# Patient Record
Sex: Female | Born: 1950 | Race: White | Hispanic: No | Smoking: Never smoker
Health system: Southern US, Community
[De-identification: ages and names within clinical notes are randomized; demographics above are authoritative.]

## PROBLEM LIST (undated history)

## (undated) DIAGNOSIS — F32A Depression, unspecified: Secondary | ICD-10-CM

## (undated) DIAGNOSIS — E785 Hyperlipidemia, unspecified: Secondary | ICD-10-CM

## (undated) DIAGNOSIS — G709 Myoneural disorder, unspecified: Secondary | ICD-10-CM

## (undated) DIAGNOSIS — D3A00 Benign carcinoid tumor of unspecified site: Secondary | ICD-10-CM

## (undated) DIAGNOSIS — E669 Obesity, unspecified: Secondary | ICD-10-CM

## (undated) DIAGNOSIS — F329 Major depressive disorder, single episode, unspecified: Secondary | ICD-10-CM

## (undated) DIAGNOSIS — Z85820 Personal history of malignant melanoma of skin: Secondary | ICD-10-CM

## (undated) DIAGNOSIS — D499 Neoplasm of unspecified behavior of unspecified site: Secondary | ICD-10-CM

## (undated) DIAGNOSIS — I10 Essential (primary) hypertension: Secondary | ICD-10-CM

## (undated) DIAGNOSIS — L409 Psoriasis, unspecified: Secondary | ICD-10-CM

## (undated) DIAGNOSIS — M1990 Unspecified osteoarthritis, unspecified site: Secondary | ICD-10-CM

## (undated) DIAGNOSIS — N200 Calculus of kidney: Secondary | ICD-10-CM

## (undated) DIAGNOSIS — H269 Unspecified cataract: Secondary | ICD-10-CM

## (undated) DIAGNOSIS — C801 Malignant (primary) neoplasm, unspecified: Secondary | ICD-10-CM

## (undated) HISTORY — DX: Unspecified osteoarthritis, unspecified site: M19.90

## (undated) HISTORY — DX: Personal history of malignant melanoma of skin: Z85.820

## (undated) HISTORY — DX: Calculus of kidney: N20.0

## (undated) HISTORY — PX: OOPHORECTOMY: SHX86

## (undated) HISTORY — DX: Unspecified cataract: H26.9

## (undated) HISTORY — DX: Neoplasm of unspecified behavior of unspecified site: D49.9

## (undated) HISTORY — DX: Myoneural disorder, unspecified: G70.9

## (undated) HISTORY — DX: Benign carcinoid tumor of unspecified site: D3A.00

## (undated) HISTORY — PX: APPENDECTOMY: SHX54

## (undated) HISTORY — PX: LUNG REMOVAL, PARTIAL: SHX233

## (undated) HISTORY — DX: Essential (primary) hypertension: I10

## (undated) HISTORY — DX: Depression, unspecified: F32.A

## (undated) HISTORY — DX: Psoriasis, unspecified: L40.9

## (undated) HISTORY — DX: Obesity, unspecified: E66.9

## (undated) HISTORY — PX: ABDOMINAL HYSTERECTOMY: SHX81

## (undated) HISTORY — DX: Hyperlipidemia, unspecified: E78.5

## (undated) HISTORY — DX: Major depressive disorder, single episode, unspecified: F32.9

---

## 1983-04-29 DIAGNOSIS — Z85820 Personal history of malignant melanoma of skin: Secondary | ICD-10-CM

## 1983-04-29 HISTORY — PX: MELANOMA EXCISION: SHX5266

## 1983-04-29 HISTORY — DX: Personal history of malignant melanoma of skin: Z85.820

## 1985-04-28 HISTORY — PX: LAPAROSCOPIC ASSISTED VAGINAL HYSTERECTOMY: SHX5398

## 1999-05-07 ENCOUNTER — Encounter: Admission: RE | Admit: 1999-05-07 | Discharge: 1999-05-07 | Payer: Self-pay | Admitting: Obstetrics and Gynecology

## 1999-05-07 ENCOUNTER — Encounter: Payer: Self-pay | Admitting: Obstetrics and Gynecology

## 2000-05-18 ENCOUNTER — Encounter: Admission: RE | Admit: 2000-05-18 | Discharge: 2000-05-18 | Payer: Self-pay | Admitting: Obstetrics and Gynecology

## 2000-05-18 ENCOUNTER — Encounter: Payer: Self-pay | Admitting: Obstetrics and Gynecology

## 2001-06-02 ENCOUNTER — Encounter: Payer: Self-pay | Admitting: Obstetrics and Gynecology

## 2001-06-02 ENCOUNTER — Encounter: Admission: RE | Admit: 2001-06-02 | Discharge: 2001-06-02 | Payer: Self-pay | Admitting: Obstetrics and Gynecology

## 2002-08-03 ENCOUNTER — Encounter: Admission: RE | Admit: 2002-08-03 | Discharge: 2002-08-03 | Payer: Self-pay | Admitting: Obstetrics and Gynecology

## 2002-08-03 ENCOUNTER — Encounter: Payer: Self-pay | Admitting: Obstetrics and Gynecology

## 2003-04-29 DIAGNOSIS — D3A00 Benign carcinoid tumor of unspecified site: Secondary | ICD-10-CM

## 2003-04-29 HISTORY — DX: Benign carcinoid tumor of unspecified site: D3A.00

## 2003-08-24 ENCOUNTER — Other Ambulatory Visit: Payer: Self-pay

## 2004-11-07 ENCOUNTER — Ambulatory Visit: Payer: Self-pay | Admitting: Obstetrics and Gynecology

## 2004-11-08 ENCOUNTER — Ambulatory Visit: Payer: Self-pay | Admitting: Internal Medicine

## 2004-12-10 ENCOUNTER — Ambulatory Visit: Payer: Self-pay | Admitting: Obstetrics and Gynecology

## 2005-04-28 HISTORY — PX: COLOSTOMY: SHX63

## 2005-09-05 ENCOUNTER — Ambulatory Visit: Payer: Self-pay | Admitting: Internal Medicine

## 2005-10-23 ENCOUNTER — Ambulatory Visit: Payer: Self-pay | Admitting: Gastroenterology

## 2005-11-12 ENCOUNTER — Ambulatory Visit: Payer: Self-pay | Admitting: Gastroenterology

## 2005-12-26 ENCOUNTER — Ambulatory Visit: Payer: Self-pay | Admitting: Family Medicine

## 2006-03-05 ENCOUNTER — Ambulatory Visit: Payer: Self-pay | Admitting: Obstetrics and Gynecology

## 2006-03-16 ENCOUNTER — Ambulatory Visit: Payer: Self-pay | Admitting: Internal Medicine

## 2006-06-08 ENCOUNTER — Ambulatory Visit: Payer: Self-pay | Admitting: Internal Medicine

## 2006-09-24 ENCOUNTER — Telehealth (INDEPENDENT_AMBULATORY_CARE_PROVIDER_SITE_OTHER): Payer: Self-pay | Admitting: *Deleted

## 2006-09-24 DIAGNOSIS — J984 Other disorders of lung: Secondary | ICD-10-CM

## 2006-09-25 ENCOUNTER — Ambulatory Visit: Payer: Self-pay | Admitting: Internal Medicine

## 2006-09-29 ENCOUNTER — Encounter (INDEPENDENT_AMBULATORY_CARE_PROVIDER_SITE_OTHER): Payer: Self-pay | Admitting: *Deleted

## 2006-12-09 ENCOUNTER — Ambulatory Visit: Payer: Self-pay | Admitting: Family Medicine

## 2006-12-09 DIAGNOSIS — G20 Parkinson's disease: Secondary | ICD-10-CM

## 2006-12-09 DIAGNOSIS — M545 Low back pain, unspecified: Secondary | ICD-10-CM | POA: Insufficient documentation

## 2006-12-10 ENCOUNTER — Telehealth (INDEPENDENT_AMBULATORY_CARE_PROVIDER_SITE_OTHER): Payer: Self-pay | Admitting: *Deleted

## 2007-03-12 ENCOUNTER — Ambulatory Visit: Payer: Self-pay

## 2007-03-14 ENCOUNTER — Emergency Department: Payer: Self-pay | Admitting: Emergency Medicine

## 2007-03-23 ENCOUNTER — Encounter: Payer: Self-pay | Admitting: Internal Medicine

## 2007-03-23 ENCOUNTER — Ambulatory Visit: Payer: Self-pay

## 2007-04-01 ENCOUNTER — Ambulatory Visit: Payer: Self-pay | Admitting: Obstetrics and Gynecology

## 2007-04-07 ENCOUNTER — Ambulatory Visit: Payer: Self-pay | Admitting: Pain Medicine

## 2007-04-15 ENCOUNTER — Ambulatory Visit: Payer: Self-pay | Admitting: Pain Medicine

## 2007-05-06 ENCOUNTER — Ambulatory Visit: Payer: Self-pay | Admitting: Physician Assistant

## 2007-05-25 ENCOUNTER — Ambulatory Visit: Payer: Self-pay | Admitting: Pain Medicine

## 2007-06-16 ENCOUNTER — Ambulatory Visit: Payer: Self-pay | Admitting: Physician Assistant

## 2008-01-17 ENCOUNTER — Ambulatory Visit: Payer: Self-pay | Admitting: Obstetrics & Gynecology

## 2008-09-16 ENCOUNTER — Ambulatory Visit: Payer: Self-pay | Admitting: Family Medicine

## 2008-09-16 DIAGNOSIS — F3340 Major depressive disorder, recurrent, in remission, unspecified: Secondary | ICD-10-CM

## 2008-09-16 DIAGNOSIS — R51 Headache: Secondary | ICD-10-CM

## 2008-09-16 DIAGNOSIS — F3341 Major depressive disorder, recurrent, in partial remission: Secondary | ICD-10-CM | POA: Insufficient documentation

## 2008-09-16 DIAGNOSIS — R300 Dysuria: Secondary | ICD-10-CM | POA: Insufficient documentation

## 2008-09-16 DIAGNOSIS — M542 Cervicalgia: Secondary | ICD-10-CM

## 2008-09-16 DIAGNOSIS — R519 Headache, unspecified: Secondary | ICD-10-CM | POA: Insufficient documentation

## 2008-09-16 LAB — CONVERTED CEMR LAB
Glucose, Urine, Semiquant: NEGATIVE
Ketones, urine, test strip: NEGATIVE
Nitrite: NEGATIVE
Protein, U semiquant: NEGATIVE
Specific Gravity, Urine: 1.015
Urobilinogen, UA: 0.2
pH: 6

## 2009-03-13 ENCOUNTER — Ambulatory Visit: Payer: Self-pay | Admitting: Family Medicine

## 2009-03-13 LAB — CONVERTED CEMR LAB
ALT: 28 units/L (ref 0–35)
AST: 24 units/L (ref 0–37)
Albumin: 4.3 g/dL (ref 3.5–5.2)
Alkaline Phosphatase: 69 units/L (ref 39–117)
BUN: 19 mg/dL (ref 6–23)
CO2: 26 meq/L (ref 19–32)
Calcium: 9.5 mg/dL (ref 8.4–10.5)
Chloride: 103 meq/L (ref 96–112)
Cholesterol: 226 mg/dL — ABNORMAL HIGH (ref 0–200)
Creatinine, Ser: 0.73 mg/dL (ref 0.40–1.20)
Glucose, Bld: 80 mg/dL (ref 70–99)
HCT: 46.8 % — ABNORMAL HIGH (ref 36.0–46.0)
HDL: 46 mg/dL (ref >39)
Hemoglobin: 15.4 g/dL — ABNORMAL HIGH (ref 12.0–15.0)
LDL Cholesterol: 156 mg/dL — ABNORMAL HIGH (ref 0–99)
MCHC: 32.9 g/dL (ref 30.0–36.0)
MCV: 91.8 fL (ref 78.0–100.0)
Platelets: 263 10*3/uL (ref 150–400)
Potassium: 3.9 meq/L (ref 3.5–5.3)
RBC: 5.1 M/uL (ref 3.87–5.11)
RDW: 13.6 % (ref 11.5–15.5)
Sodium: 142 meq/L (ref 135–145)
TSH: 1.678 microintl units/mL (ref 0.350–4.500)
Total Bilirubin: 0.4 mg/dL (ref 0.3–1.2)
Total CHOL/HDL Ratio: 4.9
Total Protein: 7.3 g/dL (ref 6.0–8.3)
Triglycerides: 121 mg/dL (ref <150)
VLDL: 24 mg/dL (ref 0–40)
WBC: 8.8 10*3/uL (ref 4.0–10.5)

## 2009-03-28 ENCOUNTER — Ambulatory Visit: Payer: Self-pay

## 2009-12-27 ENCOUNTER — Ambulatory Visit: Payer: Self-pay | Admitting: Family Medicine

## 2009-12-27 DIAGNOSIS — J069 Acute upper respiratory infection, unspecified: Secondary | ICD-10-CM | POA: Insufficient documentation

## 2010-04-18 ENCOUNTER — Ambulatory Visit: Payer: Self-pay

## 2010-04-23 ENCOUNTER — Ambulatory Visit
Admission: RE | Admit: 2010-04-23 | Discharge: 2010-04-23 | Payer: Self-pay | Source: Home / Self Care | Attending: Obstetrics & Gynecology | Admitting: Obstetrics & Gynecology

## 2010-04-23 ENCOUNTER — Ambulatory Visit: Payer: Self-pay | Admitting: Obstetrics & Gynecology

## 2010-05-30 NOTE — Assessment & Plan Note (Signed)
Summary: sinus infection/alc   Vital Signs:  Patient profile:   60 year old female Weight:      204 pounds Temp:     98.7 degrees F oral Pulse rate:   84 / minute Pulse rhythm:   regular BP sitting:   142 / 90  (left arm) Cuff size:   large  Vitals Entered By: Delilah Shan CMA Duncan Dull) (December 27, 2009 2:33 PM) CC: ? sinus infection   History of Present Illness: Facial pain, nose is irritated.  Ears feel plugged.  Going on a few days, less than a week.  Aching more than normal.  No fevers.  Nighttime cough.  No vomiting.  No rash.  Took NSAIDS at baseline for h/o frequent aches.   Current Medications (verified): 1)  Hydrochlorothiazide 25 Mg  Tabs (Hydrochlorothiazide) .... Take 1 Tablet By Mouth Once A Day 2)  Wellbutrin Xl 150 Mg  Tb24 (Bupropion Hcl) .... Take 1 Tablet By Mouth Once A Day 3)  Estrace 0.1 Mg/gm  Crea (Estradiol) .... Take 1 Tablet By Mouth Once A Day 4)  Adult Aspirin Low Strength 81 Mg  Tbdp (Aspirin) .... Take 1 Tablet By Mouth Once A Day  Allergies: 1)  Augmentin  Social History: Reviewed history and no changes required. 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   Review of Systems       See HPI.  Otherwise negative.    Physical Exam  General:  GEN: nad, alert and oriented HEENT: mucous membranes moist, TM w/o erythema, nasal epithelium injected, OP with cobblestoning NECK: supple w/o LA CV: rrr. PULM: ctab, no inc wob ABD: soft, +bs EXT: no edema  SKIN: psoriasis plaques noted, at baseline.    Impression & Recommendations:  Problem # 1:  URI (ICD-465.9) Likely viral.  Short duration w/o fever, nontoxic and prevalent in the community recently.  1 sample of veramyst given to patient.  1-2 sprays per nostril per day.  nasal saline and rest in meantime.  OTC NSAIDS in meantime.  Call back as needed.  I would expect this to gradually resolve.  No indication for antibiotics currently.  Her updated medication list for this problem includes:  Adult Aspirin Low Strength 81 Mg Tbdp (Aspirin) .Marland Kitchen... Take 1 tablet by mouth once a day  Complete Medication List: 1)  Hydrochlorothiazide 25 Mg Tabs (Hydrochlorothiazide) .... Take 1 tablet by mouth once a day 2)  Wellbutrin Xl 150 Mg Tb24 (Bupropion hcl) .... Take 1 tablet by mouth once a day 3)  Estrace 0.1 Mg/gm Crea (Estradiol) .... Take 1 tablet by mouth once a day 4)  Adult Aspirin Low Strength 81 Mg Tbdp (Aspirin) .... Take 1 tablet by mouth once a day  Patient Instructions: 1)  Use nasal saline as needed and try the veramyst 1-2 sprays per nostril per day.  Call back as needed. Try to get some rest. 2)  Stay out of work until the aches and congestion resolve.  Potentially contagious.   Current Allergies (reviewed today): AUGMENTIN

## 2010-09-10 NOTE — Assessment & Plan Note (Signed)
Regina Chapman, [place]               ACCOUNT NO.:  [id number]   MEDICAL RECORD NO.:  [id number]          PATIENT TYPE:  POB   LOCATION:  [facility] at [facility]         FACILITY:  [facility]   PHYSICIAN:  Allie Bossier, MD        DATE OF BIRTH:  [birth year]   DATE OF SERVICE:                                  CLINIC NOTE   Regina Chapman is a 60 year old married white gravida 1, para 1 with a 81-  year-old son.  She comes in here for her annual exam.  She currently  denies GYN complaints.   MEDICATIONS:  Estrace one a day, Wellbutrin one a day,  hydrochlorothiazide 25 mg daily and clorazepate one as necessary.  She  also takes fish oil daily.   PAST MEDICAL HISTORY:  History of melanoma and excision in 1985,  obesity, depression, history of kidney stones, psoriasis and she tells  me that her lipids generally run high, although she has never been  treated.   REVIEW OF SYSTEMS:  She is married for 34 years.  She has not been  sexually active for years due to a disability on her husband's part  after a stroke approximately 10 years ago.  She works at [employer].  Her Pap smears always have been normal, mammograms most recently have  been normal and she had a colonoscopy negative done in 2007.   PAST SURGICAL HISTORY:  Melanoma excision, LAVH/BSO in 1987, right  partial lobectomy of her right lung for benign tumor, appendectomy.   FAMILY HISTORY:  Negative for breast and colon malignancies to her  sister.  She thinks has cervical cancer.   ALLERGIES:  She does not have any latex allergies.  She says that  AUGMENTIN makes her nauseated and some medicines use for inflammation  cause her face to swell.   PHYSICAL EXAMINATION:  VITAL SIGNS:  Weight 190 pounds, height 5 feet 3  inches, blood pressure 139/91, pulse 83.  HEENT:  Normal.  BREAST:  Normal.  ABDOMEN:  Obese, benign.  No hepatosplenomegaly.  EXTERNAL GENITALIA:  No lesions.  Vagina and vaginal cuff, no lesions.  Bimanual exam, no  masses.   ASSESSMENT AND PLAN:  Annual exam.  Recommended self-breast and self  vulvar exams monthly.  Certainly discussed weight loss and suggested  small frequent meals with oatmeal in the morning.  With a history of her  lipids  being high, we will check lipids today (she only had a small breakfast  this morning).  She will follow up in a year or as necessary.      Allie Bossier, MD     MCD/MEDQ  D:  01/17/2008  T:  01/18/2008  Job:  [id number]

## 2010-09-10 NOTE — Assessment & Plan Note (Signed)
NAMEJEANINNE, Regina Chapman               ACCOUNT NO.:  [id number]   MEDICAL RECORD NO.:  [id number]          PATIENT TYPE:  POB   LOCATION:  [facility]         FACILITY:  [facility]   PHYSICIAN:  Tinnie Gens, MD        DATE OF BIRTH:  [birth year]   DATE OF SERVICE:  03/13/2009                                  CLINIC NOTE   CHIEF COMPLAINT:  Yearly exam.   HISTORY OF PRESENT ILLNESS:  The patient is a 60 year old gravida 1,  para 1 who comes in today for her annual exam.  She has a primary care  physician, Dr. Alphonsus Sias, who she has not seen for a while.  She states  that this has been a particularly rough year and that she had a sister  who died, another sister with surgery, and aunt who is currently in the  ICU with kidney failure.  Her husband has had a fairly significant  stroke, who she cares for.  He had a heart attack in the last year as  well.  She has no history of abnormal Pap smears.  Her last mammography  was 2 years ago and was normal.  She has had a normal colonoscopy in  2007.  She has no significant complaints.  She has undergone a LAVH and  BSO for bleeding, pain, and endometriosis.   PAST MEDICAL HISTORY:  She had melanoma excised in 1985.  She has  obesity, depression, nephrolithiasis, psoriasis, hypertension, and  hypercholesterolemia.  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  She sees  a pulmonologist for followup of this, it will be 5 years this December.   PAST SURGICAL HISTORY:  Melanoma excision, LAVH-BSO in 1987, right  partial lobectomy in 2005, appendectomy.   MEDICATIONS:  1. Estrace 1 mg p.o. daily.  2. Wellbutrin XL 150 mg one p.o. daily.  3. Hydrochlorothiazide 25 mg one p.o. daily.  4. Clonazepam one p.o. p.r.n.   ALLERGIES:  AUGMENTIN.   FAMILY HISTORY:  Sister died of lung cancer.  She is a long-term smoker.  Possibly cervical cancer in another sister and no breast cancer or  colon  cancer.   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  She  denies tobacco.  She is a social alcohol user.   REVIEW OF SYSTEMS:  Negative for headache, vision changes, shortness of  breath, chest pain, nausea, vomiting, diarrhea, constipation, fevers,  chills, breast masses.  The patient does report increasing feelings of  sadness.  Although, I think this is a normal grief reaction, her sister  died in 12-Nov-2022.   PHYSICAL EXAMINATION:  VITAL SIGNS:  As noted in chart.  She is 201  pounds, blood pressure is 152/101 today.  HEENT:  Normocephalic, atraumatic.  Sclerae are anicteric.  NECK:  Supple.  Normal thyroid.  LUNGS:  Clear bilaterally.  CV:  Regular rate and rhythm without rubs.  ABDOMEN:  Soft, nontender, nondistended.  EXTREMITIES:  No cyanosis, clubbing, or edema.  BREASTS:  Symmetric with  everted nipples.  No masses or supraclavicular  or axillary adenopathy.  GU:  Normal external female genitalia.  BUS is normal.  Vagina is pink  and rugated.  Vaginal cuff is without lesion.  The uterus, cervix, and  adnexa are removed.  There is no mass on bimanual exam.   IMPRESSION:  1. GYN exam.  2. Obesity.  3. Depression.  4. Elevated blood pressure.  5. History of elevated lipids, last checked here showed her      cholesterol to be 240.   PLAN:  1. CBC, CMP, TSH, and lipid panel today.  2. Mammogram today.  3. I have advised the patient to keep an eye on her blood pressure.      If it continues to be high, I would recommend follow with her      primary care physician, Dr. Alphonsus Sias, who also should plan to follow      her up if her lipids are elevated again this year.  Suspect a lot      of this brief action of weight gain, has had a poor impact on blood      pressure control as well as lipid profile, but we will await labs      to see this.            ______________________________  Tinnie Gens, MD     TP/MEDQ  D:  03/13/2009  T:  03/14/2009  Job:  [id number]

## 2010-09-10 NOTE — Assessment & Plan Note (Signed)
Regina Chapman, Regina Chapman               ACCOUNT NO.:  [id number]   MEDICAL RECORD NO.:  [id number]          PATIENT TYPE:  POB   LOCATION:  [facility]         FACILITY:  [facility]   PHYSICIAN:  Allie Bossier, MD        DATE OF BIRTH:  1951-03-15   DATE OF SERVICE:  04/23/2010                                  CLINIC NOTE   HISTORY OF PRESENT ILLNESS:  Regina Chapman is a 60 year old married white  G1, P1 who comes in for annual exam.  Regina has no particular GYN  complaints today.  Regina would like a refill on Chapman p.o. Estrace, Chapman  Wellbutrin, and Chapman hydrochlorothiazide.  I have agreed to do this and  gave Chapman 3 months prescription with a year's worth of refills.   PAST MEDICAL HISTORY:  1. History of melanoma excision in 1985.  2. Obesity.  3. Depression.  4. History of kidney stones.  5. Psoriasis.  6. Elevated lipids.  7. Carcinoid tumor syndrome.  8. Hypertension.   PAST SURGICAL HISTORY:  Regina had a melanoma excision.  Regina had a right  partial lobectomy in 2005.  Regina had LAVH/BSO in 1987 for endometriosis.   FAMILY HISTORY:  Negative for breast and colon malignancy but a sister  had cervical cancer.   SOCIAL HISTORY:  Regina reports social alcohol but denies tobacco or drug  use.   ALLERGIES:  No latex allergies.   DRUG ALLERGIES:  Some INFLAMMATION DRUGS, Regina does not remember which  one.   REVIEW OF SYSTEMS:  Regina has been married for 36 years.  Regina Chapman  husband are abstinent due to his health issues.  Regina had a negative  mammogram last month.  Chapman colonoscopy was done in 2007.  Regina says it  was normal and will be due in 2017.   MEDICATIONS:  1. Estrace 1 mg daily.  2. Wellbutrin XL 150 mg daily.  3. Hydrochlorothiazide 25 mg daily.  4. Fish oil.  5. Aspirin 81 mg.  6. Aleve p.r.n.  7. Clorazepate on a p.r.n. basis.   PHYSICAL EXAMINATION:  GENERAL:  Well-nourished, well-hydrated very  pleasant white female.  VITAL SIGNS:  Height 5 feet 3 inches, weight 202  pounds, blood pressure  155/93, and pulse 75.  HEENT:  Normal.  BREASTS:  Normal bilaterally.  ABDOMEN:  Slightly obese.  No palpable hepatosplenomegaly.  LUNGS:  Clear to auscultation bilaterally.  EXTERNAL GENITALIA:  Relatively well estrogenized.  No lesions.  Regina has  a small varicose vein on Chapman right labia majora.  Chapman vaginal cuff is  well-healed.  There is no evidence of any vaginal dysplasia.  Bimanual  exam reveals no masses and has a nontender exam.   ASSESSMENT AND PLAN:  Annual exam.  I have not checked a Pap smear due  to Chapman history of benign indications for hysterectomy.  I have recommend  self-breast and self-vulvar exams.  I have refilled Chapman prescriptions as  requested.  I have recommended weight loss and today we will check Chapman  fasting lipids.      Allie Bossier, MD     MCD/MEDQ  D:  04/23/2010  T:  04/24/2010  Job:  [id number]

## 2010-10-02 ENCOUNTER — Encounter: Payer: Self-pay | Admitting: Internal Medicine

## 2010-10-02 ENCOUNTER — Ambulatory Visit (INDEPENDENT_AMBULATORY_CARE_PROVIDER_SITE_OTHER): Payer: BC Managed Care – PPO | Admitting: Internal Medicine

## 2010-10-02 VITALS — BP 140/90 | HR 76 | Temp 98.9°F | Wt 199.0 lb

## 2010-10-02 DIAGNOSIS — R300 Dysuria: Secondary | ICD-10-CM

## 2010-10-02 DIAGNOSIS — J069 Acute upper respiratory infection, unspecified: Secondary | ICD-10-CM

## 2010-10-02 DIAGNOSIS — Z0000 Encounter for general adult medical examination without abnormal findings: Secondary | ICD-10-CM

## 2010-10-02 LAB — POCT URINALYSIS DIPSTICK
Glucose, UA: NEGATIVE
Leukocytes, UA: NEGATIVE
Nitrite, UA: NEGATIVE
Urobilinogen, UA: 0.2

## 2010-10-02 LAB — LIPID PANEL
Cholesterol: 217 mg/dL — ABNORMAL HIGH (ref 0–200)
Triglycerides: 145 mg/dL (ref 0.0–149.0)

## 2010-10-02 LAB — GLUCOSE, RANDOM: Glucose, Bld: 83 mg/dL (ref 70–99)

## 2010-10-02 MED ORDER — AMOXICILLIN 500 MG PO TABS: 1000.0000 mg | ORAL_TABLET | Freq: Two times a day (BID) | ORAL | Status: DC

## 2010-10-02 NOTE — Patient Instructions (Signed)
Please start the amoxicillin antibiotic if you have worsening pain during urination or if your sinus symptoms don't go away in the next week or so

## 2010-10-02 NOTE — Assessment & Plan Note (Signed)
Clearly has sinus symptoms  Discussed that this is likely viral Reviewed supportive care Start amoxil if worsens or persists

## 2010-10-02 NOTE — Assessment & Plan Note (Signed)
May have early cystitis U/A benign Will treat with amoxicillin if symptoms worsens

## 2010-10-02 NOTE — Progress Notes (Signed)
  Subjective:    Patient ID: Regina Chapman, female    DOB: Dec 15, 1950, 60 y.o.   MRN: [id number]  HPI Has sinus infection Face hurts and has had headache for 3-4 days Started ~4 days ago Has ear congestion and some pain  No fever Thick nasal drainage Some cough due to PND Some sore throat No SOB  Aleve and tylenol has helped the headache temporarily  Has had some burning dysuria Achy in low back No urgency Has noted some nocturia over the past couple of months  No current outpatient prescriptions on file prior to visit.   Past Medical History  Diagnosis Date  . Depression   . Headache     No past surgical history on file.  No family history on file.  History   Social History  . Marital Status: Married    Spouse Name: N/A    Number of Children: N/A  . Years of Education: N/A   Occupational History  . Not on file.   Social History Main Topics  . Smoking status: Never Smoker   . Smokeless tobacco: Not on file  . Alcohol Use: Not on file  . Drug Use: Not on file  . Sexually Active: Not on file   Other Topics Concern  . Not on file   Social History Narrative  . No narrative on file    Review of Systems No nausea or vomiting  Appetite is okay    Objective:   Physical Exam  Constitutional: She appears well-developed and well-nourished. No distress.  HENT:  Mouth/Throat: Oropharynx is clear and moist. No oropharyngeal exudate.       Maxillary>frontal sinus tenderness Mild nasal inflammation TMs normal  Neck: Normal range of motion. Neck supple. No thyromegaly present.  Pulmonary/Chest: Effort normal and breath sounds normal. No respiratory distress. She has no wheezes. She has no rales.  Abdominal: Soft. There is no tenderness.  Musculoskeletal:       No CVA tenderness  Lymphadenopathy:    She has no cervical adenopathy.          Assessment & Plan:

## 2011-04-29 HISTORY — PX: CATARACT EXTRACTION W/ INTRAOCULAR LENS  IMPLANT, BILATERAL: SHX1307

## 2011-06-05 ENCOUNTER — Ambulatory Visit (INDEPENDENT_AMBULATORY_CARE_PROVIDER_SITE_OTHER): Payer: BC Managed Care – PPO | Admitting: Obstetrics & Gynecology

## 2011-06-05 ENCOUNTER — Encounter: Payer: Self-pay | Admitting: Obstetrics & Gynecology

## 2011-06-05 VITALS — BP 148/80 | HR 77 | Ht 63.0 in | Wt 199.0 lb

## 2011-06-05 DIAGNOSIS — Z01419 Encounter for gynecological examination (general) (routine) without abnormal findings: Secondary | ICD-10-CM

## 2011-06-05 DIAGNOSIS — Z0000 Encounter for general adult medical examination without abnormal findings: Secondary | ICD-10-CM

## 2011-06-05 MED ORDER — HYDROCHLOROTHIAZIDE 25 MG PO TABS: 25.0000 mg | ORAL_TABLET | Freq: Every day | ORAL | Status: DC

## 2011-06-05 MED ORDER — ESTRADIOL 0.5 MG PO TABS: 0.5000 mg | ORAL_TABLET | Freq: Every day | ORAL | Status: DC

## 2011-06-05 MED ORDER — BUPROPION HCL ER (XL) 150 MG PO TB24: 150.0000 mg | ORAL_TABLET | Freq: Every day | ORAL | Status: DC

## 2011-06-05 NOTE — Progress Notes (Signed)
Patient is here for routine yearly.  She has recently been seeing a urologist for possible kidney stones.  She has an appointment for a scan to be sure if this is what is going on.

## 2011-06-05 NOTE — Progress Notes (Signed)
Subjective:    Regina Chapman is a 61 y.o. female who presents for an annual exam. The patient has no complaints today. She wants refills on her meds. The patient is not currently sexually active due to her husband's health status. GYN screening history: last pap: was normal. The patient wears seatbelts: yes. The patient participates in regular exercise: no. Has the patient ever been transfused or tattooed?: no. The patient reports that there is not domestic violence in her life.   Menstrual History: OB History    Grav Para Term Preterm Abortions TAB SAB Ect Mult Living   1 1              Menarche age: 80 No LMP recorded. Patient has had a hysterectomy.    The following portions of the patient's history were reviewed and updated as appropriate: allergies, current medications, past family history, past medical history, past social history, past surgical history and problem list.  Review of Systems A comprehensive review of systems was negative. Married for 37 years, works at [employer], would like to marry Leonarda Salon, has a son 110 years old, 10 month old grandson.   Objective:    BP 148/80  Pulse 77  Ht 5\' 3"  (1.6 m)  Wt 199 lb (90.266 kg)  BMI 35.25 kg/m2  General Appearance:    Alert, cooperative, no distress, appears stated age  Head:    Normocephalic, without obvious abnormality, atraumatic  Eyes:    PERRL, conjunctiva/corneas clear, EOM's intact, fundi    benign, both eyes  Ears:    Normal TM's and external ear canals, both ears  Nose:   Nares normal, septum midline, mucosa normal, no drainage    or sinus tenderness  Throat:   Lips, mucosa, and tongue normal; teeth and gums normal  Neck:   Supple, symmetrical, trachea midline, no adenopathy;    thyroid:  no enlargement/tenderness/nodules; no carotid   bruit or JVD  Back:     Symmetric, no curvature, ROM normal, no CVA tenderness  Lungs:     Clear to auscultation bilaterally, respirations unlabored    Chest Wall:    No tenderness or deformity   Heart:    Regular rate and rhythm, S1 and S2 normal, no murmur, rub   or gallop  Breast Exam:    No tenderness, masses, or nipple abnormality  Abdomen:     Soft, non-tender, bowel sounds active all four quadrants,    no masses, no organomegaly  Genitalia:    Normal female without lesion, discharge or tenderness, moderate atrophy, no lesions, no pelvic masses     Extremities:   Extremities normal, atraumatic, no cyanosis or edema  Pulses:   2+ and symmetric all extremities  Skin:   Skin color, texture, turgor normal, no rashes or lesions  Lymph nodes:   Cervical, supraclavicular, and axillary nodes normal  Neurologic:   CNII-XII intact, normal strength, sensation and reflexes    throughout  .    Assessment:    Healthy female exam.    Plan:     Mammogram.

## 2011-06-19 ENCOUNTER — Ambulatory Visit: Payer: Self-pay | Admitting: Obstetrics & Gynecology

## 2011-07-03 ENCOUNTER — Ambulatory Visit: Payer: Self-pay | Admitting: Obstetrics & Gynecology

## 2011-07-21 ENCOUNTER — Encounter: Payer: Self-pay | Admitting: Obstetrics & Gynecology

## 2011-07-23 ENCOUNTER — Encounter: Payer: Self-pay | Admitting: Obstetrics & Gynecology

## 2011-11-04 ENCOUNTER — Encounter: Payer: Self-pay | Admitting: Family Medicine

## 2011-11-04 ENCOUNTER — Ambulatory Visit (INDEPENDENT_AMBULATORY_CARE_PROVIDER_SITE_OTHER): Payer: BC Managed Care – PPO | Admitting: Family Medicine

## 2011-11-04 VITALS — BP 142/90 | HR 68 | Temp 98.2°F | Wt 195.0 lb

## 2011-11-04 DIAGNOSIS — J069 Acute upper respiratory infection, unspecified: Secondary | ICD-10-CM

## 2011-11-04 MED ORDER — HYDROCOD POLST-CHLORPHEN POLST 10-8 MG/5ML PO LQCR: 5.0000 mL | Freq: Two times a day (BID) | ORAL | Status: DC | PRN

## 2011-11-04 MED ORDER — AMOXICILLIN 500 MG PO TABS: 1000.0000 mg | ORAL_TABLET | Freq: Two times a day (BID) | ORAL | Status: AC

## 2011-11-04 NOTE — Progress Notes (Signed)
SUBJECTIVE:  Regina Chapman is a 61 y.o. female who complains of coryza, congestion, sneezing, sore throat, dry cough, myalgias and headache for 3 days. She denies a history of anorexia, chest pain, fatigue, fevers, nausea and shortness of breath and denies a history of asthma. Patient denies smoke cigarettes.   Patient Active Problem List  Diagnosis  . DEPRESSION  . PARKINSON'S  . URI  . DISEASE, LUNG, OTHER NEC  . NECK PAIN  . BACK PAIN, LUMBAR  . HEADACHE  . DYSURIA   Past Medical History  Diagnosis Date  . Depression   . Headache   . History of melanoma 1985  . Hypertension   . Kidney stones   . Psoriasis   . Carcinoid tumor    Past Surgical History  Procedure Date  . Melanoma excision 1985  . Lung removal, partial   . Laparoscopic assisted vaginal hysterectomy 1987    FOR ENDOMETRIOSIS   History  Substance Use Topics  . Smoking status: Never Smoker   . Smokeless tobacco: Not on file  . Alcohol Use: Yes     3 PER WEEK   Family History  Problem Relation Age of Onset  . Cancer Sister     CERVICAL    Allergies  Allergen Reactions  . Amoxicillin-Pot Clavulanate     REACTION: GI--not allergy (tolerates Amoxil)   Current Outpatient Prescriptions on File Prior to Visit  Medication Sig Dispense Refill  . aspirin 81 MG tablet Take 81 mg by mouth daily.        Marland Kitchen buPROPion (WELLBUTRIN XL) 150 MG 24 hr tablet Take 1 tablet (150 mg total) by mouth daily.  31 tablet  12  . CLORAZEPATE DIPOTASSIUM PO Take by mouth.      . estradiol (ESTRACE) 0.5 MG tablet Take 1 tablet (0.5 mg total) by mouth daily.  31 tablet  12  . fish oil-omega-3 fatty acids 1000 MG capsule Take 2 g by mouth daily.      . hydrochlorothiazide (HYDRODIURIL) 25 MG tablet Take 1 tablet (25 mg total) by mouth daily.  31 tablet  12  . Potassium Chloride (KLOR-CON PO) Take 2 by mouth in the morning and 2 in the evening- pt unsure of strength       The PMH, PSH, Social History, Family History, Medications,  and allergies have been reviewed in [facility], and have been updated if relevant.  OBJECTIVE: BP 142/90  Pulse 68  Temp 98.2 F (36.8 C)  Wt 195 lb (88.451 kg)  She appears well, vital signs are as noted. Ears normal.  Throat and pharynx normal.  Neck supple. No adenopathy in the neck. Nose is congested. Sinuses non tender. The chest is clear, without wheezes or rales.  ASSESSMENT:  viral upper respiratory illness  PLAN: Symptomatic therapy suggested: push fluids, rest and return office visit prn if symptoms persist or worsen. Lack of antibiotic effectiveness discussed with her. Call or return to clinic prn if these symptoms worsen or fail to improve as anticipated.  Printed out rx for amoxicillin to fill if symptoms progress or do not improve as anticipated.

## 2011-11-04 NOTE — Patient Instructions (Addendum)
Drink lots of fluids.  Treat sympotmatically with Mucinex, nasal saline irrigation, and Tylenol/Ibuprofen. Also try claritin D or zyrtec D over the counter- two times a day as needed ( have to sign for them at pharmacy). You can use warm compresses.  Cough suppressant at night.  Call me with an update in next couple of days.

## 2012-06-07 ENCOUNTER — Other Ambulatory Visit: Payer: Self-pay | Admitting: Obstetrics & Gynecology

## 2012-07-12 ENCOUNTER — Encounter: Payer: Self-pay | Admitting: Obstetrics & Gynecology

## 2012-07-12 ENCOUNTER — Ambulatory Visit (INDEPENDENT_AMBULATORY_CARE_PROVIDER_SITE_OTHER): Payer: BC Managed Care – PPO | Admitting: Obstetrics & Gynecology

## 2012-07-12 VITALS — BP 167/82 | HR 81 | Ht 63.0 in | Wt 200.0 lb

## 2012-07-12 DIAGNOSIS — Z01419 Encounter for gynecological examination (general) (routine) without abnormal findings: Secondary | ICD-10-CM

## 2012-07-12 DIAGNOSIS — Z0000 Encounter for general adult medical examination without abnormal findings: Secondary | ICD-10-CM

## 2012-07-12 MED ORDER — BUPROPION HCL ER (XL) 150 MG PO TB24: 150.0000 mg | ORAL_TABLET | Freq: Every day | ORAL | Status: DC

## 2012-07-12 MED ORDER — ESTRADIOL 0.5 MG PO TABS: 0.5000 mg | ORAL_TABLET | Freq: Every day | ORAL | Status: DC

## 2012-07-12 MED ORDER — HYDROCHLOROTHIAZIDE 25 MG PO TABS: 25.0000 mg | ORAL_TABLET | Freq: Every day | ORAL | Status: DC

## 2012-07-12 NOTE — Progress Notes (Signed)
Subjective:    Regina Chapman is a 62 y.o. female who presents for an annual exam. The patient has no complaints today. She would like refills on her meds. The patient is not currently sexually active. GYN screening history: last pap: was normal. The patient wears seatbelts: yes. The patient participates in regular exercise: no but she wants to and just joined [employer]. Has the patient ever been transfused or tattooed?: no. The patient reports that there is not domestic violence in her life.   Menstrual History: OB History   Grav Para Term Preterm Abortions TAB SAB Ect Mult Living   1 1              Menarche age: 58  No LMP recorded. Patient has had a hysterectomy.    The following portions of the patient's history were reviewed and updated as appropriate: allergies, current medications, past family history, past medical history, past social history, past surgical history and problem list.  Review of Systems A comprehensive review of systems was negative. She has been married for 38 years. She watches her 87 month old grandson. She was laid off from her job. Her colonoscopy and mammogram are due.   Objective:    BP 167/82  Pulse 81  Ht 5\' 3"  (1.6 m)  Wt 200 lb (90.719 kg)  BMI 35.44 kg/m2  General Appearance:    Alert, cooperative, no distress, appears stated age  Head:    Normocephalic, without obvious abnormality, atraumatic  Eyes:    PERRL, conjunctiva/corneas clear, EOM's intact, fundi    benign, both eyes  Ears:    Normal TM's and external ear canals, both ears  Nose:   Nares normal, septum midline, mucosa normal, no drainage    or sinus tenderness  Throat:   Lips, mucosa, and tongue normal; teeth and gums normal  Neck:   Supple, symmetrical, trachea midline, no adenopathy;    thyroid:  no enlargement/tenderness/nodules; no carotid   bruit or JVD  Back:     Symmetric, no curvature, ROM normal, no CVA tenderness  Lungs:     Clear to auscultation bilaterally,  respirations unlabored  Chest Wall:    No tenderness or deformity   Heart:    Regular rate and rhythm, S1 and S2 normal, no murmur, rub   or gallop  Breast Exam:    No tenderness, masses, or nipple abnormality  Abdomen:     Soft, non-tender, bowel sounds active all four quadrants,    no masses, no organomegaly  Genitalia:    Normal female without lesion, discharge or tenderness, minimal atrophy, minimal descensus of vaginal vault     Extremities:   Extremities normal, atraumatic, no cyanosis or edema  Pulses:   2+ and symmetric all extremities  Skin:   Skin color, texture, turgor normal, no rashes or lesions  Lymph nodes:   Cervical, supraclavicular, and axillary nodes normal  Neurologic:   CNII-XII intact, normal strength, sensation and reflexes    throughout  .    Assessment:    Healthy female exam.  HTN Obesity   Plan:     Mammogram. GI referral for colonoscopy  RTC for a BP check in a month. I refilled her meds Rec weight loss

## 2012-07-13 ENCOUNTER — Other Ambulatory Visit: Payer: Self-pay | Admitting: Obstetrics & Gynecology

## 2012-07-23 ENCOUNTER — Ambulatory Visit (INDEPENDENT_AMBULATORY_CARE_PROVIDER_SITE_OTHER): Payer: BC Managed Care – PPO | Admitting: Internal Medicine

## 2012-07-23 ENCOUNTER — Encounter: Payer: Self-pay | Admitting: Internal Medicine

## 2012-07-23 VITALS — BP 130/80 | HR 82 | Temp 98.6°F | Wt 197.0 lb

## 2012-07-23 DIAGNOSIS — J019 Acute sinusitis, unspecified: Secondary | ICD-10-CM | POA: Insufficient documentation

## 2012-07-23 DIAGNOSIS — M7550 Bursitis of unspecified shoulder: Secondary | ICD-10-CM | POA: Insufficient documentation

## 2012-07-23 DIAGNOSIS — M7551 Bursitis of right shoulder: Secondary | ICD-10-CM

## 2012-07-23 DIAGNOSIS — M75100 Unspecified rotator cuff tear or rupture of unspecified shoulder, not specified as traumatic: Secondary | ICD-10-CM

## 2012-07-23 MED ORDER — AMOXICILLIN 500 MG PO TABS: 1000.0000 mg | ORAL_TABLET | Freq: Two times a day (BID) | ORAL | Status: DC

## 2012-07-23 MED ORDER — FLUTICASONE PROPIONATE 50 MCG/ACT NA SUSP: 2.0000 | Freq: Every day | NASAL | Status: DC

## 2012-07-23 NOTE — Assessment & Plan Note (Signed)
Recurrence from recent cold Will try amoxicillin Ongoing congestion--rx for fluticasone spray

## 2012-07-23 NOTE — Assessment & Plan Note (Signed)
From injury lifting garage door Should get better Discussed ice, NSAIDs Consider injection

## 2012-07-23 NOTE — Patient Instructions (Signed)
If your shoulder is not better in 2-3 weeks, you can call for a steroid injection
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
## 2012-08-24 ENCOUNTER — Ambulatory Visit: Payer: Self-pay | Admitting: Obstetrics & Gynecology

## 2012-10-23 ENCOUNTER — Other Ambulatory Visit: Payer: Self-pay | Admitting: Internal Medicine

## 2013-02-04 ENCOUNTER — Encounter: Payer: BC Managed Care – PPO | Admitting: Internal Medicine

## 2013-02-09 ENCOUNTER — Encounter: Payer: Self-pay | Admitting: Internal Medicine

## 2013-02-09 ENCOUNTER — Ambulatory Visit (INDEPENDENT_AMBULATORY_CARE_PROVIDER_SITE_OTHER): Payer: BC Managed Care – PPO | Admitting: Internal Medicine

## 2013-02-09 VITALS — BP 120/80 | HR 85 | Temp 98.5°F | Ht 63.0 in | Wt 200.0 lb

## 2013-02-09 DIAGNOSIS — E785 Hyperlipidemia, unspecified: Secondary | ICD-10-CM

## 2013-02-09 DIAGNOSIS — Z23 Encounter for immunization: Secondary | ICD-10-CM

## 2013-02-09 DIAGNOSIS — Z0000 Encounter for general adult medical examination without abnormal findings: Secondary | ICD-10-CM

## 2013-02-09 DIAGNOSIS — F329 Major depressive disorder, single episode, unspecified: Secondary | ICD-10-CM

## 2013-02-09 DIAGNOSIS — I10 Essential (primary) hypertension: Secondary | ICD-10-CM

## 2013-02-09 LAB — BASIC METABOLIC PANEL
BUN: 22 mg/dL (ref 6–23)
CO2: 28 mEq/L (ref 19–32)
Calcium: 9.6 mg/dL (ref 8.4–10.5)
Chloride: 103 mEq/L (ref 96–112)
Creatinine, Ser: 0.7 mg/dL (ref 0.4–1.2)
GFR: 88.37 mL/min (ref >60.00)
Glucose, Bld: 109 mg/dL — ABNORMAL HIGH (ref 70–99)
Potassium: 3.9 mEq/L (ref 3.5–5.1)
Sodium: 139 mEq/L (ref 135–145)

## 2013-02-09 LAB — HEPATIC FUNCTION PANEL
ALT: 43 U/L — ABNORMAL HIGH (ref 0–35)
Alkaline Phosphatase: 70 U/L (ref 39–117)
Bilirubin, Direct: 0.1 mg/dL (ref 0.0–0.3)
Total Bilirubin: 0.5 mg/dL (ref 0.3–1.2)
Total Protein: 7.9 g/dL (ref 6.0–8.3)

## 2013-02-09 LAB — LIPID PANEL
Cholesterol: 238 mg/dL — ABNORMAL HIGH (ref 0–200)
HDL: 36 mg/dL — ABNORMAL LOW (ref >39.00)
Total CHOL/HDL Ratio: 7
Triglycerides: 117 mg/dL (ref 0.0–149.0)
VLDL: 23.4 mg/dL (ref 0.0–40.0)

## 2013-02-09 LAB — CBC WITH DIFFERENTIAL/PLATELET
Basophils Absolute: 0.1 10*3/uL (ref 0.0–0.1)
Eosinophils Absolute: 0.2 10*3/uL (ref 0.0–0.7)
Lymphocytes Relative: 25.3 % (ref 12.0–46.0)
MCHC: 34.1 g/dL (ref 30.0–36.0)
Monocytes Relative: 10.2 % (ref 3.0–12.0)
Neutro Abs: 5.8 10*3/uL (ref 1.4–7.7)
Neutrophils Relative %: 61.7 % (ref 43.0–77.0)
Platelets: 229 10*3/uL (ref 150.0–400.0)
RDW: 13.5 % (ref 11.5–14.6)

## 2013-02-09 LAB — LDL CHOLESTEROL, DIRECT: Direct LDL: 183.9 mg/dL

## 2013-02-09 MED ORDER — BUPROPION HCL ER (XL) 150 MG PO TB24: 150.0000 mg | ORAL_TABLET | Freq: Two times a day (BID) | ORAL | Status: DC

## 2013-02-09 NOTE — Progress Notes (Signed)
Subjective:    Patient ID: Regina Chapman, female    DOB: 20-Sep-1950, 62 y.o.   MRN: [id number]  HPI Here for physical Does see Dr Marice Potter yearly Discussed weaning the estrogen  Having trouble with sleeping Initiates but then awakens Wild dreams Tried advil and tylenol PM-- intermittently help but not consistent Husband sleeps in separate bedroom---no apparent apnea No caffeine or alcohol in evenings No abnormal leg sensations  Current Outpatient Prescriptions on File Prior to Visit  Medication Sig Dispense Refill  . aspirin 81 MG tablet Take 81 mg by mouth daily.        Marland Kitchen buPROPion (WELLBUTRIN XL) 150 MG 24 hr tablet TAKE 1 TABLET (150 MG TOTAL) BY MOUTH DAILY.  31 tablet  10  . CLOBEX SPRAY 0.05 % external spray Apply topically as needed.       Marland Kitchen estradiol (ESTRACE) 0.5 MG tablet Take 1 tablet (0.5 mg total) by mouth daily.  90 tablet  5  . hydrochlorothiazide (HYDRODIURIL) 25 MG tablet Take 1 tablet (25 mg total) by mouth daily.  90 tablet  5   No current facility-administered medications on file prior to visit.    Allergies  Allergen Reactions  . Amoxicillin-Pot Clavulanate     REACTION: GI--not allergy (tolerates Amoxil)    Past Medical History  Diagnosis Date  . Depression   . History of melanoma 1985  . Hypertension   . Kidney stones   . Psoriasis   . Carcinoid tumor 2005  . Obesity     Past Surgical History  Procedure Laterality Date  . Melanoma excision  1985  . Lung removal, partial    . Laparoscopic assisted vaginal hysterectomy  1987    FOR ENDOMETRIOSIS    Family History  Problem Relation Age of Onset  . Cancer Sister     CERVICAL   . Heart disease Mother   . Dementia Mother   . Diabetes Neg Hx   . Hypertension Neg Hx   . Cancer Sister     History   Social History  . Marital Status: Married    Spouse Name: N/A    Number of Children: 1  . Years of Education: N/A   Occupational History  . Accounting and customer service     Retired  2013   Social History Main Topics  . Smoking status: Never Smoker   . Smokeless tobacco: Never Used  . Alcohol Use: Yes     Comment: 3 PER WEEK  . Drug Use: No  . Sexual Activity: No   Other Topics Concern  . Not on file   Social History Narrative  . No narrative on file   Review of Systems  Constitutional: Negative for fatigue and unexpected weight change.       Not much exercise Wears seat belt  HENT: Negative for dental problem, hearing loss, rhinorrhea and tinnitus.        Regular with dentist  Eyes: Negative for visual disturbance.       No diplopia or unilateral vision loss Cataracts done last year  Respiratory: Negative for cough, chest tightness and shortness of breath.   Cardiovascular: Negative for chest pain, palpitations and leg swelling.  Gastrointestinal: Positive for constipation. Negative for nausea, vomiting, abdominal pain and blood in stool.       No heartburn Uses OTC med for bowels  Endocrine: Positive for heat intolerance. Negative for cold intolerance.  Genitourinary: Negative for frequency, hematuria, difficulty urinating and dyspareunia.  Kidney stone removed last year  Musculoskeletal: Positive for arthralgias and back pain. Negative for neck pain.  Skin: Positive for rash.       Psoriasis --uses topical rx with fair control She doesn't want to go on shots though  Allergic/Immunologic: Negative for environmental allergies and immunocompromised state.  Neurological:       Intermittent headaches---aleve helps  Psychiatric/Behavioral: Positive for sleep disturbance and dysphoric mood. The patient is not nervous/anxious.        Has been crying more Some anhedonia---only once in a while       Objective:   Physical Exam  Constitutional: She is oriented to person, place, and time. She appears well-developed and well-nourished. No distress.  HENT:  Head: Normocephalic and atraumatic.  Right Ear: External ear normal.  Left Ear: External ear  normal.  Mouth/Throat: Oropharynx is clear and moist. No oropharyngeal exudate.  Eyes: Conjunctivae and EOM are normal. Pupils are equal, round, and reactive to light.  Neck: Normal range of motion. Neck supple. No thyromegaly present.  Cardiovascular: Normal rate, regular rhythm, normal heart sounds and intact distal pulses.  Exam reveals no gallop.   No murmur heard. Pulmonary/Chest: Effort normal and breath sounds normal. No respiratory distress. She has no wheezes. She has no rales.  Abdominal: Soft. There is no tenderness.  Musculoskeletal: She exhibits no edema and no tenderness.  Lymphadenopathy:    She has no cervical adenopathy.  Neurological: She is alert and oriented to person, place, and time.  Skin: No erythema.  Psychiatric: She has a normal mood and affect. Her behavior is normal.          Assessment & Plan:

## 2013-02-09 NOTE — Assessment & Plan Note (Signed)
BP Readings from Last 3 Encounters:  02/09/13 120/80  07/23/12 130/80  07/12/12 167/82   Good control Due for labs

## 2013-02-09 NOTE — Addendum Note (Signed)
Addended by: Sueanne Margarita on: 02/09/2013 03:52 PM   Modules accepted: Orders

## 2013-02-09 NOTE — Assessment & Plan Note (Signed)
Will check fecal immunoassay UTD on mammo Discussed fitness and exercise

## 2013-02-09 NOTE — Assessment & Plan Note (Signed)
Discussed primary prevention She prefers not to have statin

## 2013-02-09 NOTE — Assessment & Plan Note (Signed)
Intermittent symptoms Sleep problems with vivid dreams Will increase the bupropion

## 2013-02-09 NOTE — Patient Instructions (Addendum)
Please try the bupropion twice a day. If that doesn't help, you can try melatonin (over the counter). Try 3mg  nightly and increase to 6mg  if not effective.  DASH Diet The DASH diet stands for "Dietary Approaches to Stop Hypertension." It is a healthy eating plan that has been shown to reduce high blood pressure (hypertension) in as little as 14 days, while also possibly providing other significant health benefits. These other health benefits include reducing the risk of breast cancer after menopause and reducing the risk of type 2 diabetes, heart disease, colon cancer, and stroke. Health benefits also include weight loss and slowing kidney failure in patients with chronic kidney disease.  DIET GUIDELINES  Limit salt (sodium). Your diet should contain less than 1500 mg of sodium daily.  Limit refined or processed carbohydrates. Your diet should include mostly whole grains. Desserts and added sugars should be used sparingly.  Include small amounts of heart-healthy fats. These types of fats include nuts, oils, and tub margarine. Limit saturated and trans fats. These fats have been shown to be harmful in the body. CHOOSING FOODS  The following food groups are based on a 2000 calorie diet. See your Registered Dietitian for individual calorie needs. Grains and Grain Products (6 to 8 servings daily)  Eat More Often: Whole-wheat bread, brown rice, whole-grain or wheat pasta, quinoa, popcorn without added fat or salt (air popped).  Eat Less Often: White bread, white pasta, white rice, cornbread. Vegetables (4 to 5 servings daily)  Eat More Often: Fresh, frozen, and canned vegetables. Vegetables may be raw, steamed, roasted, or grilled with a minimal amount of fat.  Eat Less Often/Avoid: Creamed or fried vegetables. Vegetables in a cheese sauce. Fruit (4 to 5 servings daily)  Eat More Often: All fresh, canned (in natural juice), or frozen fruits. Dried fruits without added sugar. One hundred percent  fruit juice ( cup [237 mL] daily).  Eat Less Often: Dried fruits with added sugar. Canned fruit in light or heavy syrup. [employer], Fish, and Poultry (2 servings or less daily. One serving is 3 to 4 oz [85-114 g]).  Eat More Often: Ninety percent or leaner ground beef, tenderloin, sirloin. Round cuts of beef, chicken breast, [place] breast. All fish. Grill, bake, or broil your meat. Nothing should be fried.  Eat Less Often/Avoid: Fatty cuts of meat, [place], or chicken leg, thigh, or wing. Fried cuts of meat or fish. Dairy (2 to 3 servings)  Eat More Often: Low-fat or fat-free milk, low-fat plain or light yogurt, reduced-fat or part-skim cheese.  Eat Less Often/Avoid: Milk (whole, 2%).Whole milk yogurt. Full-fat cheeses. Nuts, Seeds, and Legumes (4 to 5 servings per week)  Eat More Often: All without added salt.  Eat Less Often/Avoid: Salted nuts and seeds, canned beans with added salt. Fats and Sweets (limited)  Eat More Often: Vegetable oils, tub margarines without trans fats, sugar-free gelatin. Mayonnaise and salad dressings.  Eat Less Often/Avoid: Coconut oils, palm oils, butter, stick margarine, cream, half and half, cookies, candy, pie. FOR MORE INFORMATION The Dash Diet Eating Plan: www.dashdiet.org Document Released: 04/03/2011 Document Revised: 07/07/2011 Document Reviewed: 04/03/2011 [facility] Patient Information 2014 [place], [place].

## 2013-05-12 ENCOUNTER — Ambulatory Visit: Payer: BC Managed Care – PPO | Admitting: Internal Medicine

## 2013-06-26 ENCOUNTER — Other Ambulatory Visit: Payer: Self-pay | Admitting: Obstetrics & Gynecology

## 2013-07-14 ENCOUNTER — Ambulatory Visit (INDEPENDENT_AMBULATORY_CARE_PROVIDER_SITE_OTHER): Payer: BC Managed Care – PPO | Admitting: Obstetrics & Gynecology

## 2013-07-14 ENCOUNTER — Encounter: Payer: Self-pay | Admitting: Obstetrics & Gynecology

## 2013-07-14 VITALS — BP 148/85 | HR 79 | Ht 63.0 in | Wt 201.0 lb

## 2013-07-14 DIAGNOSIS — E669 Obesity, unspecified: Secondary | ICD-10-CM | POA: Insufficient documentation

## 2013-07-14 DIAGNOSIS — Z0000 Encounter for general adult medical examination without abnormal findings: Secondary | ICD-10-CM

## 2013-07-14 DIAGNOSIS — Z01419 Encounter for gynecological examination (general) (routine) without abnormal findings: Secondary | ICD-10-CM

## 2013-07-14 MED ORDER — ESTRADIOL 0.5 MG PO TABS: 0.5000 mg | ORAL_TABLET | Freq: Every day | ORAL | Status: DC
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
## 2013-08-08 ENCOUNTER — Ambulatory Visit: Payer: BC Managed Care – PPO | Admitting: Internal Medicine

## 2013-08-09 ENCOUNTER — Ambulatory Visit (INDEPENDENT_AMBULATORY_CARE_PROVIDER_SITE_OTHER): Payer: BC Managed Care – PPO | Admitting: Internal Medicine

## 2013-08-09 ENCOUNTER — Encounter: Payer: Self-pay | Admitting: Internal Medicine

## 2013-08-09 VITALS — BP 126/82 | HR 74 | Temp 98.3°F | Wt 196.5 lb

## 2013-08-09 DIAGNOSIS — M545 Low back pain, unspecified: Secondary | ICD-10-CM

## 2013-08-09 DIAGNOSIS — R300 Dysuria: Secondary | ICD-10-CM

## 2013-08-09 DIAGNOSIS — R748 Abnormal levels of other serum enzymes: Secondary | ICD-10-CM

## 2013-08-09 DIAGNOSIS — F419 Anxiety disorder, unspecified: Secondary | ICD-10-CM

## 2013-08-09 DIAGNOSIS — F411 Generalized anxiety disorder: Secondary | ICD-10-CM

## 2013-08-09 LAB — POCT URINALYSIS DIPSTICK
Glucose, UA: NEGATIVE
KETONES UA: NEGATIVE
LEUKOCYTES UA: NEGATIVE
Nitrite, UA: NEGATIVE
PH UA: 6.5
PROTEIN UA: NEGATIVE
Spec Grav, UA: 1.02
Urobilinogen, UA: 0.2

## 2013-08-09 LAB — HEPATIC FUNCTION PANEL
ALT: 43 U/L — ABNORMAL HIGH (ref 0–35)
AST: 34 U/L (ref 0–37)
Albumin: 3.6 g/dL (ref 3.5–5.2)
Alkaline Phosphatase: 64 U/L (ref 39–117)
BILIRUBIN TOTAL: 0.6 mg/dL (ref 0.3–1.2)
Bilirubin, Direct: 0.1 mg/dL (ref 0.0–0.3)
Total Protein: 7 g/dL (ref 6.0–8.3)

## 2013-08-09 MED ORDER — ALPRAZOLAM 0.5 MG PO TABS: 0.5000 mg | ORAL_TABLET | Freq: Every evening | ORAL | Status: DC | PRN

## 2013-08-09 NOTE — Progress Notes (Signed)
Pre visit review using our clinic review tool, if applicable. No additional management support is needed unless otherwise documented below in the visit note. 

## 2013-08-09 NOTE — Addendum Note (Signed)
Addended by: Lurlean Nanny on: 08/09/2013 11:42 AM   Modules accepted: Orders

## 2013-08-09 NOTE — Patient Instructions (Addendum)

## 2013-08-09 NOTE — Progress Notes (Signed)
HPI  Pt presents to the clinic today with c/o dysuria and back pain. She reports this started 4 days ago. She feels like it is a UTI. She denies fever, chills and body aches. She has not taken anything OTC. She does have a history of kidney stones.  Additionally, she c/o anxiety. Her husband is currently in the ICU on a ventilator. He did have to be coded because his heart stopped. He does have a complicated medical history. She is taking Wellbutrion daily but just needs something short term while her husband is in the hospital.  Review of Systems  Past Medical History  Diagnosis Date  . History of melanoma 1985  . Kidney stones   . Psoriasis   . Carcinoid tumor 2005  . Obesity   . Depression   . Hyperlipidemia   . Hypertension   . Obesity     Family History  Problem Relation Age of Onset  . Cancer Sister     CERVICAL   . Heart disease Mother   . Dementia Mother   . Diabetes Neg Hx   . Hypertension Neg Hx   . Cancer Sister     History   Social History  . Marital Status: Married    Spouse Name: N/A    Number of Children: 1  . Years of Education: N/A   Occupational History  . Accounting and customer service     Retired 2013   Social History Main Topics  . Smoking status: Never Smoker   . Smokeless tobacco: Never Used  . Alcohol Use: Yes     Comment: 3 PER WEEK  . Drug Use: No  . Sexual Activity: No   Other Topics Concern  . Not on file   Social History Narrative  . No narrative on file    Allergies  Allergen Reactions  . Amoxicillin-Pot Clavulanate     REACTION: GI--not allergy (tolerates Amoxil)    Constitutional: Denies fever, malaise, fatigue, headache or abrupt weight changes.   GU: Pt reports low back pain and pain with urination. Denies burning sensation, blood in urine, odor or discharge. Skin: Denies redness, rashes, lesions or ulcercations.  Psych: Pt reports anxiety and depression. Denies SI/HI.  No other specific complaints in a complete  review of systems (except as listed in HPI above).    Objective:   Physical Exam  BP 126/82  Pulse 74  Temp(Src) 98.3 F (36.8 C) (Oral)  Wt 196 lb 8 oz (89.132 kg)  SpO2 97% Wt Readings from Last 3 Encounters:  08/09/13 196 lb 8 oz (89.132 kg)  07/14/13 201 lb (91.173 kg)  02/09/13 200 lb (90.719 kg)    General: Appears her stated age, well developed, well nourished in NAD. Cardiovascular: Normal rate and rhythm. S1,S2 noted.  No murmur, rubs or gallops noted. No JVD or BLE edema. No carotid bruits noted. Pulmonary/Chest: Normal effort and positive vesicular breath sounds. No respiratory distress. No wheezes, rales or ronchi noted.  Abdomen: Soft and nontender. Normal bowel sounds, no bruits noted. No distention or masses noted. Liver, spleen and kidneys non palpable. Tender to palpation over the bladder area. No CVA tenderness. Psych: Mood tearful and affect flat. Behavior and thought content normal.      Assessment & Plan:   Dysuria and low back pain secondary to   Urinalysis: mod blood, small bilirubin No evidence of UTI so no indication for abx at this time Will repeat CMET to reevaluate bilirubin in urine (has had elevated  liver enzymes in the past) Drink plenty of fluids  Anxiety:  Will give xanax for short term use Continue wellbutrin Support offered today RTC as needed or if symptoms persist.

## 2013-08-12 ENCOUNTER — Encounter: Payer: Self-pay | Admitting: Internal Medicine

## 2013-09-26 LAB — HM MAMMOGRAPHY: HM Mammogram: NORMAL

## 2013-10-13 ENCOUNTER — Telehealth: Payer: Self-pay | Admitting: Internal Medicine

## 2013-10-13 NOTE — Telephone Encounter (Signed)
Pt made appt for tomorrow--pt states she did not know if she wanted to come in and bought some cranberry juice and will try to see if that will help-- but i advised her to make the appt and if she changed her mind to call and cancel as we have providers out on vacation and empty appt slots will fill up fast

## 2013-10-13 NOTE — Telephone Encounter (Signed)
I will work her in if she has not already gone to urgent care. Add her on to 11:45 if she can come in.

## 2013-10-13 NOTE — Telephone Encounter (Signed)
Dr. [name] out of office.  Routed to Webb Silversmith, NP as Juluis Rainier.

## 2013-10-13 NOTE — Telephone Encounter (Signed)
Patient Information:  Caller Name: Shareece  Phone: [phone]  Patient: Regina Chapman, Regina Chapman  Gender: Female  DOB: [birth year]  Age: 63 Years  PCP: Viviana Simpler [facility])  Office Follow Up:  Does the office need to follow up with this patient?: Yes  Instructions For The Office: Referred to [facility] or ED for care. No appt available.  RN Note:  Patient states she has spoken with  the office has no appt available at any location. Reviewed EPIC . No appt . Referred caller to [facility] or ED for care. Understanding expressed.  Symptoms  Reason For Call & Symptoms: "I feel like i have a UTI".  Onset Monday 10/10/13.  Complains of lower back pain and supra pubic pain.  +burning, +pressure, no blood noted, no fever.  Last UOP- this morning.  Reviewed Health History In EMR: Yes  Reviewed Medications In EMR: Yes  Reviewed Allergies In EMR: Yes  Reviewed Surgeries / Procedures: Yes  Date of Onset of Symptoms: 10/10/2013  Treatments Tried: Tylenol Tori Milks  Treatments Tried Worked: No  Guideline(s) Used:  Urination Pain - Female  Disposition Per Guideline:   Go to Office Now  Reason For Disposition Reached:   Side (flank) or lower back pain present  Advice Given:  Fluids:   Drink extra fluids. Drink 8-10 glasses of liquids a day (Reason: to produce a dilute, non-irritating urine).  Cranberry Juice:   Some people think that drinking cranberry juice may help in fighting urinary tract infections. However, there is no good research that has ever proved this.  Call Back If:  You become worse.  RN Overrode Recommendation:  Go To [facility]  No appt available per office. Referred to [facility] or ED

## 2013-10-14 ENCOUNTER — Ambulatory Visit: Payer: BC Managed Care – PPO | Admitting: Internal Medicine

## 2013-10-16 NOTE — Telephone Encounter (Signed)
Please check on her Monday morning

## 2013-10-18 NOTE — Telephone Encounter (Signed)
Left message on machine asking how pt was doing, advised her to return my call

## 2013-11-08 ENCOUNTER — Other Ambulatory Visit: Payer: Self-pay | Admitting: Internal Medicine

## 2013-11-09 NOTE — Telephone Encounter (Signed)
Last filled 08/09/13--however in OV note it states it was for short term use--should i let pt know to f/u with PCP--please advise

## 2013-11-09 NOTE — Telephone Encounter (Signed)
Yes she should follow up with PCP to discuss

## 2013-11-10 ENCOUNTER — Ambulatory Visit: Payer: Self-pay | Admitting: Obstetrics & Gynecology

## 2013-11-12 ENCOUNTER — Other Ambulatory Visit: Payer: Self-pay | Admitting: Internal Medicine

## 2013-11-16 ENCOUNTER — Other Ambulatory Visit: Payer: Self-pay | Admitting: Internal Medicine

## 2013-11-17 ENCOUNTER — Encounter: Payer: Self-pay | Admitting: *Deleted

## 2013-11-17 NOTE — Telephone Encounter (Signed)
08/09/13 

## 2013-11-17 NOTE — Telephone Encounter (Signed)
Okay #30 x 0 Have her schedule physical after Novemeber 1st

## 2013-11-17 NOTE — Telephone Encounter (Signed)
rx called into pharmacy Sent patient message thru my-chart, advised to call for appt

## 2014-01-20 ENCOUNTER — Encounter: Payer: Self-pay | Admitting: Internal Medicine

## 2014-01-20 ENCOUNTER — Ambulatory Visit (INDEPENDENT_AMBULATORY_CARE_PROVIDER_SITE_OTHER): Payer: BC Managed Care – PPO | Admitting: Internal Medicine

## 2014-01-20 VITALS — BP 153/86 | HR 86 | Temp 97.8°F | Wt 190.0 lb

## 2014-01-20 DIAGNOSIS — I10 Essential (primary) hypertension: Secondary | ICD-10-CM

## 2014-01-20 DIAGNOSIS — Z23 Encounter for immunization: Secondary | ICD-10-CM

## 2014-01-20 DIAGNOSIS — F3289 Other specified depressive episodes: Secondary | ICD-10-CM

## 2014-01-20 DIAGNOSIS — R300 Dysuria: Secondary | ICD-10-CM

## 2014-01-20 DIAGNOSIS — E785 Hyperlipidemia, unspecified: Secondary | ICD-10-CM

## 2014-01-20 DIAGNOSIS — F4321 Adjustment disorder with depressed mood: Secondary | ICD-10-CM

## 2014-01-20 DIAGNOSIS — F329 Major depressive disorder, single episode, unspecified: Secondary | ICD-10-CM

## 2014-01-20 DIAGNOSIS — N3090 Cystitis, unspecified without hematuria: Secondary | ICD-10-CM

## 2014-01-20 LAB — COMPREHENSIVE METABOLIC PANEL
ALT: 32 U/L (ref 0–35)
AST: 31 U/L (ref 0–37)
Albumin: 3.9 g/dL (ref 3.5–5.2)
Alkaline Phosphatase: 71 U/L (ref 39–117)
BILIRUBIN TOTAL: 0.6 mg/dL (ref 0.2–1.2)
BUN: 16 mg/dL (ref 6–23)
CALCIUM: 9.4 mg/dL (ref 8.4–10.5)
CHLORIDE: 103 meq/L (ref 96–112)
CO2: 28 meq/L (ref 19–32)
Creatinine, Ser: 0.8 mg/dL (ref 0.4–1.2)
GFR: 80.23 mL/min (ref >60.00)
Glucose, Bld: 102 mg/dL — ABNORMAL HIGH (ref 70–99)
Potassium: 4.1 mEq/L (ref 3.5–5.1)
Sodium: 138 mEq/L (ref 135–145)
Total Protein: 8 g/dL (ref 6.0–8.3)

## 2014-01-20 LAB — POCT URINALYSIS DIPSTICK
BILIRUBIN UA: NEGATIVE
Glucose, UA: NEGATIVE
KETONES UA: NEGATIVE
Leukocytes, UA: NEGATIVE
NITRITE UA: NEGATIVE
PH UA: 6
Protein, UA: NEGATIVE
RBC UA: NEGATIVE
SPEC GRAV UA: 1.02
Urobilinogen, UA: NEGATIVE

## 2014-01-20 LAB — CBC WITH DIFFERENTIAL/PLATELET
BASOS PCT: 0.6 % (ref 0.0–3.0)
Basophils Absolute: 0 10*3/uL (ref 0.0–0.1)
EOS PCT: 3.9 % (ref 0.0–5.0)
Eosinophils Absolute: 0.3 10*3/uL (ref 0.0–0.7)
HEMATOCRIT: 46.4 % — AB (ref 36.0–46.0)
HEMOGLOBIN: 15.6 g/dL — AB (ref 12.0–15.0)
LYMPHS ABS: 2.1 10*3/uL (ref 0.7–4.0)
LYMPHS PCT: 23.9 % (ref 12.0–46.0)
MCHC: 33.7 g/dL (ref 30.0–36.0)
MCV: 91.4 fl (ref 78.0–100.0)
MONOS PCT: 8.4 % (ref 3.0–12.0)
Monocytes Absolute: 0.7 10*3/uL (ref 0.1–1.0)
NEUTROS ABS: 5.5 10*3/uL (ref 1.4–7.7)
Neutrophils Relative %: 63.2 % (ref 43.0–77.0)
Platelets: 233 10*3/uL (ref 150.0–400.0)
RBC: 5.08 Mil/uL (ref 3.87–5.11)
RDW: 13.6 % (ref 11.5–15.5)
WBC: 8.8 10*3/uL (ref 4.0–10.5)

## 2014-01-20 LAB — LIPID PANEL
CHOL/HDL RATIO: 6
Cholesterol: 232 mg/dL — ABNORMAL HIGH (ref 0–200)
HDL: 40.8 mg/dL (ref >39.00)
LDL Cholesterol: 167 mg/dL — ABNORMAL HIGH (ref 0–99)
NONHDL: 191.2
TRIGLYCERIDES: 120 mg/dL (ref 0.0–149.0)
VLDL: 24 mg/dL (ref 0.0–40.0)

## 2014-01-20 LAB — T4, FREE: Free T4: 0.92 ng/dL (ref 0.60–1.60)

## 2014-01-20 MED ORDER — TRAZODONE HCL 50 MG PO TABS: 50.0000 mg | ORAL_TABLET | Freq: Every day | ORAL | Status: DC

## 2014-01-20 MED ORDER — SULFAMETHOXAZOLE-TMP DS 800-160 MG PO TABS: 1.0000 | ORAL_TABLET | Freq: Two times a day (BID) | ORAL | Status: DC

## 2014-01-20 NOTE — Assessment & Plan Note (Signed)
Will recheck  No new meds for now

## 2014-01-20 NOTE — Assessment & Plan Note (Signed)
Continue current meds Add trazodone just for sleep

## 2014-01-20 NOTE — Assessment & Plan Note (Signed)
Still seems to be in a normal grieving pattern Will refer for bereavement counseling No change in meds for now

## 2014-01-20 NOTE — Progress Notes (Signed)
Pre visit review using our clinic review tool, if applicable. No additional management support is needed unless otherwise documented below in the visit note. 

## 2014-01-20 NOTE — Progress Notes (Signed)
Subjective:    Patient ID: Regina Chapman, female    DOB: [birth year], 63 y.o.   MRN: [id number]  HPI Husband died 3.5 months ago Can't sleep--- tried aleve PM, tylenol PM, melatonin (10mg )---no help Had spent most of he time with her husband---he had health issues before his death Then 07/15/22 to 09-14-22 daily in the hospital  Watches 85 year old grandson-- this is good, and tiring Tries to go with friends--like after they are done with work Development worker, community program through [place] is important to her Does go to [employer] reached out to pastor  Still has vivid dreams Used a friend's trazodone 1 night and that did help  Most days will have some sadness, etc Good days watching grandson  Has some burning dysuria Started 3-4 days ago No hematuria No fever  Back pain last weekend Thinks she pulled something Some better this week  Current Outpatient Prescriptions on File Prior to Visit  Medication Sig Dispense Refill  . ALPRAZolam (XANAX) 0.5 MG tablet TAKE 1 TABLET BY MOUTH AT BEDTIME AS NEEDED FOR ANXIETY  30 tablet  0  . aspirin 81 MG tablet Take 81 mg by mouth daily.        Marland Kitchen buPROPion (WELLBUTRIN XL) 150 MG 24 hr tablet Take 1 tablet (150 mg total) by mouth 2 (two) times daily.  60 tablet  11  . CLOBEX SPRAY 0.05 % external spray Apply topically as needed.       Marland Kitchen estradiol (ESTRACE) 0.5 MG tablet Take 1 tablet (0.5 mg total) by mouth daily.  90 tablet  4  . hydrochlorothiazide (HYDRODIURIL) 25 MG tablet TAKE 1 TABLET (25 MG TOTAL) BY MOUTH DAILY.  90 tablet  4  . potassium citrate (UROCIT-K) 10 MEQ (1080 MG) SR tablet Take 40 mEq by mouth daily.        No current facility-administered medications on file prior to visit.    Allergies  Allergen Reactions  . Amoxicillin-Pot Clavulanate     REACTION: GI--not allergy (tolerates Amoxil)    Past Medical History  Diagnosis Date  . History of melanoma 1985  . Kidney stones   . Psoriasis   .  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 Obesity   . Depression   . Hyperlipidemia   . Hypertension   . Obesity     Past Surgical History  Procedure Laterality Date  . Melanoma excision  1985  . Lung removal, partial    . Laparoscopic assisted vaginal hysterectomy  1987    FOR ENDOMETRIOSIS  . Cataract extraction w/ intraocular lens  implant, bilateral  07/15/11    Family History  Problem Relation Age of Onset  . Cancer Sister     CERVICAL   . Heart disease Mother   . Dementia Mother   . Diabetes Neg Hx   . Hypertension Neg Hx   . Cancer Sister     History   Social History  . Marital Status: Widowed    Spouse Name: N/A    Number of Children: 1  . Years of Education: N/A   Occupational History  . 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 Smoking status: Never Smoker   . Smokeless tobacco: Never Used  . Alcohol Use: Yes     Comment: 3 PER WEEK  . Drug Use: No  . Sexual Activity: No   Other Topics Concern  . Not on  file   Social History Narrative  . No narrative on file   Review of Systems Appetite is "okay" Weight is down 10-12# since husband's death    Objective:   Physical Exam  Constitutional: She appears well-developed and well-nourished. No distress.  Abdominal: She exhibits no distension. There is no tenderness. There is no rebound.  Psychiatric:  Normal dress and speech Appears sad but appropriate affect No thought process disturbances No suicidal ideation          Assessment & Plan:

## 2014-01-20 NOTE — Assessment & Plan Note (Signed)
Burning dysuria without fever/systemic symptoms and normal urine Will try 3 days of sulfa

## 2014-01-20 NOTE — Assessment & Plan Note (Signed)
Up some today but upset No changes

## 2014-01-20 NOTE — Patient Instructions (Addendum)
Please contact [facility] [phone] to access their bereavement counselors.  Please start the trazodone at bedtime---1 tab for 3 days. If you are not sleeping well on this, increase to 2 at bedtime. Start the antibiotic. If your urine symptoms are better with the 1st or 2nd dose, you can stop it after 3 days.

## 2014-02-16 NOTE — Addendum Note (Signed)
Addended by: Despina Hidden on: 02/16/2014 01:07 PM   Modules accepted: Orders

## 2014-02-27 ENCOUNTER — Encounter: Payer: Self-pay | Admitting: Internal Medicine

## 2014-03-22 ENCOUNTER — Encounter: Payer: Self-pay | Admitting: Internal Medicine

## 2014-03-22 ENCOUNTER — Ambulatory Visit (INDEPENDENT_AMBULATORY_CARE_PROVIDER_SITE_OTHER): Payer: BC Managed Care – PPO | Admitting: Internal Medicine

## 2014-03-22 VITALS — BP 128/76 | HR 72 | Temp 98.1°F | Wt 190.0 lb

## 2014-03-22 DIAGNOSIS — J019 Acute sinusitis, unspecified: Secondary | ICD-10-CM

## 2014-03-22 MED ORDER — CEFUROXIME AXETIL 500 MG PO TABS: 500.0000 mg | ORAL_TABLET | Freq: Two times a day (BID) | ORAL | Status: DC

## 2014-03-22 NOTE — Progress Notes (Signed)
HPI  Pt presents to the clinic today with c/o headache, facial pain and pressure, cough and sore throat. She reports this started 2 weeks ago. She is blowing thick green mucous out of her nose. It seems to be worse on the left side. The cough is unproductive. She denies fever or chills but has had fatigue and body aches. She has tried Aleve and Tylenol OTC without any relief. She has no history of allergies. She does not smoke. She has not had sick contacts that she is aware of.  Review of Systems    Past Medical History  Diagnosis Date  . History of melanoma 1985  . Kidney stones   . Psoriasis   . Carcinoid tumor 2005  . Obesity   . Depression   . Hyperlipidemia   . Hypertension   . Obesity     Family History  Problem Relation Age of Onset  . Cancer Sister     CERVICAL   . Heart disease Mother   . Dementia Mother   . Diabetes Neg Hx   . Hypertension Neg Hx   . Cancer Sister     History   Social History  . Marital Status: Widowed    Spouse Name: N/A    Number of Children: 1  . Years of Education: N/A   Occupational History  . Accounting and customer service     Retired 2013   Social History Main Topics  . Smoking status: Never Smoker   . Smokeless tobacco: Never Used  . Alcohol Use: Yes     Comment: 3 PER WEEK  . Drug Use: No  . Sexual Activity: No   Other Topics Concern  . Not on file   Social History Narrative    Allergies  Allergen Reactions  . Amoxicillin-Pot Clavulanate     REACTION: GI--not allergy (tolerates Amoxil)     Constitutional: Positive headache, fatigue. Denies fever or abrupt weight changes.  HEENT:  Positive facial pain, nasal congestion and sore throat. Denies eye redness, ear pain, ringing in the ears, wax buildup, runny nose or bloody nose. Respiratory: Positive cough. Denies difficulty breathing or shortness of breath.  Cardiovascular: Denies chest pain, chest tightness, palpitations or swelling in the hands or feet.   No  other specific complaints in a complete review of systems (except as listed in HPI above).  Objective:  BP 128/76 mmHg  Pulse 72  Temp(Src) 98.1 F (36.7 C) (Oral)  Wt 190 lb (86.183 kg)  SpO2 97%   General: Appears her stated age, ill appearing in NAD. HEENT: Head: normal shape and size, frontal and maxillary sinus tenderness on the left side noted; Ears: Tm's gray and intact, normal light reflex; Nose: mucosa pink and moist, septum midline; Throat/Mouth: + PND. Teeth present, mucosa pink and moist, no exudate noted, no lesions or ulcerations noted.  Cardiovascular: Normal rate and rhythm. S1,S2 noted.  No murmur, rubs or gallops noted.  Pulmonary/Chest: Normal effort and positive vesicular breath sounds. No respiratory distress. No wheezes, rales or ronchi noted.      Assessment & Plan:   Acute bacterial sinusitis  Can use a Neti Pot which can be purchased from your local drug store. Flonase 2 sprays each nostril for 3 days and then as needed. Ceftin BID for 10 days  RTC as needed or if symptoms persist.

## 2014-03-22 NOTE — Progress Notes (Signed)
Pre visit review using our clinic review tool, if applicable. No additional management support is needed unless otherwise documented below in the visit note. 

## 2014-03-22 NOTE — Patient Instructions (Signed)

## 2014-03-28 ENCOUNTER — Encounter: Payer: Self-pay | Admitting: Internal Medicine

## 2014-03-29 MED ORDER — AMOXICILLIN 500 MG PO TABS: 1000.0000 mg | ORAL_TABLET | Freq: Two times a day (BID) | ORAL | Status: DC

## 2014-04-07 ENCOUNTER — Ambulatory Visit (INDEPENDENT_AMBULATORY_CARE_PROVIDER_SITE_OTHER): Payer: BC Managed Care – PPO | Admitting: Internal Medicine

## 2014-04-07 ENCOUNTER — Encounter: Payer: Self-pay | Admitting: Internal Medicine

## 2014-04-07 VITALS — BP 136/84 | HR 81 | Temp 98.1°F | Ht 63.0 in | Wt 189.0 lb

## 2014-04-07 DIAGNOSIS — E785 Hyperlipidemia, unspecified: Secondary | ICD-10-CM

## 2014-04-07 DIAGNOSIS — Z0000 Encounter for general adult medical examination without abnormal findings: Secondary | ICD-10-CM

## 2014-04-07 DIAGNOSIS — I10 Essential (primary) hypertension: Secondary | ICD-10-CM

## 2014-04-07 DIAGNOSIS — F3340 Major depressive disorder, recurrent, in remission, unspecified: Secondary | ICD-10-CM

## 2014-04-07 DIAGNOSIS — Z1211 Encounter for screening for malignant neoplasm of colon: Secondary | ICD-10-CM

## 2014-04-07 MED ORDER — FLUCONAZOLE 150 MG PO TABS: 150.0000 mg | ORAL_TABLET | Freq: Once | ORAL | Status: DC

## 2014-04-07 MED ORDER — ZOSTER VACCINE LIVE 19400 UNT/0.65ML ~~LOC~~ SOLR: 0.6500 mL | Freq: Once | SUBCUTANEOUS | Status: DC

## 2014-04-07 NOTE — Assessment & Plan Note (Signed)
Discussed primary prevention She prefers no statin for now

## 2014-04-07 NOTE — Assessment & Plan Note (Signed)
Better with the grieving Will continue the med indefinitely

## 2014-04-07 NOTE — Assessment & Plan Note (Signed)
Healthy Info on DASH diet Fecal immunoassay Rx for zostavax UTD otherwise

## 2014-04-07 NOTE — Progress Notes (Signed)
Subjective:    Patient ID: Regina Chapman, female    DOB: [birth year], 63 y.o.   MRN: [id number]  HPI Here for physical  Feeling some better with the grieving Still watches grandson Just finished with grief group Still on bupropion--- depression goes back to 20's   Has been walking some in neighborhood Not at gym Not following any eating plan  Still sees Dr Hulan Fray yearly Discussed cancer screening--doesn't need paps. Prefers not to have another colonoscopy Will send order for zostavax  Did take the amoxicillin Now with vaginal yeast infection Will Rx fluconazole  Current Outpatient Prescriptions on File Prior to Visit  Medication Sig Dispense Refill  . aspirin 81 MG tablet Take 81 mg by mouth daily.      Marland Kitchen buPROPion (WELLBUTRIN XL) 150 MG 24 hr tablet Take 1 tablet (150 mg total) by mouth 2 (two) times daily. 60 tablet 11  . estradiol (ESTRACE) 0.5 MG tablet Take 1 tablet (0.5 mg total) by mouth daily. 90 tablet 4  . hydrochlorothiazide (HYDRODIURIL) 25 MG tablet TAKE 1 TABLET (25 MG TOTAL) BY MOUTH DAILY. 90 tablet 4  . potassium citrate (UROCIT-K) 10 MEQ (1080 MG) SR tablet Take 40 mEq by mouth daily.     . traZODone (DESYREL) 50 MG tablet Take 1-2 tablets (50-100 mg total) by mouth at bedtime. 60 tablet 2   No current facility-administered medications on file prior to visit.    Allergies  Allergen Reactions  . Amoxicillin-Pot Clavulanate     REACTION: GI--not allergy (tolerates Amoxil)    Past Medical History  Diagnosis Date  . History of melanoma 1985  . Kidney stones   . Psoriasis   . Carcinoid tumor 2005  . Obesity   . Depression   . Hyperlipidemia   . Hypertension   . Obesity     Past Surgical History  Procedure Laterality Date  . Melanoma excision  1985  . Lung removal, partial    . Laparoscopic assisted vaginal hysterectomy  1987    FOR ENDOMETRIOSIS  . Cataract extraction w/ intraocular lens  implant, bilateral  2013    Family History    Problem Relation Age of Onset  . Cancer Sister     CERVICAL   . Heart disease Mother   . Dementia Mother   . Diabetes Neg Hx   . Hypertension Neg Hx   . Cancer Sister     History   Social History  . Marital Status: Widowed    Spouse Name: N/A    Number of Children: 1  . Years of Education: N/A   Occupational History  . Accounting and customer service     Retired 2013   Social History Main Topics  . Smoking status: Never Smoker   . Smokeless tobacco: Never Used  . Alcohol Use: Yes     Comment: 3 PER WEEK  . Drug Use: No  . Sexual Activity: No   Other Topics Concern  . Not on file   Social History Narrative   Review of Systems  Constitutional: Negative for fatigue and unexpected weight change.       Wears seat belt  HENT: Negative for dental problem, hearing loss and tinnitus.        Regular with dentist  Eyes: Negative for visual disturbance.       No diplopia or unilateral vision loss  Respiratory: Negative for cough, chest tightness and shortness of breath.   Cardiovascular: Negative for chest pain, palpitations and leg  swelling.  Gastrointestinal: Negative for nausea, vomiting, abdominal pain, constipation and blood in stool.       No heartburn  Endocrine: Negative for polydipsia and polyuria.  Genitourinary: Negative for dysuria, hematuria and difficulty urinating.  Musculoskeletal: Positive for back pain. Negative for joint swelling and arthralgias.       Sporadic back and anterior thigh pain---not often  Skin: Positive for rash.       Psoriasis-- has topical Rx from derm  Allergic/Immunologic: Negative for environmental allergies and immunocompromised state.  Neurological: Negative for dizziness, syncope, weakness, light-headedness, numbness and headaches.  Hematological: Negative for adenopathy. Does not bruise/bleed easily.  Psychiatric/Behavioral: Positive for dysphoric mood. Negative for sleep disturbance. The patient is not nervous/anxious.         Sleeping well with the trazodone       Objective:   Physical Exam  Constitutional: She is oriented to person, place, and time. She appears well-developed and well-nourished. No distress.  HENT:  Head: Normocephalic and atraumatic.  Right Ear: External ear normal.  Left Ear: External ear normal.  Mouth/Throat: Oropharynx is clear and moist. No oropharyngeal exudate.  Eyes: Conjunctivae and EOM are normal. Pupils are equal, round, and reactive to light.  Neck: Normal range of motion. Neck supple. No thyromegaly present.  Cardiovascular: Normal rate, regular rhythm, normal heart sounds and intact distal pulses.  Exam reveals no gallop.   No murmur heard. Pulmonary/Chest: Effort normal and breath sounds normal. No respiratory distress. She has no wheezes. She has no rales.  Abdominal: Soft. There is no tenderness.  Musculoskeletal: She exhibits no edema or tenderness.  Lymphadenopathy:    She has no cervical adenopathy.  Neurological: She is alert and oriented to person, place, and time.  Skin: No rash noted. No erythema.  Psychiatric: She has a normal mood and affect. Her behavior is normal.          Assessment & Plan:

## 2014-04-07 NOTE — Progress Notes (Signed)
Pre visit review using our clinic review tool, if applicable. No additional management support is needed unless otherwise documented below in the visit note. 

## 2014-04-07 NOTE — Assessment & Plan Note (Signed)
BP Readings from Last 3 Encounters:  04/07/14 136/84  03/22/14 128/76  01/20/14 153/86   Discussed lifestyle changes No med changes needed

## 2014-04-07 NOTE — Patient Instructions (Signed)
DASH Eating Plan °DASH stands for "Dietary Approaches to Stop Hypertension." The DASH eating plan is a healthy eating plan that has been shown to reduce high blood pressure (hypertension). Additional health benefits may include reducing the risk of type 2 diabetes mellitus, heart disease, and stroke. The DASH eating plan may also help with weight loss. °WHAT DO I NEED TO KNOW ABOUT THE DASH EATING PLAN? °For the DASH eating plan, you will follow these general guidelines: °· Choose foods with a percent daily value for sodium of less than 5% (as listed on the food label). °· Use salt-free seasonings or herbs instead of table salt or sea salt. °· Check with your health care provider or pharmacist before using salt substitutes. °· Eat lower-sodium products, often labeled as "lower sodium" or "no salt added." °· Eat fresh foods. °· Eat more vegetables, fruits, and low-fat dairy products. °· Choose whole grains. Look for the word "whole" as the first word in the ingredient list. °· Choose fish and skinless chicken or turkey more often than red meat. Limit fish, poultry, and meat to 6 oz (170 g) each day. °· Limit sweets, desserts, sugars, and sugary drinks. °· Choose heart-healthy fats. °· Limit cheese to 1 oz (28 g) per day. °· Eat more home-cooked food and less restaurant, buffet, and fast food. °· Limit fried foods. °· Cook foods using methods other than frying. °· Limit canned vegetables. If you do use them, rinse them well to decrease the sodium. °· When eating at a restaurant, ask that your food be prepared with less salt, or no salt if possible. °WHAT FOODS CAN I EAT? °Seek help from a dietitian for individual calorie needs. °Grains °Whole grain or whole wheat bread. Brown rice. Whole grain or whole wheat pasta. Quinoa, bulgur, and whole grain cereals. Low-sodium cereals. Corn or whole wheat flour tortillas. Whole grain cornbread. Whole grain crackers. Low-sodium crackers. °Vegetables °Fresh or frozen vegetables  (raw, steamed, roasted, or grilled). Low-sodium or reduced-sodium tomato and vegetable juices. Low-sodium or reduced-sodium tomato sauce and paste. Low-sodium or reduced-sodium canned vegetables.  °Fruits °All fresh, canned (in natural juice), or frozen fruits. °Meat and Other Protein Products °Ground beef (85% or leaner), grass-fed beef, or beef trimmed of fat. Skinless chicken or turkey. Ground chicken or turkey. Pork trimmed of fat. All fish and seafood. Eggs. Dried beans, peas, or lentils. Unsalted nuts and seeds. Unsalted canned beans. °Dairy °Low-fat dairy products, such as skim or 1% milk, 2% or reduced-fat cheeses, low-fat ricotta or cottage cheese, or plain low-fat yogurt. Low-sodium or reduced-sodium cheeses. °Fats and Oils °Tub margarines without trans fats. Light or reduced-fat mayonnaise and salad dressings (reduced sodium). Avocado. Safflower, olive, or canola oils. Natural peanut or almond butter. °Other °Unsalted popcorn and pretzels. °The items listed above may not be a complete list of recommended foods or beverages. Contact your dietitian for more options. °WHAT FOODS ARE NOT RECOMMENDED? °Grains °White bread. White pasta. White rice. Refined cornbread. Bagels and croissants. Crackers that contain trans fat. °Vegetables °Creamed or fried vegetables. Vegetables in a cheese sauce. Regular canned vegetables. Regular canned tomato sauce and paste. Regular tomato and vegetable juices. °Fruits °Dried fruits. Canned fruit in light or heavy syrup. Fruit juice. °Meat and Other Protein Products °Fatty cuts of meat. Ribs, chicken wings, bacon, sausage, bologna, salami, chitterlings, fatback, hot dogs, bratwurst, and packaged luncheon meats. Salted nuts and seeds. Canned beans with salt. °Dairy °Whole or 2% milk, cream, half-and-half, and cream cheese. Whole-fat or sweetened yogurt. Full-fat   cheeses or blue cheese. Nondairy creamers and whipped toppings. Processed cheese, cheese spreads, or cheese  curds. °Condiments °Onion and garlic salt, seasoned salt, table salt, and sea salt. Canned and packaged gravies. Worcestershire sauce. Tartar sauce. Barbecue sauce. Teriyaki sauce. Soy sauce, including reduced sodium. Steak sauce. Fish sauce. Oyster sauce. Cocktail sauce. Horseradish. Ketchup and mustard. Meat flavorings and tenderizers. Bouillon cubes. Hot sauce. Tabasco sauce. Marinades. Taco seasonings. Relishes. °Fats and Oils °Butter, stick margarine, lard, shortening, ghee, and bacon fat. Coconut, palm kernel, or palm oils. Regular salad dressings. °Other °Pickles and olives. Salted popcorn and pretzels. °The items listed above may not be a complete list of foods and beverages to avoid. Contact your dietitian for more information. °WHERE CAN I FIND MORE INFORMATION? °National Heart, Lung, and Blood Institute: www.nhlbi.nih.gov/health/health-topics/topics/dash/ °Document Released: 04/03/2011 Document Revised: 08/29/2013 Document Reviewed: 02/16/2013 °ExitCare® Patient Information ©2015 [employer]. This information is not intended to replace advice given to you by your health care provider. Make sure you discuss any questions you have with your health care provider. ° °

## 2014-04-10 ENCOUNTER — Telehealth: Payer: Self-pay | Admitting: Internal Medicine

## 2014-04-10 NOTE — Telephone Encounter (Signed)
emmi emailed °

## 2014-04-17 ENCOUNTER — Other Ambulatory Visit: Payer: Self-pay | Admitting: Internal Medicine

## 2014-04-25 ENCOUNTER — Other Ambulatory Visit: Payer: Self-pay | Admitting: Internal Medicine

## 2014-07-18 ENCOUNTER — Other Ambulatory Visit: Payer: Self-pay | Admitting: Obstetrics & Gynecology

## 2014-07-20 ENCOUNTER — Telehealth: Payer: Self-pay

## 2014-07-20 NOTE — Telephone Encounter (Signed)
Pt left v/m; Dr Hulan Fray has previously refilled HCTZ; pt said she was told to get HCTZ thru Dr [name]; [facility]. Still pending request to Dr Hulan Fray on 07/18/14.Please advise.

## 2014-07-24 MED ORDER — HYDROCHLOROTHIAZIDE 25 MG PO TABS: 25.0000 mg | ORAL_TABLET | Freq: Every day | ORAL | Status: DC

## 2014-07-24 NOTE — Telephone Encounter (Signed)
Rx sent to pharm

## 2014-07-25 NOTE — Telephone Encounter (Signed)
Ms. Brasington notified HCTZ refills have been sent to her pharmacy.

## 2014-07-30 ENCOUNTER — Other Ambulatory Visit: Payer: Self-pay | Admitting: Internal Medicine

## 2014-07-30 ENCOUNTER — Other Ambulatory Visit: Payer: Self-pay | Admitting: Obstetrics & Gynecology

## 2014-11-12 ENCOUNTER — Other Ambulatory Visit: Payer: Self-pay | Admitting: Internal Medicine

## 2014-11-13 NOTE — Telephone Encounter (Signed)
Last f/u 03/2014-CPE

## 2014-11-13 NOTE — Telephone Encounter (Signed)
Approved: okay x 1 year She is due for initial Medicare wellness visit after December 11 this year

## 2014-11-13 NOTE — Telephone Encounter (Signed)
I approved for a year...Marland KitchenMarland Kitchen

## 2014-11-14 MED ORDER — TRAZODONE HCL 50 MG PO TABS: 50.0000 mg | ORAL_TABLET | Freq: Every evening | ORAL | Status: DC | PRN

## 2014-11-14 NOTE — Telephone Encounter (Signed)
rx called into pharmacy

## 2014-11-14 NOTE — Addendum Note (Signed)
Addended by: Despina Hidden on: 11/14/2014 09:36 AM   Modules accepted: Orders

## 2014-11-22 ENCOUNTER — Ambulatory Visit (INDEPENDENT_AMBULATORY_CARE_PROVIDER_SITE_OTHER): Payer: 59 | Admitting: Primary Care

## 2014-11-22 ENCOUNTER — Ambulatory Visit (INDEPENDENT_AMBULATORY_CARE_PROVIDER_SITE_OTHER)
Admission: RE | Admit: 2014-11-22 | Discharge: 2014-11-22 | Disposition: A | Discharge status: Home / Self Care | Payer: 59 | Source: Ambulatory Visit | Attending: Primary Care | Admitting: Primary Care

## 2014-11-22 ENCOUNTER — Encounter: Payer: Self-pay | Admitting: Primary Care

## 2014-11-22 VITALS — BP 132/84 | HR 79 | Temp 98.1°F | Ht 63.0 in | Wt 186.8 lb

## 2014-11-22 DIAGNOSIS — R05 Cough: Secondary | ICD-10-CM | POA: Diagnosis not present

## 2014-11-22 DIAGNOSIS — R059 Cough, unspecified: Secondary | ICD-10-CM

## 2014-11-22 MED ORDER — DOXYCYCLINE HYCLATE 100 MG PO TABS: 100.0000 mg | ORAL_TABLET | Freq: Two times a day (BID) | ORAL | Status: DC

## 2014-11-22 MED ORDER — BENZONATATE 200 MG PO CAPS: 200.0000 mg | ORAL_CAPSULE | Freq: Three times a day (TID) | ORAL | Status: DC | PRN

## 2014-11-22 NOTE — Progress Notes (Signed)
Subjective:    Patient ID: Regina Chapman, female    DOB: 01/31/1951, 64 y.o.   MRN: [id number]  HPI  Regina Chapman is a 64 year old female who presents today with a chief complaint of cough. Her cough has been present for 2 weeks ago and overall has become worse. Her cough is non productive, she's had a few low grade fevers, right ear pain, and sinus pressure. She's taken aleve for body aches with some relief. She's not taken any OTC meds for cough.  Review of Systems  Constitutional: Positive for fever, chills and fatigue.  HENT: Positive for ear pain and sinus pressure. Negative for congestion and sore throat.   Respiratory: Positive for cough and chest tightness. Negative for shortness of breath.   Cardiovascular: Negative for chest pain.  Musculoskeletal: Positive for myalgias.       Past Medical History  Diagnosis Date  . History of melanoma 1985  . Kidney stones   . Psoriasis   . Carcinoid tumor 2005  . Obesity   . Depression   . Hyperlipidemia   . Hypertension   . Obesity     History   Social History  . Marital Status: Widowed    Spouse Name: N/A  . Number of Children: 1  . Years of Education: N/A   Occupational History  . Accounting and customer service     Retired 2013   Social History Main Topics  . Smoking status: Never Smoker   . Smokeless tobacco: Never Used  . Alcohol Use: Yes     Comment: 3 PER WEEK  . Drug Use: No  . Sexual Activity: No   Other Topics Concern  . Not on file   Social History Narrative    Past Surgical History  Procedure Laterality Date  . Melanoma excision  1985  . Lung removal, partial    . Laparoscopic assisted vaginal hysterectomy  1987    FOR ENDOMETRIOSIS  . Cataract extraction w/ intraocular lens  implant, bilateral  2013    Family History  Problem Relation Age of Onset  . Cancer Sister     CERVICAL   . Heart disease Mother   . Dementia Mother   . Diabetes Neg Hx   . Hypertension Neg Hx   . Cancer Sister      Allergies  Allergen Reactions  . Amoxicillin-Pot Clavulanate     REACTION: GI--not allergy (tolerates Amoxil)    Current Outpatient Prescriptions on File Prior to Visit  Medication Sig Dispense Refill  . aspirin 81 MG tablet Take 81 mg by mouth daily.      Marland Kitchen buPROPion (WELLBUTRIN XL) 150 MG 24 hr tablet TAKE 1 TABLET BY MOUTH TWICE A DAY 60 tablet 7  . estradiol (ESTRACE) 0.5 MG tablet TAKE 1 TABLET (0.5 MG TOTAL) BY MOUTH DAILY. 90 tablet 3  . hydrochlorothiazide (HYDRODIURIL) 25 MG tablet Take 1 tablet (25 mg total) by mouth daily. 90 tablet 3  . traZODone (DESYREL) 50 MG tablet Take 1-2 tablets (50-100 mg total) by mouth at bedtime as needed. 180 tablet 3   No current facility-administered medications on file prior to visit.    BP 132/84 mmHg  Pulse 79  Temp(Src) 98.1 F (36.7 C) (Oral)  Ht '5\' 3"'$  (1.6 m)  Wt 186 lb 12.8 oz (84.732 kg)  BMI 33.10 kg/m2  SpO2 97%    Objective:   Physical Exam  Constitutional: She is oriented to person, place, and time.  HENT:  Right Ear: Tympanic membrane and ear canal normal.  Left Ear: Tympanic membrane and ear canal normal.  Nose: Right sinus exhibits frontal sinus tenderness. Right sinus exhibits no maxillary sinus tenderness. Left sinus exhibits frontal sinus tenderness. Left sinus exhibits no maxillary sinus tenderness.  Mouth/Throat: Oropharynx is clear and moist.  Eyes: Conjunctivae are normal. Pupils are equal, round, and reactive to light.  Neck: Neck supple.  Cardiovascular: Normal rate and regular rhythm.   Pulmonary/Chest: Effort normal and breath sounds normal. She has no wheezes.  Lymphadenopathy:    She has no cervical adenopathy.  Neurological: She is alert and oriented to person, place, and time.  Skin: Skin is warm and dry.          Assessment & Plan:  Cough:  Suspect bacterial involvement at this point due to duration, worsening symptoms and fatigue. Xray today to rule out pneumonia. Will start  doxycycline BID x 10 days. RX for benzonatate capsules PRN for cough. Push fluids. Follow up if no improvement.

## 2014-11-22 NOTE — Progress Notes (Signed)
Pre visit review using our clinic review tool, if applicable. No additional management support is needed unless otherwise documented below in the visit note. 

## 2014-11-22 NOTE — Patient Instructions (Signed)
Complete xray(s) prior to leaving today. I will contact you regarding your results.  Start Doxycycline antibiotics. Take 1 tablet by mouth twice daily for 10 days.  You may take Benzonatate capsules for cough. Take 1 capsule by mouth three times daily as needed for cough.  Increase your intake of water to stay hydrated.  It was nice to meet you!

## 2014-11-28 ENCOUNTER — Encounter: Payer: Self-pay | Admitting: Primary Care

## 2014-11-28 MED ORDER — AMOXICILLIN 500 MG PO TABS: 1000.0000 mg | ORAL_TABLET | Freq: Two times a day (BID) | ORAL | Status: DC

## 2015-02-05 DIAGNOSIS — R911 Solitary pulmonary nodule: Secondary | ICD-10-CM | POA: Diagnosis not present

## 2015-02-05 DIAGNOSIS — R918 Other nonspecific abnormal finding of lung field: Secondary | ICD-10-CM | POA: Diagnosis not present

## 2015-02-05 DIAGNOSIS — Z6833 Body mass index (BMI) 33.0-33.9, adult: Secondary | ICD-10-CM | POA: Diagnosis not present

## 2015-02-05 DIAGNOSIS — C3490 Malignant neoplasm of unspecified part of unspecified bronchus or lung: Secondary | ICD-10-CM | POA: Diagnosis not present

## 2015-02-14 ENCOUNTER — Other Ambulatory Visit: Payer: Self-pay | Admitting: Obstetrics & Gynecology

## 2015-02-21 ENCOUNTER — Other Ambulatory Visit: Payer: Self-pay | Admitting: Obstetrics & Gynecology

## 2015-02-27 ENCOUNTER — Telehealth: Payer: Self-pay | Admitting: *Deleted

## 2015-02-27 DIAGNOSIS — E8940 Asymptomatic postprocedural ovarian failure: Secondary | ICD-10-CM

## 2015-02-27 DIAGNOSIS — Z79890 Hormone replacement therapy (postmenopausal): Principal | ICD-10-CM

## 2015-02-27 MED ORDER — ESTRADIOL 0.5 MG PO TABS: 0.5000 mg | ORAL_TABLET | Freq: Every day | ORAL | Status: DC

## 2015-02-27 NOTE — Telephone Encounter (Signed)
Sent rx to pharmacy, called pt left message that rx had been sent to pharmacy and that she would need to make a follow-up appt the first of the year to see Dr Hulan Fray for additional refills.

## 2015-02-27 NOTE — Telephone Encounter (Signed)
-----   Message from Francia Greaves sent at 02/27/2015  3:03 PM EDT ----- Regarding: Rx Refill Contact: [phone] Patient called and states she needs a refill on her estradiol, she states the pharmacy has sent a request but it has not been refilled yet. Uses [facility] in [place]

## 2015-03-07 ENCOUNTER — Ambulatory Visit (INDEPENDENT_AMBULATORY_CARE_PROVIDER_SITE_OTHER): Payer: Medicare Other | Admitting: Primary Care

## 2015-03-07 ENCOUNTER — Encounter: Payer: Self-pay | Admitting: Primary Care

## 2015-03-07 ENCOUNTER — Telehealth: Payer: Self-pay | Admitting: *Deleted

## 2015-03-07 VITALS — BP 132/84 | HR 84 | Temp 98.8°F | Ht 63.0 in | Wt 188.8 lb

## 2015-03-07 DIAGNOSIS — R059 Cough, unspecified: Secondary | ICD-10-CM

## 2015-03-07 DIAGNOSIS — R110 Nausea: Secondary | ICD-10-CM

## 2015-03-07 DIAGNOSIS — J019 Acute sinusitis, unspecified: Secondary | ICD-10-CM | POA: Diagnosis not present

## 2015-03-07 DIAGNOSIS — R05 Cough: Secondary | ICD-10-CM

## 2015-03-07 MED ORDER — HYDROCODONE-HOMATROPINE 5-1.5 MG/5ML PO SYRP: 5.0000 mL | ORAL_SOLUTION | Freq: Every evening | ORAL | Status: DC | PRN

## 2015-03-07 MED ORDER — AMOXICILLIN 875 MG PO TABS: 875.0000 mg | ORAL_TABLET | Freq: Two times a day (BID) | ORAL | Status: DC

## 2015-03-07 MED ORDER — PROMETHAZINE HCL 12.5 MG PO TABS: 12.5000 mg | ORAL_TABLET | Freq: Three times a day (TID) | ORAL | Status: DC | PRN

## 2015-03-07 MED ORDER — ONDANSETRON 4 MG PO TBDP: 4.0000 mg | ORAL_TABLET | Freq: Three times a day (TID) | ORAL | Status: DC | PRN

## 2015-03-07 NOTE — Progress Notes (Signed)
Subjective:    Patient ID: Regina Chapman, female    DOB: 1951/01/29, 64 y.o.   MRN: [id number]  HPI  Regina Chapman is a 63 year old female who presents today with a chief complaint of cough. She also exhibits symptoms of nasal congestion, sore throat, nausea, chest congestion, fatigue, sinus pressure. Her symptoms began 1 week ago. She started feeling better a few days ago, then started feeling worse yesterday. She saw her pulmonologist 2 weeks ago, had CT scan without changes from last year. She's been taking Aleve and some cough medication without relief. She will typically be prescribed Amoxicillin for these infections and tolerates well. Her most bothersome symptom is sinus pressure.  Review of Systems  Constitutional: Positive for fever and chills.  HENT: Positive for congestion, sinus pressure and sore throat. Negative for ear pain.   Respiratory: Positive for cough. Negative for shortness of breath.   Cardiovascular: Negative for chest pain.  Gastrointestinal: Positive for nausea. Negative for vomiting.  Musculoskeletal: Positive for myalgias.       Past Medical History  Diagnosis Date  . History of melanoma 1985  . Kidney stones   . Psoriasis   . Carcinoid tumor 2005  . Obesity   . Depression   . Hyperlipidemia   . Hypertension   . Obesity     Social History   Social History  . Marital Status: Widowed    Spouse Name: N/A  . Number of Children: 1  . Years of Education: N/A   Occupational History  . Accounting and customer service     Retired 2013   Social History Main Topics  . Smoking status: Never Smoker   . Smokeless tobacco: Never Used  . Alcohol Use: Yes     Comment: 3 PER WEEK  . Drug Use: No  . Sexual Activity: No   Other Topics Concern  . Not on file   Social History Narrative    Past Surgical History  Procedure Laterality Date  . Melanoma excision  1985  . Lung removal, partial    . Laparoscopic assisted vaginal hysterectomy  1987    FOR  ENDOMETRIOSIS  . Cataract extraction w/ intraocular lens  implant, bilateral  2013    Family History  Problem Relation Age of Onset  . Cancer Sister     CERVICAL   . Heart disease Mother   . Dementia Mother   . Diabetes Neg Hx   . Hypertension Neg Hx   . Cancer Sister     Allergies  Allergen Reactions  . Amoxicillin-Pot Clavulanate     REACTION: GI--not allergy (tolerates Amoxil)    Current Outpatient Prescriptions on File Prior to Visit  Medication Sig Dispense Refill  . aspirin 81 MG tablet Take 81 mg by mouth daily.      Marland Kitchen buPROPion (WELLBUTRIN XL) 150 MG 24 hr tablet TAKE 1 TABLET BY MOUTH TWICE A DAY 60 tablet 7  . doxycycline (VIBRA-TABS) 100 MG tablet Take 1 tablet (100 mg total) by mouth 2 (two) times daily. 20 tablet 0  . estradiol (ESTRACE) 0.5 MG tablet Take 1 tablet (0.5 mg total) by mouth daily. 90 tablet 1  . hydrochlorothiazide (HYDRODIURIL) 25 MG tablet Take 1 tablet (25 mg total) by mouth daily. 90 tablet 3  . traZODone (DESYREL) 50 MG tablet Take 1-2 tablets (50-100 mg total) by mouth at bedtime as needed. 180 tablet 3   No current facility-administered medications on file prior to visit.  BP 132/84 mmHg  Pulse 84  Temp(Src) 98.8 F (37.1 C) (Oral)  Ht '5\' 3"'$  (1.6 m)  Wt 188 lb 12.8 oz (85.639 kg)  BMI 33.45 kg/m2  SpO2 97%    Objective:   Physical Exam  Constitutional: She appears well-nourished.  HENT:  Right Ear: Tympanic membrane and ear canal normal.  Left Ear: Tympanic membrane and ear canal normal.  Nose: Right sinus exhibits maxillary sinus tenderness and frontal sinus tenderness. Left sinus exhibits maxillary sinus tenderness and frontal sinus tenderness.  Mouth/Throat: Posterior oropharyngeal erythema present. No oropharyngeal exudate or posterior oropharyngeal edema.  Cardiovascular: Normal rate and regular rhythm.   Pulmonary/Chest: Effort normal and breath sounds normal. She has no rales.  Skin: Skin is warm and dry.            Assessment & Plan:  Acute Sinusitis:  Moderate sinus pressure to frontal and maxillary sinuses x 1 week, now worse and with green mucous from nose. Also with cough, fatigue, nausea. Exam with moderate tenderness to frontal sinuses, more mild tenderness to maxillary. Lungs clear and exam otherwise unremarkable. Cough present. Will treat with Amoxicillin 875 BID x 10 days. She has taken this before which works well. Also RX for Hycodan to take at night with instructions to hold trazodone. Fluids, rest. Follow up PRN.

## 2015-03-07 NOTE — Patient Instructions (Signed)
Start Amoxicillin antibiotics for sinus infection. Take 1 tablet by mouth twice daily for 10 days.  You may take the Hycodan at bedtime for cough and rest.  You may take the ondansetron once every 8 hours as needed for nausea.  Told trazodone at bedtime when taking the cough syrup at night.  Please call us if no improvement in 3-4 days.  It was a pleasure to see you today!  Sinusitis, Adult Sinusitis is redness, soreness, and inflammation of the paranasal sinuses. Paranasal sinuses are air pockets within the bones of your face. They are located beneath your eyes, in the middle of your forehead, and above your eyes. In healthy paranasal sinuses, mucus is able to drain out, and air is able to circulate through them by way of your nose. However, when your paranasal sinuses are inflamed, mucus and air can become trapped. This can allow bacteria and other germs to grow and cause infection. Sinusitis can develop quickly and last only a short time (acute) or continue over a long period (chronic). Sinusitis that lasts for more than 12 weeks is considered chronic. CAUSES Causes of sinusitis include:  Allergies.  Structural abnormalities, such as displacement of the cartilage that separates your nostrils (deviated septum), which can decrease the air flow through your nose and sinuses and affect sinus drainage.  Functional abnormalities, such as when the small hairs (cilia) that line your sinuses and help remove mucus do not work properly or are not present. SIGNS AND SYMPTOMS Symptoms of acute and chronic sinusitis are the same. The primary symptoms are pain and pressure around the affected sinuses. Other symptoms include:  Upper toothache.  Earache.  Headache.  Bad breath.  Decreased sense of smell and taste.  A cough, which worsens when you are lying flat.  Fatigue.  Fever.  Thick drainage from your nose, which often is green and may contain pus (purulent).  Swelling and warmth  over the affected sinuses. DIAGNOSIS Your health care provider will perform a physical exam. During your exam, your health care provider may perform any of the following to help determine if you have acute sinusitis or chronic sinusitis:  Look in your nose for signs of abnormal growths in your nostrils (nasal polyps).  Tap over the affected sinus to check for signs of infection.  View the inside of your sinuses using an imaging device that has a light attached (endoscope). If your health care provider suspects that you have chronic sinusitis, one or more of the following tests may be recommended:  Allergy tests.  Nasal culture. A sample of mucus is taken from your nose, sent to a lab, and screened for bacteria.  Nasal cytology. A sample of mucus is taken from your nose and examined by your health care provider to determine if your sinusitis is related to an allergy. TREATMENT Most cases of acute sinusitis are related to a viral infection and will resolve on their own within 10 days. Sometimes, medicines are prescribed to help relieve symptoms of both acute and chronic sinusitis. These may include pain medicines, decongestants, nasal steroid sprays, or saline sprays. However, for sinusitis related to a bacterial infection, your health care provider will prescribe antibiotic medicines. These are medicines that will help kill the bacteria causing the infection. Rarely, sinusitis is caused by a fungal infection. In these cases, your health care provider will prescribe antifungal medicine. For some cases of chronic sinusitis, surgery is needed. Generally, these are cases in which sinusitis recurs more than 3 times  per year, despite other treatments. HOME CARE INSTRUCTIONS  Drink plenty of water. Water helps thin the mucus so your sinuses can drain more easily.  Use a humidifier.  Inhale steam 3-4 times a day (for example, sit in the bathroom with the shower running).  Apply a warm, moist  washcloth to your face 3-4 times a day, or as directed by your health care provider.  Use saline nasal sprays to help moisten and clean your sinuses.  Take medicines only as directed by your health care provider.  If you were prescribed either an antibiotic or antifungal medicine, finish it all even if you start to feel better. SEEK IMMEDIATE MEDICAL CARE IF:  You have increasing pain or severe headaches.  You have nausea, vomiting, or drowsiness.  You have swelling around your face.  You have vision problems.  You have a stiff neck.  You have difficulty breathing.   This information is not intended to replace advice given to you by your health care provider. Make sure you discuss any questions you have with your health care provider.   Document Released: 04/14/2005 Document Revised: 05/05/2014 Document Reviewed: 04/29/2011 Elsevier Interactive Patient Education [employer].

## 2015-03-07 NOTE — Telephone Encounter (Signed)
Called and notified patient of Kate's comments. Patient verbalized understanding.  

## 2015-03-07 NOTE — Telephone Encounter (Signed)
Please notify Ms. Regina Chapman that I've sent in phenergan 12.5 mg tablets for nausea. Caution as this will make her drowsy. Thanks.

## 2015-03-07 NOTE — Telephone Encounter (Signed)
Zophran ODT is not covered by the patient's insurance.  Okay to change to Phenergan 25 mg?

## 2015-03-07 NOTE — Progress Notes (Signed)
Pre visit review using our clinic review tool, if applicable. No additional management support is needed unless otherwise documented below in the visit note. 

## 2015-03-12 ENCOUNTER — Encounter: Payer: Self-pay | Admitting: Primary Care

## 2015-04-04 ENCOUNTER — Encounter: Payer: Self-pay | Admitting: Family Medicine

## 2015-04-04 ENCOUNTER — Ambulatory Visit (INDEPENDENT_AMBULATORY_CARE_PROVIDER_SITE_OTHER): Payer: Medicare Other | Admitting: Family Medicine

## 2015-04-04 VITALS — BP 144/86 | HR 75 | Temp 98.7°F | Ht 63.0 in | Wt 185.2 lb

## 2015-04-04 DIAGNOSIS — E785 Hyperlipidemia, unspecified: Secondary | ICD-10-CM | POA: Diagnosis not present

## 2015-04-04 DIAGNOSIS — D1430 Benign neoplasm of unspecified bronchus and lung: Secondary | ICD-10-CM

## 2015-04-04 DIAGNOSIS — R05 Cough: Secondary | ICD-10-CM

## 2015-04-04 DIAGNOSIS — J019 Acute sinusitis, unspecified: Secondary | ICD-10-CM | POA: Diagnosis not present

## 2015-04-04 DIAGNOSIS — C7B00 Secondary carcinoid tumors, unspecified site: Secondary | ICD-10-CM | POA: Insufficient documentation

## 2015-04-04 DIAGNOSIS — R059 Cough, unspecified: Secondary | ICD-10-CM

## 2015-04-04 DIAGNOSIS — D3A090 Benign carcinoid tumor of the bronchus and lung: Secondary | ICD-10-CM

## 2015-04-04 LAB — COMPREHENSIVE METABOLIC PANEL
ALT: 22 U/L (ref 0–35)
AST: 20 U/L (ref 0–37)
Albumin: 3.8 g/dL (ref 3.5–5.2)
Alkaline Phosphatase: 53 U/L (ref 39–117)
BILIRUBIN TOTAL: 0.4 mg/dL (ref 0.2–1.2)
BUN: 15 mg/dL (ref 6–23)
CALCIUM: 9.4 mg/dL (ref 8.4–10.5)
CHLORIDE: 103 meq/L (ref 96–112)
CO2: 31 meq/L (ref 19–32)
Creatinine, Ser: 0.69 mg/dL (ref 0.40–1.20)
GFR: 90.71 mL/min (ref >60.00)
GLUCOSE: 101 mg/dL — AB (ref 70–99)
POTASSIUM: 3.5 meq/L (ref 3.5–5.1)
Sodium: 141 mEq/L (ref 135–145)
Total Protein: 7.2 g/dL (ref 6.0–8.3)

## 2015-04-04 LAB — LIPID PANEL
CHOL/HDL RATIO: 5
Cholesterol: 187 mg/dL (ref 0–200)
HDL: 34.9 mg/dL — AB (ref >39.00)
LDL CALC: 131 mg/dL — AB (ref 0–99)
NONHDL: 152.2
TRIGLYCERIDES: 106 mg/dL (ref 0.0–149.0)
VLDL: 21.2 mg/dL (ref 0.0–40.0)

## 2015-04-04 MED ORDER — MOXIFLOXACIN HCL 400 MG PO TABS: 400.0000 mg | ORAL_TABLET | Freq: Every day | ORAL | Status: DC

## 2015-04-04 MED ORDER — HYDROCODONE-HOMATROPINE 5-1.5 MG/5ML PO SYRP: 5.0000 mL | ORAL_SOLUTION | Freq: Every evening | ORAL | Status: DC | PRN

## 2015-04-04 NOTE — Progress Notes (Signed)
Pre visit review using our clinic review tool, if applicable. No additional management support is needed unless otherwise documented below in the visit note. 

## 2015-04-04 NOTE — Assessment & Plan Note (Addendum)
Given > 3 weeks symptoms, not better with amox, unilateral pain and fever... Broaden antibiotics. Treat with nasal saline,  Nasal steroid and mucolytic.

## 2015-04-04 NOTE — Patient Instructions (Addendum)
Complete antibitoics x 7 days  Nasal saline irrigation or spray 2-3 times a day.  Start flonase 2 spray per nostril daily.  Mucinex DM during the day and cough suppressant at night.  Stop at lab on way out, schedule physical as planned with Dr. [name].  Call if not improving as expected or fever.

## 2015-04-04 NOTE — Addendum Note (Signed)
Addended by: Marchia Bond on: 04/04/2015 02:15 PM   Modules accepted: Miquel Dunn

## 2015-04-04 NOTE — Progress Notes (Signed)
   Subjective:    Patient ID: Regina Chapman, female    DOB: 19-Aug-1950, 64 y.o.   MRN: [id number]  Cough This is a new problem. The current episode started more than 1 month ago. The problem has been unchanged. The problem occurs hourly. The cough is productive of sputum. Associated symptoms include ear congestion, a fever, nasal congestion, postnasal drip and a sore throat. Pertinent negatives include no chills, ear pain, hemoptysis, rash, shortness of breath or wheezing. Associated symptoms comments: Low grade subjective  hoarseness. The symptoms are aggravated by lying down. Risk factors: nonsmoker. She has tried prescription cough suppressant and OTC cough suppressant (amoxicillin) for the symptoms. The treatment provided no relief. There is no history of asthma, bronchiectasis or bronchitis.  Sore Throat  Associated symptoms include coughing. Pertinent negatives include no ear pain or shortness of breath.   She was treated on 11/9 for acute sinusitis with amox....never really improved from that time. Had fever 101 for 1 week after antibiotics started.   Social History /Family History/Past Medical History reviewed and updated if needed.  Review of Systems  Constitutional: Positive for fever. Negative for chills.  HENT: Positive for postnasal drip and sore throat. Negative for ear pain.   Respiratory: Positive for cough. Negative for hemoptysis, shortness of breath and wheezing.   Skin: Negative for rash.       Objective:   Physical Exam  Constitutional: Vital signs are normal. She appears well-developed and well-nourished. She is cooperative.  Non-toxic appearance. She does not appear ill. No distress.  HENT:  Head: Normocephalic.  Right Ear: Hearing, tympanic membrane, external ear and ear canal normal. Tympanic membrane is not erythematous, not retracted and not bulging.  Left Ear: Hearing, tympanic membrane, external ear and ear canal normal. Tympanic membrane is not  erythematous, not retracted and not bulging.  Nose: Mucosal edema and rhinorrhea present. Right sinus exhibits no maxillary sinus tenderness and no frontal sinus tenderness. Left sinus exhibits maxillary sinus tenderness. Left sinus exhibits no frontal sinus tenderness.  Mouth/Throat: Uvula is midline, oropharynx is clear and moist and mucous membranes are normal.  Eyes: Conjunctivae, EOM and lids are normal. Pupils are equal, round, and reactive to light. Lids are everted and swept, no foreign bodies found.  Neck: Trachea normal and normal range of motion. Neck supple. Carotid bruit is not present. No thyroid mass and no thyromegaly present.  Cardiovascular: Normal rate, regular rhythm, S1 normal, S2 normal, normal heart sounds, intact distal pulses and normal pulses.  Exam reveals no gallop and no friction rub.   No murmur heard. Pulmonary/Chest: Effort normal and breath sounds normal. No tachypnea. No respiratory distress. She has no decreased breath sounds. She has no wheezes. She has no rhonchi. She has no rales.  Neurological: She is alert.  Skin: Skin is warm, dry and intact. No rash noted.  Psychiatric: Her speech is normal and behavior is normal. Judgment normal. Her mood appears not anxious. Cognition and memory are normal. She does not exhibit a depressed mood.          Assessment & Plan:

## 2015-06-02 ENCOUNTER — Other Ambulatory Visit: Payer: Self-pay | Admitting: Internal Medicine

## 2015-06-04 ENCOUNTER — Ambulatory Visit (INDEPENDENT_AMBULATORY_CARE_PROVIDER_SITE_OTHER)
Admission: RE | Admit: 2015-06-04 | Discharge: 2015-06-04 | Disposition: A | Discharge status: Home / Self Care | Payer: Medicare Other | Source: Ambulatory Visit | Attending: Family Medicine | Admitting: Family Medicine

## 2015-06-04 ENCOUNTER — Encounter: Payer: Self-pay | Admitting: Family Medicine

## 2015-06-04 ENCOUNTER — Ambulatory Visit (INDEPENDENT_AMBULATORY_CARE_PROVIDER_SITE_OTHER): Payer: Medicare Other | Admitting: Family Medicine

## 2015-06-04 VITALS — BP 118/78 | HR 81 | Temp 98.4°F | Ht 63.0 in | Wt 186.6 lb

## 2015-06-04 DIAGNOSIS — N200 Calculus of kidney: Secondary | ICD-10-CM | POA: Diagnosis not present

## 2015-06-04 DIAGNOSIS — R300 Dysuria: Secondary | ICD-10-CM

## 2015-06-04 DIAGNOSIS — R3915 Urgency of urination: Secondary | ICD-10-CM | POA: Diagnosis not present

## 2015-06-04 LAB — POCT URINALYSIS DIPSTICK
BILIRUBIN UA: NEGATIVE
Glucose, UA: NEGATIVE
Ketones, UA: NEGATIVE
LEUKOCYTES UA: NEGATIVE
NITRITE UA: NEGATIVE
Protein, UA: NEGATIVE
Spec Grav, UA: 1.015
Urobilinogen, UA: 0.2
pH, UA: 7

## 2015-06-04 MED ORDER — CEPHALEXIN 500 MG PO CAPS: 500.0000 mg | ORAL_CAPSULE | Freq: Two times a day (BID) | ORAL | Status: DC

## 2015-06-04 NOTE — Progress Notes (Signed)
Pre visit review using our clinic review tool, if applicable. No additional management support is needed unless otherwise documented below in the visit note. 

## 2015-06-04 NOTE — Patient Instructions (Addendum)
Nice to meet you. We will check her urine for an infection. We are going to treat you with keflex for a urine infection. We're going get an x-ray to evaluate for kidney stone. Please call us with the information regarding your constipation medicine. If you develop abdominal pain, fevers, chills, inability to pass urine or, or any new or change in symptoms please seek medical attention.

## 2015-06-05 LAB — URINALYSIS, MICROSCOPIC ONLY

## 2015-06-05 LAB — URINE CULTURE
COLONY COUNT: NO GROWTH
ORGANISM ID, BACTERIA: NO GROWTH

## 2015-06-06 DIAGNOSIS — R3915 Urgency of urination: Secondary | ICD-10-CM | POA: Insufficient documentation

## 2015-06-06 NOTE — Progress Notes (Signed)
Patient ID: Regina Chapman, female   DOB: [birth year], 65 y.o.   MRN: [id number]  Tommi Rumps, MD Phone: [phone]  Regina Chapman is a 65 y.o. female who presents today for same-day visit.  Patient notes a couple of days of left low back pain associated with dysuria intermittently. Some frequency. Some urgency. Notes mild suprapubic discomfort as well. No fevers. No gross hematuria. She notes a history of kidney stones and has previously had lithotripsy and cystoscopy for this. She has had a total hysterectomy and bilateral salpingo-oophorectomy. She notes some constipation with all this. When she has a bowel movement that relieves some of the back pain. She strains some to have a bowel movement. Has hard bowel movements. She notes her symptoms are similar to prior UTIs. She has previously taken Keflex and tolerated this. Denies numbness, weakness, saddle anesthesia, bowel and bladder incontinence, and fevers, she does have a history of cancer.  PMH: Nonsmoker   ROS see history of present illness  Objective  Physical Exam Filed Vitals:   06/04/15 1500  BP: 118/78  Pulse: 81  Temp: 98.4 F (36.9 C)    BP Readings from Last 3 Encounters:  06/04/15 118/78  04/04/15 144/86  03/07/15 132/84   Wt Readings from Last 3 Encounters:  06/04/15 186 lb 9.6 oz (84.641 kg)  04/04/15 185 lb 4 oz (84.029 kg)  03/07/15 188 lb 12.8 oz (85.639 kg)    Physical Exam  Constitutional: She is well-developed, well-nourished, and in no distress.  HENT:  Head: Normocephalic and atraumatic.  Cardiovascular: Normal rate, regular rhythm and normal heart sounds.  Exam reveals no gallop and no friction rub.   No murmur heard. Pulmonary/Chest: Effort normal and breath sounds normal. No respiratory distress. She has no wheezes. She has no rales.  Abdominal: Soft. Bowel sounds are normal. She exhibits no distension. There is no tenderness. There is no rebound and no guarding.  No CVA tenderness    Musculoskeletal:  No midline spine tenderness, no midline spine step-off, minimal muscular low back tenderness on the left, no erythema or swelling of the back  Neurological: She is alert.  5 out of 5 strength bilateral quads, hamstrings, plantar flexion, and dorsiflexion, sensation light touch intact bilaterally lower extremities, 2+ patellar reflexes  Skin: Skin is warm and dry. She is not diaphoretic.     Assessment/Plan: Please see individual problem list.  Urinary urgency Suspect be related to UTI given blood on UA and symptoms, though symptoms could be related to kidney stone as well given her history. We will obtain a KUB to evaluate for stone. We will start her on Keflex. We'll send the urine for culture and microscopy. I did discuss that given her history of cancer if her back pain persists despite treatment we would need to consider imaging her back. She has no midline back tenderness at this time. No red flags other than history of cancer. Neurologically intact. Given return precautions.    Orders Placed This Encounter  Procedures  . Urine Culture  . DG Abd 1 View    Standing Status: Future     Number of Occurrences: 1     Standing Expiration Date: 08/01/2016    Order Specific Question:  Reason for Exam (SYMPTOM  OR DIAGNOSIS REQUIRED)    Answer:  hematuria, history of kidney stone    Order Specific Question:  Preferred imaging location?    Answer:  [facility]  . Urine Microscopic Only  . POCT Urinalysis Dipstick  Meds ordered this encounter  Medications  . cephALEXin (KEFLEX) 500 MG capsule    Sig: Take 1 capsule (500 mg total) by mouth 2 (two) times daily.    Dispense:  14 capsule    Refill:  0     Tommi Rumps

## 2015-06-06 NOTE — Assessment & Plan Note (Signed)
Suspect be related to UTI given blood on UA and symptoms, though symptoms could be related to kidney stone as well given her history. We will obtain a KUB to evaluate for stone. We will start her on Keflex. We'll send the urine for culture and microscopy. I did discuss that given her history of cancer if her back pain persists despite treatment we would need to consider imaging her back. She has no midline back tenderness at this time. No red flags other than history of cancer. Neurologically intact. Given return precautions.

## 2015-06-26 ENCOUNTER — Ambulatory Visit (INDEPENDENT_AMBULATORY_CARE_PROVIDER_SITE_OTHER): Payer: Medicare Other | Admitting: Primary Care

## 2015-06-26 ENCOUNTER — Encounter: Payer: Self-pay | Admitting: Primary Care

## 2015-06-26 VITALS — BP 132/80 | HR 80 | Temp 98.3°F | Resp 16 | Ht 63.0 in | Wt 185.1 lb

## 2015-06-26 DIAGNOSIS — J3489 Other specified disorders of nose and nasal sinuses: Secondary | ICD-10-CM | POA: Diagnosis not present

## 2015-06-26 MED ORDER — PREDNISONE 10 MG PO TABS: ORAL_TABLET | ORAL | Status: DC

## 2015-06-26 NOTE — Patient Instructions (Signed)
Your symptoms are related to a viral infection called Viral Sinusitis. This infection is caused by a virus which cannot be treated with antibiotics. Sometimes these infections are caused by allergens in the air.  Nasal Congestion/Sinus Pressure: Start Prednisone tablets. Take 3 tablets for 2 days, then 2 tablets for 2 days, then 1 tablet for 2 days. Also continue  Neti Pot Rinses to help flush out the sinuses.   Cough/Congestion: Continue taking Mucinex DM. This will help loosen up any mucous in your chest. Ensure you take this medication with a full glass of water.  You may also take Robitussin or Delsym for your cough. These may be purchased over the counter.   Please notify me if you develop persistent fevers of 101, start coughing up green mucous, or no improvement from 10 days of symptoms.   Increase consumption of water intake and rest.  It was a pleasure to see you today!

## 2015-06-26 NOTE — Progress Notes (Signed)
Subjective:    Patient ID: Regina Chapman, female    DOB: 09/30/1950, 65 y.o.   MRN: [id number]  HPI  Regina Chapman is a 65 year old female who presents today with a chief complaint of facial pain. She also reports sinus pressure, nasal congestion, cough, sore throat, body aches. Her symptoms began Friday last week. Her cough is non productive. She's blowing light green mucus from her nose. Her most bothersome symptom is her facial pain.   She's had a low grade fever for 3 days, but no fever this morning when waking. Overall her symptoms are no worse, and is feeling better than yesterday. She's taken tylenol, aleve, mucinex, essential oils, and steroid nasal spray with temporary improvement.   Review of Systems  Constitutional: Negative for fever, chills and fatigue.  HENT: Positive for congestion, sinus pressure and sore throat. Negative for facial swelling.   Respiratory: Positive for cough. Negative for shortness of breath.   Cardiovascular: Negative for chest pain.  Gastrointestinal: Negative for nausea.  Musculoskeletal: Positive for myalgias.       Past Medical History  Diagnosis Date  . History of melanoma 1985  . Kidney stones   . Psoriasis   . Carcinoid tumor 2005  . Obesity   . Depression   . Hyperlipidemia   . Hypertension   . Obesity     Social History   Social History  . Marital Status: Widowed    Spouse Name: N/A  . Number of Children: 1  . Years of Education: N/A   Occupational History  . Accounting and customer service     Retired 2013   Social History Main Topics  . Smoking status: Never Smoker   . Smokeless tobacco: Never Used  . Alcohol Use: Yes     Comment: 3 PER WEEK  . Drug Use: No  . Sexual Activity: No   Other Topics Concern  . Not on file   Social History Narrative    Past Surgical History  Procedure Laterality Date  . Melanoma excision  1985  . Lung removal, partial    . Laparoscopic assisted vaginal hysterectomy  1987    FOR  ENDOMETRIOSIS  . Cataract extraction w/ intraocular lens  implant, bilateral  2013    Family History  Problem Relation Age of Onset  . Cancer Sister     CERVICAL   . Heart disease Mother   . Dementia Mother   . Diabetes Neg Hx   . Hypertension Neg Hx   . Cancer Sister     Allergies  Allergen Reactions  . Amoxicillin-Pot Clavulanate     REACTION: GI--not allergy (tolerates Amoxil)    Current Outpatient Prescriptions on File Prior to Visit  Medication Sig Dispense Refill  . aspirin 81 MG tablet Take 81 mg by mouth daily.      Marland Kitchen buPROPion (WELLBUTRIN XL) 150 MG 24 hr tablet TAKE 1 TABLET BY MOUTH TWICE DAILY 60 tablet 0  . estradiol (ESTRACE) 0.5 MG tablet Take 1 tablet (0.5 mg total) by mouth daily. 90 tablet 1  . hydrochlorothiazide (HYDRODIURIL) 25 MG tablet Take 1 tablet (25 mg total) by mouth daily. 90 tablet 3  . moxifloxacin (AVELOX) 400 MG tablet Take 1 tablet (400 mg total) by mouth daily at 8 pm. 7 tablet 0  . traZODone (DESYREL) 50 MG tablet Take 1-2 tablets (50-100 mg total) by mouth at bedtime as needed. 180 tablet 3   No current facility-administered medications on file prior to  visit.    BP 132/80 mmHg  Pulse 80  Temp(Src) 98.3 F (36.8 C) (Oral)  Resp 16  Ht '5\' 3"'$  (1.6 m)  Wt 185 lb 1.9 oz (83.97 kg)  BMI 32.80 kg/m2  SpO2 98%    Objective:   Physical Exam  Constitutional: She appears well-nourished.  HENT:  Right Ear: Tympanic membrane and ear canal normal.  Left Ear: Tympanic membrane and ear canal normal.  Nose: Right sinus exhibits maxillary sinus tenderness and frontal sinus tenderness. Left sinus exhibits maxillary sinus tenderness and frontal sinus tenderness.  Mouth/Throat: Oropharynx is clear and moist.  Eyes: Conjunctivae are normal.  Neck: Neck supple.  Cardiovascular: Normal rate and regular rhythm.   Pulmonary/Chest: Effort normal and breath sounds normal. She has no wheezes. She has no rales.  Lymphadenopathy:    She has no cervical  adenopathy.  Skin: Skin is warm and dry.          Assessment & Plan:  Viral vs Allergic Sinusitis:  Sinus pressure, cough, low grade fevers, nasal congestion x 4 days. Temporary improvement on OTC's, feeling better today. Exam with clear lungs. Tenderness over maxillary sinuses, otherwise unremarkable exam. No fevers today, does not appear sickly. Suspect viral or allergy induced sinusitis. Continue Neti Pot rinses, Mucinex. RX for prednisone sent for pressure/congestion as this seems moderate in intensity. Return precautions provided.

## 2015-06-26 NOTE — Progress Notes (Signed)
Pre visit review using our clinic review tool, if applicable. No additional management support is needed unless otherwise documented below in the visit note. 

## 2015-06-27 ENCOUNTER — Other Ambulatory Visit: Payer: Self-pay

## 2015-06-27 ENCOUNTER — Other Ambulatory Visit (HOSPITAL_COMMUNITY): Payer: Self-pay | Admitting: Obstetrics & Gynecology

## 2015-06-27 MED ORDER — BUPROPION HCL ER (XL) 150 MG PO TB24: 150.0000 mg | ORAL_TABLET | Freq: Two times a day (BID) | ORAL | Status: DC

## 2015-06-27 MED ORDER — HYDROCHLOROTHIAZIDE 25 MG PO TABS: 25.0000 mg | ORAL_TABLET | Freq: Every day | ORAL | Status: DC

## 2015-06-27 NOTE — Telephone Encounter (Signed)
Pt left note requesting refill bupropion (last refilled # 60 on 06/04/15) and HCTZ (last refilled # 90 x 3 on 07/24/2014). Pt last annual exam on 04/07/14. Last acute visit 06/26/15.No future appt scheduled.Please advise.

## 2015-06-27 NOTE — Telephone Encounter (Signed)
Approved: schedule her for wellness visit--- fill enough to hold her till the appt

## 2015-06-27 NOTE — Telephone Encounter (Signed)
Calling pt to schedule appt with Dr. [name], .left message to have patient return my call.

## 2015-06-27 NOTE — Telephone Encounter (Signed)
appt scheduled 07/20/2015 @ 10:30am  rx sent to pharmacy by e-script

## 2015-07-02 ENCOUNTER — Telehealth: Payer: Self-pay | Admitting: Internal Medicine

## 2015-07-02 DIAGNOSIS — J019 Acute sinusitis, unspecified: Secondary | ICD-10-CM

## 2015-07-02 MED ORDER — AZITHROMYCIN 250 MG PO TABS: ORAL_TABLET | ORAL | Status: DC

## 2015-07-02 NOTE — Telephone Encounter (Signed)
Patient returned Chan's call.  Call patient back at [phone].

## 2015-07-02 NOTE — Telephone Encounter (Signed)
Called and notified patient of Kate's comments. Patient verbalized understanding.  

## 2015-07-02 NOTE — Telephone Encounter (Signed)
Message left for patient to return my call.  

## 2015-07-02 NOTE — Telephone Encounter (Signed)
Pt called stating she is not any better and wanted to know if you would call her in something  [facility] in [place]

## 2015-07-02 NOTE — Telephone Encounter (Signed)
Will send in [place] antibiotics for symptoms. She is to take 2 tablets by mouth on day 1, then 1 tablet daily for 4 additional days.

## 2015-07-18 ENCOUNTER — Encounter: Payer: Self-pay | Admitting: Obstetrics & Gynecology

## 2015-07-18 ENCOUNTER — Ambulatory Visit (INDEPENDENT_AMBULATORY_CARE_PROVIDER_SITE_OTHER): Payer: Medicare Other | Admitting: Obstetrics & Gynecology

## 2015-07-18 VITALS — BP 151/84 | HR 79 | Resp 16 | Ht 63.0 in | Wt 188.0 lb

## 2015-07-18 DIAGNOSIS — Z79890 Hormone replacement therapy (postmenopausal): Secondary | ICD-10-CM

## 2015-07-18 DIAGNOSIS — E8940 Asymptomatic postprocedural ovarian failure: Secondary | ICD-10-CM

## 2015-07-18 DIAGNOSIS — Z01419 Encounter for gynecological examination (general) (routine) without abnormal findings: Secondary | ICD-10-CM | POA: Diagnosis not present

## 2015-07-18 MED ORDER — ESTRADIOL 0.5 MG PO TABS: 0.5000 mg | ORAL_TABLET | Freq: Every day | ORAL | Status: DC

## 2015-07-18 NOTE — Progress Notes (Signed)
Subjective:    Regina Chapman is a 65 y.o. [place] female who presents for an annual exam. The patient has no complaints today. The patient is not currently sexually active. GYN screening history: last pap: was normal. The patient wears seatbelts: yes. The patient participates in regular exercise: yes. Has the patient ever been transfused or tattooed?: no. The patient reports that there is not domestic violence in her life.   Menstrual History: OB History    Gravida Para Term Preterm AB TAB SAB Ectopic Multiple Living   '1 1 1 '$ 0 0 0 0 0 0 1      Menarche age: 43  No LMP recorded. Patient has had a hysterectomy.    The following portions of the patient's history were reviewed and updated as appropriate: allergies, current medications, past family history, past medical history, past social history, past surgical history and problem list.  Review of Systems Pertinent items noted in HPI and remainder of comprehensive ROS otherwise negative.    Objective:    BP 151/84 mmHg  Pulse 79  Resp 16  Ht '5\' 3"'$  (1.6 m)  Wt 188 lb (85.276 kg)  BMI 33.31 kg/m2  General Appearance:    Alert, cooperative, no distress, appears stated age  Head:    Normocephalic, without obvious abnormality, atraumatic  Eyes:    PERRL, conjunctiva/corneas clear, EOM's intact, fundi    benign, both eyes  Ears:    Normal TM's and external ear canals, both ears  Nose:   Nares normal, septum midline, mucosa normal, no drainage    or sinus tenderness  Throat:   Lips, mucosa, and tongue normal; teeth and gums normal  Neck:   Supple, symmetrical, trachea midline, no adenopathy;    thyroid:  no enlargement/tenderness/nodules; no carotid   bruit or JVD  Back:     Symmetric, no curvature, ROM normal, no CVA tenderness  Lungs:     Clear to auscultation bilaterally, respirations unlabored  Chest Wall:    No tenderness or deformity   Heart:    Regular rate and rhythm, S1 and S2 normal, no murmur, rub   or gallop  Breast Exam:     No tenderness, masses, or nipple abnormality  Abdomen:     Soft, non-tender, bowel sounds active all four quadrants,    no masses, no organomegaly  Genitalia:    Normal female without lesion, discharge or tenderness, Normal appearing vagina and normal bimanual exam     Extremities:   Extremities normal, atraumatic, no cyanosis or edema  Pulses:   2+ and symmetric all extremities  Skin:   Skin color, texture, turgor normal, no rashes or lesions  Lymph nodes:   Cervical, supraclavicular, and axillary nodes normal  Neurologic:   CNII-XII intact, normal strength, sensation and reflexes    throughout  .    Assessment:    Healthy female exam.    Plan:     Mammogram

## 2015-07-20 ENCOUNTER — Encounter: Payer: Self-pay | Admitting: Internal Medicine

## 2015-07-20 ENCOUNTER — Ambulatory Visit (INDEPENDENT_AMBULATORY_CARE_PROVIDER_SITE_OTHER): Payer: Medicare Other | Admitting: Internal Medicine

## 2015-07-20 VITALS — BP 130/88 | HR 70 | Temp 98.2°F | Ht 62.0 in | Wt 187.0 lb

## 2015-07-20 DIAGNOSIS — E785 Hyperlipidemia, unspecified: Secondary | ICD-10-CM

## 2015-07-20 DIAGNOSIS — D1430 Benign neoplasm of unspecified bronchus and lung: Secondary | ICD-10-CM | POA: Diagnosis not present

## 2015-07-20 DIAGNOSIS — Z23 Encounter for immunization: Secondary | ICD-10-CM

## 2015-07-20 DIAGNOSIS — F3340 Major depressive disorder, recurrent, in remission, unspecified: Secondary | ICD-10-CM

## 2015-07-20 DIAGNOSIS — Z0000 Encounter for general adult medical examination without abnormal findings: Secondary | ICD-10-CM

## 2015-07-20 DIAGNOSIS — I10 Essential (primary) hypertension: Secondary | ICD-10-CM

## 2015-07-20 DIAGNOSIS — Z780 Asymptomatic menopausal state: Secondary | ICD-10-CM

## 2015-07-20 DIAGNOSIS — Z7189 Other specified counseling: Secondary | ICD-10-CM

## 2015-07-20 DIAGNOSIS — Z1211 Encounter for screening for malignant neoplasm of colon: Secondary | ICD-10-CM

## 2015-07-20 MED ORDER — HYDROCHLOROTHIAZIDE 25 MG PO TABS: 25.0000 mg | ORAL_TABLET | Freq: Every day | ORAL | Status: DC

## 2015-07-20 MED ORDER — BUPROPION HCL ER (XL) 150 MG PO TB24: 150.0000 mg | ORAL_TABLET | Freq: Two times a day (BID) | ORAL | Status: DC

## 2015-07-20 MED ORDER — TRAZODONE HCL 50 MG PO TABS: 50.0000 mg | ORAL_TABLET | Freq: Every evening | ORAL | Status: DC | PRN

## 2015-07-20 NOTE — Assessment & Plan Note (Signed)
Asked her to try to wean estrogen

## 2015-07-20 NOTE — Assessment & Plan Note (Signed)
I have personally reviewed the Medicare Annual Wellness questionnaire and have noted 1. The patient's medical and social history 2. Their use of alcohol, tobacco or illicit drugs 3. Their current medications and supplements 4. The patient's functional ability including ADL's, fall risks, home safety risks and hearing or visual             impairment. 5. Diet and physical activities 6. Evidence for depression or mood disorders  The patients weight, height, BMI and visual acuity have been recorded in the chart I have made referrals, counseling and provided education to the patient based review of the above and I have provided the pt with a written personalized care plan for preventive services.  I have provided you with a copy of your personalized plan for preventive services. Please take the time to review along with your updated medication list.  Needs prevnar Has mammogram set up for next month Doesn't want colon---will do FIT No pap due to hyster Discussed fitness Check DEXA

## 2015-07-20 NOTE — Assessment & Plan Note (Signed)
See social history Blank forms given 

## 2015-07-20 NOTE — Assessment & Plan Note (Signed)
Yearly follow up at [facility]

## 2015-07-20 NOTE — Assessment & Plan Note (Signed)
Lab Results  Component Value Date   LDLCALC 131* 04/04/2015   On omega 3 only

## 2015-07-20 NOTE — Progress Notes (Signed)
Pre visit review using our clinic review tool, if applicable. No additional management support is needed unless otherwise documented below in the visit note. 

## 2015-07-20 NOTE — Addendum Note (Signed)
Addended by: Pilar Grammes on: 07/20/2015 11:34 AM   Modules accepted: Orders

## 2015-07-20 NOTE — Progress Notes (Signed)
Subjective:    Patient ID: Regina Chapman, female    DOB: 1951/01/10, 65 y.o.   MRN: [id number]  HPI Here for initial Welcome to Medicare visit and follow up of chronic medical conditions Reviewed form and advanced directives Reviewed other doctors No tobacco Occasional drink of alcohol Exercises sporadically No falls No vision or hearing problems of note Independent with instrumental ADLs No sig issues with memory  Mood has been "pretty good" Feels more comfortable staying on the medication Family responsibilities Not anhedonic  Has been on estrogen since hysterectomy Dose was cut in half about a year ago  HCTZ initially for swelling No chest pain No SOB No dizziness or syncope No edema  Takes omega for her cholesterol  No evidence of recurrence of lung carcinoid Gets yearly check up for that  Current Outpatient Prescriptions on File Prior to Visit  Medication Sig Dispense Refill  . aspirin 81 MG tablet Take 81 mg by mouth daily.      Marland Kitchen buPROPion (WELLBUTRIN XL) 150 MG 24 hr tablet Take 1 tablet (150 mg total) by mouth 2 (two) times daily. 60 tablet 0  . estradiol (ESTRACE) 0.5 MG tablet Take 1 tablet (0.5 mg total) by mouth daily. 90 tablet 6  . hydrochlorothiazide (HYDRODIURIL) 25 MG tablet Take 1 tablet (25 mg total) by mouth daily. 30 tablet 0  . traZODone (DESYREL) 50 MG tablet Take 1-2 tablets (50-100 mg total) by mouth at bedtime as needed. 180 tablet 3   No current facility-administered medications on file prior to visit.    Allergies  Allergen Reactions  . Augmentin [Amoxicillin-Pot Clavulanate] Nausea And Vomiting    Able to tolerate Amoxil    Past Medical History  Diagnosis Date  . History of melanoma 1985  . Kidney stones   . Psoriasis   . Carcinoid tumor 2005  . Obesity   . Depression   . Hyperlipidemia   . Hypertension   . Obesity     Past Surgical History  Procedure Laterality Date  . Melanoma excision  1985  . Lung removal,  partial    . Laparoscopic assisted vaginal hysterectomy  1987    FOR ENDOMETRIOSIS  . Cataract extraction w/ intraocular lens  implant, bilateral  2013    Family History  Problem Relation Age of Onset  . Cancer Sister     CERVICAL   . Heart disease Mother   . Dementia Mother   . Diabetes Neg Hx   . Hypertension Neg Hx   . Cancer Sister     Social History   Social History  . Marital Status: Widowed    Spouse Name: N/A  . Number of Children: 1  . Years of Education: N/A   Occupational History  . Accounting and customer service     Retired 2013   Social History Main Topics  . Smoking status: Never Smoker   . Smokeless tobacco: Never Used  . Alcohol Use: Yes     Comment: 3 PER WEEK  . Drug Use: No  . Sexual Activity: No   Other Topics Concern  . Not on file   Social History Narrative   No living will or health care POA   Would want son to make decisions   Would accept resuscitation but no prolonged ventilation   No tube feeds if cognitively unaware   Review of Systems Watches 68 year old grandson 4 days a week Appetite is good Weight is stable Sleeps okay with trazodone Wears  seat belt Constipation at times. Uses OTC med as needed. No blood Voids okay. No dysuria. Kidney stone recently-- seen at [facility] Occasional back pain. No other sig arthritis Psoriasis--has Rx cream from Dr Nicole Kindred    Objective:   Physical Exam  Constitutional: She is oriented to person, place, and time. She appears well-developed and well-nourished. No distress.  HENT:  Mouth/Throat: Oropharynx is clear and moist. No oropharyngeal exudate.  Neck: Normal range of motion. Neck supple. No thyromegaly present.  Cardiovascular: Normal rate, regular rhythm, normal heart sounds and intact distal pulses.  Exam reveals no gallop.   No murmur heard. Pulmonary/Chest: Effort normal and breath sounds normal. No respiratory distress. She has no wheezes. She has no rales.  Abdominal:  Soft. There is no tenderness.  Musculoskeletal: She exhibits no edema or tenderness.  Lymphadenopathy:    She has no cervical adenopathy.  Neurological: She is alert and oriented to person, place, and time.  President -- "Trump, Obama, ?" 102-72-53, no 81 D-[id number] Recall 2/3  Skin: No rash noted. No erythema.  Psychiatric: She has a normal mood and affect. Her behavior is normal.          Assessment & Plan:

## 2015-07-20 NOTE — Assessment & Plan Note (Signed)
BP Readings from Last 3 Encounters:  07/20/15 130/88  07/18/15 151/84  06/26/15 132/80   Diuretic mostly for edema Recent labs fine

## 2015-07-20 NOTE — Assessment & Plan Note (Signed)
Will continue her meds for now

## 2015-07-20 NOTE — Patient Instructions (Addendum)
Please decrease the estradiol to 6 days a week. If no problems with this in a month, go down to 5 days a week (and then keep weaning to the lowest dose that you don't have menopausal symptoms). Please consider [facility]  [place] stands for "Dietary Approaches to Stop Hypertension." The DASH eating plan is a healthy eating plan that has been shown to reduce high blood pressure (hypertension). Additional health benefits may include reducing the risk of type 2 diabetes mellitus, heart disease, and stroke. The DASH eating plan may also help with weight loss. WHAT DO I NEED TO KNOW ABOUT THE DASH EATING PLAN? For the DASH eating plan, you will follow these general guidelines:  Choose foods with a percent daily value for sodium of less than 5% (as listed on the food label).  Use salt-free seasonings or herbs instead of table salt or sea salt.  Check with your health care provider or pharmacist before using salt substitutes.  Eat lower-sodium products, often labeled as "lower sodium" or "no salt added."  Eat fresh foods.  Eat more vegetables, fruits, and low-fat dairy products.  Choose whole grains. Look for the word "whole" as the first word in the ingredient list.  Choose fish and skinless chicken or [place] more often than red meat. Limit fish, poultry, and meat to 6 oz (170 g) each day.  Limit sweets, desserts, sugars, and sugary drinks.  Choose heart-healthy fats.  Limit cheese to 1 oz (28 g) per day.  Eat more home-cooked food and less restaurant, buffet, and fast food.  Limit fried foods.  Cook foods using methods other than frying.  Limit canned vegetables. If you do use them, rinse them well to decrease the sodium.  When eating at a restaurant, ask that your food be prepared with less salt, or no salt if possible. WHAT FOODS CAN I EAT? Seek help from a dietitian for individual calorie needs. Grains Whole grain or whole wheat bread. Brown rice.  Whole grain or whole wheat pasta. Quinoa, bulgur, and whole grain cereals. Low-sodium cereals. Corn or whole wheat flour tortillas. Whole grain cornbread. Whole grain crackers. Low-sodium crackers. Vegetables Fresh or frozen vegetables (raw, steamed, roasted, or grilled). Low-sodium or reduced-sodium tomato and vegetable juices. Low-sodium or reduced-sodium tomato sauce and paste. Low-sodium or reduced-sodium canned vegetables.  Fruits All fresh, canned (in natural juice), or frozen fruits. Meat and Other Protein Products Ground beef (85% or leaner), grass-fed beef, or beef trimmed of fat. Skinless chicken or [place]. Ground chicken or [place]. Pork trimmed of fat. All fish and seafood. Eggs. Dried beans, peas, or lentils. Unsalted nuts and seeds. Unsalted canned beans. Dairy Low-fat dairy products, such as skim or 1% milk, 2% or reduced-fat cheeses, low-fat ricotta or cottage cheese, or plain low-fat yogurt. Low-sodium or reduced-sodium cheeses. Fats and Oils Tub margarines without trans fats. Light or reduced-fat mayonnaise and salad dressings (reduced sodium). Avocado. Safflower, olive, or canola oils. Natural peanut or almond butter. Other Unsalted popcorn and pretzels. The items listed above may not be a complete list of recommended foods or beverages. Contact your dietitian for more options. WHAT FOODS ARE NOT RECOMMENDED? Grains White bread. White pasta. White rice. Refined cornbread. Bagels and croissants. Crackers that contain trans fat. Vegetables Creamed or fried vegetables. Vegetables in a cheese sauce. Regular canned vegetables. Regular canned tomato sauce and paste. Regular tomato and vegetable juices. Fruits Dried fruits. Canned fruit in light or heavy syrup. Fruit juice. Meat and  Other Protein Products Fatty cuts of meat. Ribs, chicken wings, bacon, sausage, bologna, salami, chitterlings, fatback, hot dogs, bratwurst, and packaged luncheon meats. Salted nuts and seeds. Canned  beans with salt. Dairy Whole or 2% milk, cream, half-and-half, and cream cheese. Whole-fat or sweetened yogurt. Full-fat cheeses or blue cheese. Nondairy creamers and whipped toppings. Processed cheese, cheese spreads, or cheese curds. Condiments Onion and garlic salt, seasoned salt, table salt, and sea salt. Canned and packaged gravies. Worcestershire sauce. Tartar sauce. Barbecue sauce. Teriyaki sauce. Soy sauce, including reduced sodium. Steak sauce. Fish sauce. Oyster sauce. Cocktail sauce. Horseradish. Ketchup and mustard. Meat flavorings and tenderizers. Bouillon cubes. Hot sauce. Tabasco sauce. Marinades. Taco seasonings. Relishes. Fats and Oils Butter, stick margarine, lard, shortening, ghee, and bacon fat. Coconut, palm kernel, or palm oils. Regular salad dressings. Other Pickles and olives. Salted popcorn and pretzels. The items listed above may not be a complete list of foods and beverages to avoid. Contact your dietitian for more information. WHERE CAN I FIND MORE INFORMATION? National Heart, Lung, and Blood Institute: travelstabloid.com   This information is not intended to replace advice given to you by your health care provider. Make sure you discuss any questions you have with your health care provider.   Document Released: 04/03/2011 Document Revised: 05/05/2014 Document Reviewed: 02/16/2013 Elsevier Interactive Patient Education [employer].

## 2015-07-23 ENCOUNTER — Encounter: Payer: Self-pay | Admitting: *Deleted

## 2015-07-24 ENCOUNTER — Telehealth: Payer: Self-pay | Admitting: Internal Medicine

## 2015-07-24 NOTE — Telephone Encounter (Signed)
PLEASE NOTE: All timestamps contained within this report are represented as [place] Standard Time. CONFIDENTIALTY NOTICE: This fax transmission is intended only for the addressee. It contains information that is legally privileged, confidential or otherwise protected from use or disclosure. If you are not the intended recipient, you are strictly prohibited from reviewing, disclosing, copying using or disseminating any of this information or taking any action in reliance on or regarding this information. If you have received this fax in error, please notify us immediately by telephone so that we can arrange for its return to [place]. Phone: [phone], Toll-Free: [phone], Fax: [phone] Page: 1 of 1 Call Id: [id number] [place] Patient Name: Regina Chapman DOB: [birth year] Initial Comment caller states she thinks she had a reaction to pneumonia shot Nurse Assessment Nurse: Marcelline Deist, RN, Kermit Balo Date/Time (Eastern Time): 07/24/2015 10:15:12 AM Confirm and document reason for call. If symptomatic, describe symptoms. You must click the next button to save text entered. ---Caller states she thinks she had a reaction to pneumonia shot given on Friday. Was very fatigued afterwards that afternoon, then bad muscle aches. Her right arm was so sore, she couldn't sleep on it. Was also very nauseous with a headache. Her arm is swollen, warm to touch, and red all over on the upper area. Not sure if fever. Has the patient traveled out of the country within the last 30 days? ---Not Applicable Does the patient have any new or worsening symptoms? ---Yes Will a triage be completed? ---Yes Related visit to physician within the last 2 weeks? ---Yes Does the PT have any chronic conditions? (i.e. diabetes, asthma, etc.) ---Yes List chronic conditions. ---lung tumors, on BP rx Is this a behavioral health or substance  abuse call? ---No Guidelines Guideline Title Affirmed Question Affirmed Notes Immunization Reactions [1] Redness or red streak around the injection site AND [2] begins > 48 hours after shot AND [3] no fever (Exception: red area < 1 inch or 2.5 cm wide) Final Disposition User See Physician within [place], RN, [employer] states she can not be seen later in day tomorrow as she watches her grandson. Scheduled with Dr. Caryl Bis at [facility] office who she has seen in past. Has tried Ibuprofen for pain in arm d/t vaccine, advised to use cold pack on it as well in the meantime. Referrals REFERRED TO PCP OFFICE Disagree/Comply: Comply

## 2015-07-24 NOTE — Telephone Encounter (Signed)
Pt has appt with Dr Biagio Quint on 07/25/15.

## 2015-07-25 ENCOUNTER — Encounter: Payer: Self-pay | Admitting: Family Medicine

## 2015-07-25 ENCOUNTER — Ambulatory Visit (INDEPENDENT_AMBULATORY_CARE_PROVIDER_SITE_OTHER): Payer: Medicare Other | Admitting: Family Medicine

## 2015-07-25 VITALS — BP 130/86 | HR 80 | Temp 98.6°F | Ht 62.0 in | Wt 189.2 lb

## 2015-07-25 DIAGNOSIS — T8090XA Unspecified complication following infusion and therapeutic injection, initial encounter: Secondary | ICD-10-CM | POA: Diagnosis not present

## 2015-07-25 DIAGNOSIS — R52 Pain, unspecified: Secondary | ICD-10-CM

## 2015-07-25 LAB — POCT INFLUENZA A/B
Influenza A, POC: NEGATIVE
Influenza B, POC: NEGATIVE

## 2015-07-25 NOTE — Progress Notes (Signed)
Patient ID: Regina Chapman, female   DOB: [birth year], 65 y.o.   MRN: [id number]  Tommi Rumps, MD Phone: [phone]  Regina Chapman is a 65 y.o. female who presents today for same day visit.  Local injection reaction: patient was given a Prevnar vaccine on Friday. She notes relatively quickly after getting the vaccine she felt poorly. Notes some nausea and fatigue following this. Slept for several hours after getting home. She notes her right arm became sore as well. She had erythema surrounding the injection site. There is tenderness in this area as well. She's not had any fevers. She had some mild body aches. Minimal nonproductive cough. She notes she has been using cold compresses and Tylenol and Motrin for this. She notes the discomfort has improved significantly. The weakness and fatigue and nausea have resolved. She notes the erythema is much less than it was previously.  PMH: nonsmoker.   ROS see history of present illness  Objective  Physical Exam Filed Vitals:   07/25/15 0816  BP: 130/86  Pulse: 80  Temp: 98.6 F (37 C)    BP Readings from Last 3 Encounters:  07/25/15 130/86  07/20/15 130/88  07/18/15 151/84   Wt Readings from Last 3 Encounters:  07/25/15 189 lb 3.2 oz (85.821 kg)  07/20/15 187 lb (84.823 kg)  07/18/15 188 lb (85.276 kg)    Physical Exam  Constitutional: She is well-developed, well-nourished, and in no distress.  HENT:  Head: Normocephalic and atraumatic.  Cardiovascular: Normal rate, regular rhythm and normal heart sounds.  Exam reveals no gallop and no friction rub.   No murmur heard. Pulmonary/Chest: Effort normal and breath sounds normal. No respiratory distress. She has no wheezes. She has no rales.  Musculoskeletal:       Arms: Neurological: She is alert. Gait normal.  5 out of 5 strength bilateral biceps, triceps, and grip, sensation to light touch intact in bilateral upper extremities  Skin: She is not diaphoretic.      Assessment/Plan: Please see individual problem list.  Injection site reaction Patient's symptoms most consistent with reaction to pneumonia vaccine. Suspect injection site reaction given that it has improved with no intervention. Unlikely cellulitis given that the erythema has improved and there is no induration or warmth at this time. Given her constellation of symptoms she was swabbed for flu and this was negative. Patient was given information on Prevnar. She had a question of whether or not the injection liquid was thick or thin and I observed the Prevnar vaccine and it appears thin. She is advised to continue to monitor. She can use cold compresses and ice packs. Tylenol or ibuprofen for discomfort. Advised this should continue to get better. If this does not continue to improve she'll follow-up. Discussed return precautions.    Orders Placed This Encounter  Procedures  . POCT Influenza A/B    Tommi Rumps, MD [place]

## 2015-07-25 NOTE — Progress Notes (Signed)
Pre visit review using our clinic review tool, if applicable. No additional management support is needed unless otherwise documented below in the visit note. 

## 2015-07-25 NOTE — Patient Instructions (Signed)
Nice to meet you. You've likely had a reaction to the pneumonia vaccine. You should continue cold compresses and Tylenol and ibuprofen as needed for discomfort. You should continue to monitor the area. If the redness spreads or you develop fever, warmth to the area, increasing pain, or any new or changing symptoms please seek medical attention.

## 2015-07-25 NOTE — Assessment & Plan Note (Addendum)
Patient's symptoms most consistent with reaction to pneumonia vaccine. Suspect injection site reaction given that it has improved with no intervention. Unlikely cellulitis given that the erythema has improved and there is no induration or warmth at this time. Given her constellation of symptoms she was swabbed for flu and this was negative. Patient was given information on Prevnar. She had a question of whether or not the injection liquid was thick or thin and I observed the Prevnar vaccine and it appears thin. She is advised to continue to monitor. She can use cold compresses and ice packs. Tylenol or ibuprofen for discomfort. Advised this should continue to get better. If this does not continue to improve she'll follow-up. Discussed return precautions.

## 2015-07-31 ENCOUNTER — Ambulatory Visit
Admission: RE | Admit: 2015-07-31 | Discharge: 2015-07-31 | Disposition: A | Discharge status: Home / Self Care | Payer: Medicare Other | Source: Ambulatory Visit | Attending: Obstetrics & Gynecology | Admitting: Obstetrics & Gynecology

## 2015-07-31 ENCOUNTER — Ambulatory Visit
Admission: RE | Admit: 2015-07-31 | Discharge: 2015-07-31 | Disposition: A | Discharge status: Home / Self Care | Payer: Medicare Other | Source: Ambulatory Visit | Attending: Internal Medicine | Admitting: Internal Medicine

## 2015-07-31 ENCOUNTER — Other Ambulatory Visit: Payer: Self-pay | Admitting: Obstetrics & Gynecology

## 2015-07-31 DIAGNOSIS — Z13820 Encounter for screening for osteoporosis: Secondary | ICD-10-CM | POA: Diagnosis not present

## 2015-07-31 DIAGNOSIS — Z1231 Encounter for screening mammogram for malignant neoplasm of breast: Secondary | ICD-10-CM | POA: Diagnosis not present

## 2015-07-31 DIAGNOSIS — Z780 Asymptomatic menopausal state: Secondary | ICD-10-CM

## 2015-07-31 DIAGNOSIS — Z01419 Encounter for gynecological examination (general) (routine) without abnormal findings: Secondary | ICD-10-CM

## 2015-10-01 ENCOUNTER — Encounter: Payer: Self-pay | Admitting: Gastroenterology

## 2015-10-22 ENCOUNTER — Encounter: Payer: Self-pay | Admitting: Gastroenterology

## 2015-12-12 ENCOUNTER — Ambulatory Visit (AMBULATORY_SURGERY_CENTER): Payer: Self-pay | Admitting: *Deleted

## 2015-12-12 VITALS — Ht 62.0 in | Wt 189.0 lb

## 2015-12-12 DIAGNOSIS — Z1211 Encounter for screening for malignant neoplasm of colon: Secondary | ICD-10-CM

## 2015-12-12 MED ORDER — NA SULFATE-K SULFATE-MG SULF 17.5-3.13-1.6 GM/177ML PO SOLN: ORAL | 0 refills | Status: DC

## 2015-12-12 NOTE — Progress Notes (Signed)
Patient denies any allergies to eggs or soy. Patient denies any problems with anesthesia/sedation. Patient denies any oxygen use at home and does not take any diet/weight loss medications.  

## 2015-12-26 ENCOUNTER — Encounter: Payer: Self-pay | Admitting: Gastroenterology

## 2015-12-26 ENCOUNTER — Ambulatory Visit (AMBULATORY_SURGERY_CENTER): Payer: Medicare Other | Admitting: Gastroenterology

## 2015-12-26 VITALS — BP 138/69 | HR 72 | Temp 97.3°F | Resp 17 | Ht 62.0 in | Wt 189.0 lb

## 2015-12-26 DIAGNOSIS — Z1211 Encounter for screening for malignant neoplasm of colon: Secondary | ICD-10-CM | POA: Diagnosis not present

## 2015-12-26 DIAGNOSIS — D122 Benign neoplasm of ascending colon: Secondary | ICD-10-CM

## 2015-12-26 DIAGNOSIS — I10 Essential (primary) hypertension: Secondary | ICD-10-CM | POA: Diagnosis not present

## 2015-12-26 DIAGNOSIS — E669 Obesity, unspecified: Secondary | ICD-10-CM | POA: Diagnosis not present

## 2015-12-26 DIAGNOSIS — Z86010 Personal history of colonic polyps: Secondary | ICD-10-CM | POA: Diagnosis not present

## 2015-12-26 MED ORDER — SODIUM CHLORIDE 0.9 % IV SOLN: 500.0000 mL | INTRAVENOUS | Status: DC

## 2015-12-26 NOTE — Op Note (Signed)
[facility] Patient Name: Regina Chapman Procedure Date: 12/26/2015 8:25 AM MRN: [id number] Endoscopist: Mallie Mussel L. Loletha Carrow , MD Age: 65 Referring MD:  Date of Birth: Jun 13, 1950 Gender: Female Account #: [id number] Procedure:                Colonoscopy Indications:              Screening for colorectal malignant neoplasm Medicines:                Monitored Anesthesia Care Procedure:                Pre-Anesthesia Assessment:                           - Prior to the procedure, a History and Physical                            was performed, and patient medications and                            allergies were reviewed. The patient's tolerance of                            previous anesthesia was also reviewed. The risks                            and benefits of the procedure and the sedation                            options and risks were discussed with the patient.                            All questions were answered, and informed consent                            was obtained. Prior Anticoagulants: The patient has                            taken no previous anticoagulant or antiplatelet                            agents. ASA Grade Assessment: II - A patient with                            mild systemic disease. After reviewing the risks                            and benefits, the patient was deemed in                            satisfactory condition to undergo the procedure.                           After obtaining informed consent, the colonoscope  was passed under direct vision. Throughout the                            procedure, the patient's blood pressure, pulse, and                            oxygen saturations were monitored continuously. The                            Model CF-HQ190L [id number]) scope was introduced                            through the anus and advanced to the the cecum,                            identified by  appendiceal orifice and ileocecal                            valve. The colonoscopy was performed without                            difficulty. The patient tolerated the procedure                            well. The quality of the bowel preparation was                            good. The ileocecal valve, appendiceal orifice, and                            rectum were photographed. The quality of the bowel                            preparation was evaluated using the BBPS [facility]                            Bowel Preparation Scale) with scores of: Right                            Colon = 2, Transverse Colon = 2 and Left Colon = 2.                            The total BBPS score equals 6. The bowel                            preparation used was SUPREP. Scope In: 8:30:15 AM Scope Out: 8:43:57 AM Scope Withdrawal Time: 0 hours 10 minutes 46 seconds  Total Procedure Duration: 0 hours 13 minutes 42 seconds  Findings:                 The perianal and digital rectal examinations were                            normal.  Multiple medium-mouthed diverticula were found in                            the left colon.                           A 4 mm polyp was found in the proximal ascending                            colon. The polyp was sessile. The polyp was removed                            with a cold snare. Resection and retrieval were                            complete.                           The exam was otherwise without abnormality on                            direct and retroflexion views. Complications:            No immediate complications. Estimated Blood Loss:     Estimated blood loss: none. Impression:               - One 4 mm polyp in the proximal ascending colon,                            removed with a cold snare. Resected and retrieved.                           - The examination was otherwise normal on direct                            and  retroflexion views.                           - Diverticulosis in the left colon. Recommendation:           - Patient has a contact number available for                            emergencies. The signs and symptoms of potential                            delayed complications were discussed with the                            patient. Return to normal activities tomorrow.                            Written discharge instructions were provided to the                            patient.                           -  Resume previous diet.                           - Continue present medications.                           - Await pathology results.                           - Repeat colonoscopy is recommended for                            surveillance. The colonoscopy date will be                            determined after pathology results from today's                            exam become available for review. Rhylen Shaheen L. Loletha Carrow, MD 12/26/2015 8:47:59 AM This report has been signed electronically.

## 2015-12-26 NOTE — Patient Instructions (Signed)
YOU HAD AN ENDOSCOPIC PROCEDURE TODAY AT [facility]:   Refer to the procedure report that was given to you for any specific questions about what was found during the examination.  If the procedure report does not answer your questions, please call your gastroenterologist to clarify.  If you requested that your care partner not be given the details of your procedure findings, then the procedure report has been included in a sealed envelope for you to review at your convenience later.  YOU SHOULD EXPECT: Some feelings of bloating in the abdomen. Passage of more gas than usual.  Walking can help get rid of the air that was put into your GI tract during the procedure and reduce the bloating. If you had a lower endoscopy (such as a colonoscopy or flexible sigmoidoscopy) you may notice spotting of blood in your stool or on the toilet paper. If you underwent a bowel prep for your procedure, you may not have a normal bowel movement for a few days.  Please Note:  You might notice some irritation and congestion in your nose or some drainage.  This is from the oxygen used during your procedure.  There is no need for concern and it should clear up in a day or so.  SYMPTOMS TO REPORT IMMEDIATELY:   Following lower endoscopy (colonoscopy or flexible sigmoidoscopy):  Excessive amounts of blood in the stool  Significant tenderness or worsening of abdominal pains  Swelling of the abdomen that is new, acute  Fever of 100F or higher   For urgent or emergent issues, a gastroenterologist can be reached at any hour by calling [phone].   DIET:  We do recommend a small meal at first, but then you may proceed to your regular diet.  Drink plenty of fluids but you should avoid alcoholic beverages for 24 hours.  ACTIVITY:  You should plan to take it easy for the rest of today and you should NOT DRIVE or use heavy machinery until tomorrow (because of the sedation medicines used during the test).     FOLLOW UP: Our staff will call the number listed on your records the next business day following your procedure to check on you and address any questions or concerns that you may have regarding the information given to you following your procedure. If we do not reach you, we will leave a message.  However, if you are feeling well and you are not experiencing any problems, there is no need to return our call.  We will assume that you have returned to your regular daily activities without incident.  If any biopsies were taken you will be contacted by phone or by letter within the next 1-3 weeks.  Please call us at [phone] if you have not heard about the biopsies in 3 weeks.    SIGNATURES/CONFIDENTIALITY: You and/or your care partner have signed paperwork which will be entered into your electronic medical record.  These signatures attest to the fact that that the information above on your After Visit Summary has been reviewed and is understood.  Full responsibility of the confidentiality of this discharge information lies with you and/or your care-partner.  Polyp and Diverticulosis handouts provided. Continue present medications.

## 2015-12-26 NOTE — Progress Notes (Signed)
Called to room to assist during endoscopic procedure.  Patient ID and intended procedure confirmed with present staff. Received instructions for my participation in the procedure from the performing physician.  

## 2015-12-26 NOTE — Progress Notes (Signed)
Report given to PACU RN, vss 

## 2015-12-27 ENCOUNTER — Telehealth: Payer: Self-pay | Admitting: *Deleted

## 2015-12-27 NOTE — Telephone Encounter (Signed)
  Follow up Call-  Call back number 12/26/2015  Post procedure Call Back phone  # [phone]  Permission to leave phone message Yes  Some recent data might be hidden     Patient questions:  Message left to call us if necessary.

## 2015-12-27 NOTE — Telephone Encounter (Signed)
  Follow up Call-  Call back number 12/26/2015  Post procedure Call Back phone  # [phone]  Permission to leave phone message Yes  Some recent data might be hidden     Patient questions:  Left message to call us if necessary.

## 2016-01-02 ENCOUNTER — Encounter: Payer: Self-pay | Admitting: Gastroenterology

## 2016-01-16 ENCOUNTER — Encounter: Payer: Self-pay | Admitting: Family Medicine

## 2016-01-16 ENCOUNTER — Ambulatory Visit (INDEPENDENT_AMBULATORY_CARE_PROVIDER_SITE_OTHER): Payer: Medicare Other | Admitting: Family Medicine

## 2016-01-16 VITALS — BP 142/90 | HR 91 | Temp 98.9°F | Wt 187.0 lb

## 2016-01-16 DIAGNOSIS — M545 Low back pain: Secondary | ICD-10-CM

## 2016-01-16 DIAGNOSIS — R319 Hematuria, unspecified: Secondary | ICD-10-CM

## 2016-01-16 DIAGNOSIS — R300 Dysuria: Secondary | ICD-10-CM

## 2016-01-16 DIAGNOSIS — N3090 Cystitis, unspecified without hematuria: Secondary | ICD-10-CM

## 2016-01-16 LAB — POC URINALSYSI DIPSTICK (AUTOMATED)
Bilirubin, UA: NEGATIVE
Glucose, UA: NEGATIVE
Ketones, UA: NEGATIVE
Leukocytes, UA: NEGATIVE
Nitrite, UA: NEGATIVE
PH UA: 6
PROTEIN UA: NEGATIVE
SPEC GRAV UA: 1.025
UROBILINOGEN UA: NEGATIVE

## 2016-01-16 MED ORDER — PHENAZOPYRIDINE HCL 97.2 MG PO TABS: 97.0000 mg | ORAL_TABLET | Freq: Three times a day (TID) | ORAL | 0 refills | Status: DC | PRN

## 2016-01-16 MED ORDER — CEPHALEXIN 500 MG PO CAPS: 500.0000 mg | ORAL_CAPSULE | Freq: Three times a day (TID) | ORAL | 0 refills | Status: DC

## 2016-01-16 NOTE — Patient Instructions (Signed)

## 2016-01-16 NOTE — Progress Notes (Signed)
Subjective:    Patient ID: ARPI DIEBOLD, female    DOB: Feb 07, 1951, 65 y.o.   MRN: [id number]  HPI This is a pleasant 65 yo female who presents today with dysuria x 4 days. She has also noticed some low back and lower abdominal pain. She has a history of UTI and kidney stone. Last kidney stone about 2 years ago. This feels more like her typical cystitis. No fever/chills, no nausea/vomiting, no difficulty urinating. [place] stayed in a camper.   Past Medical History:  Diagnosis Date  . 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 Depression   . History of melanoma 1985  . Hyperlipidemia   . Hypertension   . Kidney stones   . Obesity   . Obesity   . Psoriasis    Past Surgical History:  Procedure Laterality Date  . CATARACT EXTRACTION W/ INTRAOCULAR LENS  IMPLANT, BILATERAL  2013  . COLOSTOMY  2007  . LAPAROSCOPIC ASSISTED VAGINAL HYSTERECTOMY  1987   FOR ENDOMETRIOSIS  . LUNG REMOVAL, PARTIAL    . MELANOMA EXCISION  1985   Family History  Problem Relation Age of Onset  . Cervical cancer Sister     CERVICAL   . Heart disease Mother   . Dementia Mother   . Breast cancer Other   . Diabetes Neg Hx   . Hypertension Neg Hx   . Colon cancer Neg Hx    Social History  Substance Use Topics  . Smoking status: Never Smoker  . Smokeless tobacco: Never Used  . Alcohol use 1.8 oz/week    3 Glasses of wine per week     Comment: 3 PER WEEK      Review of Systems Per HPI    Objective:   Physical Exam  Constitutional: She is oriented to person, place, and time. She appears well-developed and well-nourished. No distress.  HENT:  Head: Normocephalic and atraumatic.  Eyes: Conjunctivae are normal.  Cardiovascular: Normal rate, regular rhythm and normal heart sounds.   Pulmonary/Chest: Effort normal and breath sounds normal.  Abdominal: Soft. Bowel sounds are normal. There is tenderness in the suprapubic area. There is no CVA tenderness.    Neurological: She is alert and oriented to person, place, and time.  Skin: Skin is warm and dry. She is not diaphoretic.  Psychiatric: She has a normal mood and affect. Her behavior is normal. Judgment and thought content normal.  Vitals reviewed.     BP (!) 142/90   Pulse 91   Temp 98.9 F (37.2 C)   Wt 187 lb (84.8 kg)   SpO2 98%   BMI 34.20 kg/m  Wt Readings from Last 3 Encounters:  01/16/16 187 lb (84.8 kg)  12/26/15 189 lb (85.7 kg)  12/12/15 189 lb (85.7 kg)   Results for orders placed or performed in visit on 01/16/16  POCT Urinalysis Dipstick (Automated)  Result Value Ref Range   Color, UA yellow    Clarity, UA clear    Glucose, UA neg    Bilirubin, UA neg    Ketones, UA neg    Spec Grav, UA 1.025    Blood, UA 3+    pH, UA 6.0    Protein, UA neg    Urobilinogen, UA negative    Nitrite, UA neg    Leukocytes, UA Negative Negative       Assessment & Plan:  1. Low back pain, unspecified back pain laterality, with  sciatica presence unspecified - POCT Urinalysis Dipstick (Automated)  2. Cystitis - urine with blood only, will treat based on symptoms and check culture, could be atypical kidney stone presentation- RTC precautions reviewed, may need to go back to urology or have further testing - cephALEXin (KEFLEX) 500 MG capsule; Take 1 capsule (500 mg total) by mouth 3 (three) times daily.  Dispense: 21 capsule; Refill: 0 - phenazopyridine (PYRIDIUM) 97 MG tablet; Take 1 tablet (97 mg total) by mouth 3 (three) times daily as needed for pain.  Dispense: 10 tablet; Refill: 0 - Urine culture  3. Dysuria - Urine culture   Clarene Reamer, FNP-BC  [facility]  01/16/2016 2:28 PM

## 2016-01-17 LAB — URINE CULTURE: Organism ID, Bacteria: NO GROWTH

## 2016-01-18 NOTE — Addendum Note (Signed)
Addended by: Clarene Reamer B on: 01/18/2016 08:15 AM   Modules accepted: Orders

## 2016-02-12 DIAGNOSIS — N200 Calculus of kidney: Secondary | ICD-10-CM | POA: Diagnosis not present

## 2016-02-12 DIAGNOSIS — R8299 Other abnormal findings in urine: Secondary | ICD-10-CM | POA: Diagnosis not present

## 2016-02-12 DIAGNOSIS — R34 Anuria and oliguria: Secondary | ICD-10-CM | POA: Diagnosis not present

## 2016-02-14 ENCOUNTER — Encounter: Payer: Self-pay | Admitting: Family Medicine

## 2016-02-14 ENCOUNTER — Ambulatory Visit (INDEPENDENT_AMBULATORY_CARE_PROVIDER_SITE_OTHER): Payer: Medicare Other | Admitting: Family Medicine

## 2016-02-14 VITALS — BP 140/90 | HR 69 | Wt 184.0 lb

## 2016-02-14 DIAGNOSIS — M25551 Pain in right hip: Secondary | ICD-10-CM | POA: Diagnosis not present

## 2016-02-14 MED ORDER — MELOXICAM 15 MG PO TABS: ORAL_TABLET | ORAL | 0 refills | Status: DC

## 2016-02-14 MED ORDER — CYCLOBENZAPRINE HCL 10 MG PO TABS: 10.0000 mg | ORAL_TABLET | Freq: Every evening | ORAL | 0 refills | Status: DC | PRN

## 2016-02-14 NOTE — Patient Instructions (Signed)
Please walk a little bit every day, try the exercises below If not better in 2 weeks lets set you up with either Dr. Lorelei Pont (sports medicine) or an orthopedic doctor  Generic Hip Exercises RANGE OF MOTION (ROM) AND STRETCHING EXERCISES  These exercises may help you when beginning to rehabilitate your injury. Doing them too aggressively can worsen your condition. Complete them slowly and gently. Your symptoms may resolve with or without further involvement from your physician, physical therapist or athletic trainer. While completing these exercises, remember:   Restoring tissue flexibility helps normal motion to return to the joints. This allows healthier, less painful movement and activity.  An effective stretch should be held for at least 30 seconds.  A stretch should never be painful. You should only feel a gentle lengthening or release in the stretched tissue. If these stretches worsen your symptoms even when done gently, consult your physician, physical therapist or athletic trainer. STRETCH - Hamstrings, Supine   Lie on your back. Loop a belt or towel over the ball of your right / left foot.  Straighten your right / left knee and slowly pull on the belt to raise your leg. Do not allow the right / left knee to bend. Keep your opposite leg flat on the floor.  Raise the leg until you feel a gentle stretch behind your right / left knee or thigh. Hold this position for __________ seconds. Repeat __________ times. Complete this stretch __________ times per day.  STRETCH - Hip Rotators   Lie on your back on a firm surface. Grasp your right / left knee with your right / left hand and your ankle with your opposite hand.  Keeping your hips and shoulders firmly planted, gently pull your right / left knee and rotate your lower leg toward your opposite shoulder until you feel a stretch in your buttocks.  Hold this stretch for __________ seconds. Repeat this stretch __________ times. Complete this  stretch __________ times per day. STRETCH - Hamstrings/Adductors, V-Sit   Sit on the floor with your legs extended in a large "V," keeping your knees straight.  With your head and chest upright, bend at your waist reaching for your right foot to stretch your left adductors.  You should feel a stretch in your left inner thigh. Hold for __________ seconds.  Return to the upright position to relax your leg muscles.  Continuing to keep your chest upright, bend straight forward at your waist to stretch your hamstrings.  You should feel a stretch behind both of your thighs and/or knees. Hold for __________ seconds.  Return to the upright position to relax your leg muscles.  Repeat steps 2 through 4 for opposite leg. Repeat __________ times. Complete this exercise __________ times per day.  STRETCHING - Hip Flexors, Lunge  Half kneel with your right / left knee on the floor and your opposite knee bent and directly over your ankle.  Keep good posture with your head over your shoulders. Tighten your buttocks to point your tailbone downward; this will prevent your back from arching too much.  You should feel a gentle stretch in the front of your thigh and/or hip. If you do not feel any resistance, slightly slide your opposite foot forward and then slowly lunge forward so your knee once again lines up over your ankle. Be sure your tailbone remains pointed downward.  Hold this stretch for __________ seconds. Repeat __________ times. Complete this stretch __________ times per day. STRENGTHENING EXERCISES These exercises may help  you when beginning to rehabilitate your injury. They may resolve your symptoms with or without further involvement from your physician, physical therapist or athletic trainer. While completing these exercises, remember:   Muscles can gain both the endurance and the strength needed for everyday activities through controlled exercises.  Complete these exercises as  instructed by your physician, physical therapist or athletic trainer. Progress the resistance and repetitions only as guided.  You may experience muscle soreness or fatigue, but the pain or discomfort you are trying to eliminate should never worsen during these exercises. If this pain does worsen, stop and make certain you are following the directions exactly. If the pain is still present after adjustments, discontinue the exercise until you can discuss the trouble with your clinician. STRENGTH - Hip Extensors, Bridge   Lie on your back on a firm surface. Bend your knees and place your feet flat on the floor.  Tighten your buttocks muscles and lift your bottom off the floor until your trunk is level with your thighs. You should feel the muscles in your buttocks and back of your thighs working. If you do not feel these muscles, slide your feet 1-2 inches further away from your buttocks.  Hold this position for __________ seconds.  Slowly lower your hips to the starting position and allow your buttock muscles relax completely before beginning the next repetition.  If this exercise is too easy, you may cross your arms over your chest. Repeat __________ times. Complete this exercise __________ times per day.  STRENGTH - Hip Abductors, Straight Leg Raises  Be aware of your form throughout the entire exercise so that you exercise the correct muscles. Sloppy form means that you are not strengthening the correct muscles.  Lie on your side so that your head, shoulders, knee and hip line up. You may bend your lower knee to help maintain your balance. Your right / left leg should be on top.  Roll your hips slightly forward, so that your hips are stacked directly over each other and your right / left knee is facing forward.  Lift your top leg up 4-6 inches, leading with your heel. Be sure that your foot does not drift forward or that your knee does not roll toward the ceiling.  Hold this position for  __________ seconds. You should feel the muscles in your outer hip lifting (you may not notice this until your leg begins to tire).  Slowly lower your leg to the starting position. Allow the muscles to fully relax before beginning the next repetition. Repeat __________ times. Complete this exercise __________ times per day.  STRENGTH - Hip Adductors, Straight Leg Raises   Lie on your side so that your head, shoulders, knee and hip line up. You may place your upper foot in front to help maintain your balance. Your right / left leg should be on the bottom.  Roll your hips slightly forward, so that your hips are stacked directly over each other and your right / left knee is facing forward.  Tense the muscles in your inner thigh and lift your bottom leg 4-6 inches. Hold this position for __________ seconds.  Slowly lower your leg to the starting position. Allow the muscles to fully relax before beginning the next repetition. Repeat __________ times. Complete this exercise __________ times per day.  STRENGTH - Quadriceps, Straight Leg Raises  Quality counts! Watch for signs that the quadriceps muscle is working to insure you are strengthening the correct muscles and not "cheating" by  substituting with healthier muscles.  Lay on your back with your right / left leg extended and your opposite knee bent.  Tense the muscles in the front of your right / left thigh. You should see either your knee cap slide up or increased dimpling just above the knee. Your thigh may even quiver.  Tighten these muscles even more and raise your leg 4 to 6 inches off the floor. Hold for right / left seconds.  Keeping these muscles tense, lower your leg.  Relax the muscles slowly and completely in between each repetition. Repeat __________ times. Complete this exercise __________ times per day.  STRENGTH - Hip Abductors, Standing  Tie one end of a rubber exercise band/tubing to a secure surface (table, pole) and tie a  loop at the other end.  Place the loop around your right / left ankle. Keeping your ankle with the band directly opposite of the secured end, step away until there is tension in the tube/band.  Hold onto a chair as needed for balance.  Keeping your back upright, your shoulders over your hips, and your toes pointing forward, lift your right / left leg out to your side. Be sure to lift your leg with your hip muscles. Do not "throw" your leg or tip your body to lift your leg.  Slowly and with control, return to the starting position. Repeat exercise __________ times. Complete this exercise __________ times per day.  STRENGTH - Quadriceps, Squats  Stand in a door frame so that your feet and knees are in line with the frame.  Use your hands for balance, not support, on the frame.  Slowly lower your weight, bending at the hips and knees. Keep your lower legs upright so that they are parallel with the door frame. Squat only within the range that does not increase your knee pain. Never let your hips drop below your knees.  Slowly return upright, pushing with your legs, not pulling with your hands.   This information is not intended to replace advice given to you by your health care provider. Make sure you discuss any questions you have with your health care provider.   Document Released: 05/02/2005 Document Revised: 05/05/2014 Document Reviewed: 07/27/2008 Elsevier Interactive Patient Education 2016 [employer] Hip Pain Your hip is the joint between your upper legs and your lower pelvis. The bones, cartilage, tendons, and muscles of your hip joint perform a lot of work each day supporting your body weight and allowing you to move around. Hip pain can range from a minor ache to severe pain in one or both of your hips. Pain may be felt on the inside of the hip joint near the groin, or the outside near the buttocks and upper thigh. You may have swelling or stiffness as well.  HOME CARE  INSTRUCTIONS   Take medicines only as directed by your health care provider.  Apply ice to the injured area:  Put ice in a plastic bag.  Place a towel between your skin and the bag.  Leave the ice on for 15-20 minutes at a time, 3-4 times a day.  Keep your leg raised (elevated) when possible to lessen swelling.  Avoid activities that cause pain.  Follow specific exercises as directed by your health care provider.  Sleep with a pillow between your legs on your most comfortable side.  Record how often you have hip pain, the location of the pain, and what it feels like. SEEK MEDICAL CARE IF:   You  are unable to put weight on your leg.  Your hip is red or swollen or very tender to touch.  Your pain or swelling continues or worsens after 1 week.  You have increasing difficulty walking.  You have a fever. SEEK IMMEDIATE MEDICAL CARE IF:   You have fallen.  You have a sudden increase in pain and swelling in your hip. MAKE SURE YOU:   Understand these instructions.  Will watch your condition.  Will get help right away if you are not doing well or get worse.   This information is not intended to replace advice given to you by your health care provider. Make sure you discuss any questions you have with your health care provider.   Document Released: 10/02/2009 Document Revised: 05/05/2014 Document Reviewed: 12/09/2012 Elsevier Interactive Patient Education [employer].

## 2016-02-14 NOTE — Progress Notes (Signed)
Subjective:    Patient ID: Regina Chapman, female    DOB: [birth year], 65 y.o.   MRN: [id number]  HPI This is a 65 yo female who presents today with right sided back pain radiating to her leg for two weeks. Pain is primarily from right buttock to front of thigh. No known trauma or fall. Has had similar pain in the past but has been many years ago. Has tried heat/ice, topical medicines, Alleve, Tylenol. Some short term relief. Can't lay on right side at night, very stiff in the morning. Pain varies from day to day. Hurts more with long sitting. No weakness or numbness. Takes Alleve 440 mg every night for musculoskeletal pain and stiffness for a long time. Walks a little for exercise, improved symptoms with increased activity.   Past Medical History:  Diagnosis Date  . 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 Depression   . History of melanoma 1985  . Hyperlipidemia   . Hypertension   . Kidney stones   . Obesity   . Obesity   . Psoriasis    Past Surgical History:  Procedure Laterality Date  . CATARACT EXTRACTION W/ INTRAOCULAR LENS  IMPLANT, BILATERAL  2013  . COLOSTOMY  2007  . LAPAROSCOPIC ASSISTED VAGINAL HYSTERECTOMY  1987   FOR ENDOMETRIOSIS  . LUNG REMOVAL, PARTIAL    . MELANOMA EXCISION  1985   Family History  Problem Relation Age of Onset  . Cervical cancer Sister     CERVICAL   . Heart disease Mother   . Dementia Mother   . Breast cancer Other   . Diabetes Neg Hx   . Hypertension Neg Hx   . Colon cancer Neg Hx    Social History  Substance Use Topics  . Smoking status: Never Smoker  . Smokeless tobacco: Never Used  . Alcohol use 1.8 oz/week    3 Glasses of wine per week     Comment: 3 PER WEEK      Review of Systems Per HPI    Objective:   Physical Exam  Constitutional: She is oriented to person, place, and time. She appears well-developed and well-nourished. No distress.  Obese.   HENT:  Head: Normocephalic and atraumatic.  Eyes: Conjunctivae are  normal.  Cardiovascular: Normal rate.   Pulmonary/Chest: Effort normal.  Musculoskeletal: Normal range of motion. She exhibits no edema.       Right hip: She exhibits tenderness (buttock) and bony tenderness. She exhibits normal range of motion, normal strength, no swelling and no crepitus.       Lumbar back: She exhibits tenderness (paraspinal muscles). She exhibits no bony tenderness and no swelling.  Neurological: She is alert and oriented to person, place, and time. She has normal reflexes.  Skin: Skin is warm and dry. She is not diaphoretic.  Psychiatric: She has a normal mood and affect. Her behavior is normal. Judgment and thought content normal.  Vitals reviewed.     BP 140/90   Pulse 69   Wt 184 lb (83.5 kg)   SpO2 96%   BMI 33.65 kg/m  Wt Readings from Last 3 Encounters:  02/14/16 184 lb (83.5 kg)  01/16/16 187 lb (84.8 kg)  12/26/15 189 lb (85.7 kg)       Assessment & Plan:  1. Right hip pain - Provided written and verbal information regarding diagnosis and treatment. - suspect inflammation, will treat conservatively with NSAIDs, muscle relaxer at night for sleep, heat/ice, stretching exercises - if  no improvement with above, consider appointment with Dr. Lorelei Pont (sports medicine) or ortho referral - meloxicam (MOBIC) 15 MG tablet; Daily for 7 to 10 days then then daily as needed  Dispense: 30 tablet; Refill: 0 - cyclobenzaprine (FLEXERIL) 10 MG tablet; Take 1 tablet (10 mg total) by mouth at bedtime as needed for muscle spasms. Can take 1/2 tablet if 1 tablet to strong.  Dispense: 30 tablet; Refill: 0 - discussed potential side effects of NSAIDs and encouraged her to use lowest amount for shortest possible length and transition to acetaminophen when able.   Clarene Reamer, FNP-BC  [facility]  02/14/2016 12:33 PM

## 2016-02-26 DIAGNOSIS — N200 Calculus of kidney: Secondary | ICD-10-CM | POA: Diagnosis not present

## 2016-02-26 DIAGNOSIS — N202 Calculus of kidney with calculus of ureter: Secondary | ICD-10-CM | POA: Diagnosis not present

## 2016-03-12 ENCOUNTER — Other Ambulatory Visit: Payer: Self-pay | Admitting: Family Medicine

## 2016-03-12 DIAGNOSIS — M25551 Pain in right hip: Secondary | ICD-10-CM

## 2016-03-31 DIAGNOSIS — N201 Calculus of ureter: Secondary | ICD-10-CM | POA: Diagnosis not present

## 2016-03-31 DIAGNOSIS — N200 Calculus of kidney: Secondary | ICD-10-CM | POA: Diagnosis not present

## 2016-04-29 DIAGNOSIS — Z23 Encounter for immunization: Secondary | ICD-10-CM | POA: Diagnosis not present

## 2016-05-28 ENCOUNTER — Encounter: Payer: Self-pay | Admitting: Family Medicine

## 2016-05-28 ENCOUNTER — Ambulatory Visit (INDEPENDENT_AMBULATORY_CARE_PROVIDER_SITE_OTHER): Payer: Medicare Other | Admitting: Family Medicine

## 2016-05-28 VITALS — BP 146/94 | HR 80 | Temp 98.6°F | Ht 62.0 in | Wt 186.0 lb

## 2016-05-28 DIAGNOSIS — S46011A Strain of muscle(s) and tendon(s) of the rotator cuff of right shoulder, initial encounter: Secondary | ICD-10-CM

## 2016-05-28 MED ORDER — DICLOFENAC SODIUM 75 MG PO TBEC: 75.0000 mg | DELAYED_RELEASE_TABLET | Freq: Two times a day (BID) | ORAL | 1 refills | Status: DC

## 2016-05-28 NOTE — Progress Notes (Signed)
Dr. Frederico Hamman T. Ezequias Lard, MD, [facility] [place], [address] Phone: [phone] Fax: [phone]  05/28/2016  Patient: Regina Chapman, MRN: [id number], DOB: 03/03/1951, 66 y.o.  Primary Physician:  Viviana Simpler, MD   Chief Complaint  Patient presents with  . Shoulder Pain    Hanging wreath at Christmas and heard pop in shoulder   Subjective:   This 66 y.o. female patient noted above presents with shoulder pain that has been ongoing for 1 mo She had pain hanging christmas decorations. The patient denies neck pain or radicular symptoms. Denies dislocation, subluxation, separation of the shoulder. The patient does complain of pain in the overhead plane with significant painful arc of motion.  Severe pain for R shoulder, has started to get better. Then a few weeks later started again and it has gotten better now and it is still aggravated.   Trouble sleeping. No real numbness and tingling.   Medications Tried: Tylenol, NSAIDS Ice or Heat: minimally helpful Tried PT: No  Prior shoulder Injury: No Prior surgery: No Prior fracture: No  The PMH, PSH, Social History, Family History, Medications, and allergies have been reviewed in [facility], and have been updated if relevant.  Patient Active Problem List   Diagnosis Date Noted  . Injection site reaction 07/25/2015  . Postmenopausal estrogen deficiency 07/20/2015  . Advance directive discussed with patient 07/20/2015  . 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 Acute infection of nasal sinus 04/04/2015  . Obesity   . Routine general medical examination at a health care facility 02/09/2013  . Hyperlipidemia   . Hypertension   . Recurrent major depression in remission ([place]) 09/16/2008    Past Medical History:  Diagnosis Date  . 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 Depression   . History of melanoma 1985  . Hyperlipidemia   . Hypertension   . Kidney stones   .  Obesity   . Obesity   . Psoriasis     Past Surgical History:  Procedure Laterality Date  . CATARACT EXTRACTION W/ INTRAOCULAR LENS  IMPLANT, BILATERAL  2013  . COLOSTOMY  2007  . LAPAROSCOPIC ASSISTED VAGINAL HYSTERECTOMY  1987   FOR ENDOMETRIOSIS  . LUNG REMOVAL, PARTIAL    . MELANOMA EXCISION  1985    Social History   Social History  . Marital status: Widowed    Spouse name: N/A  . Number of children: 1  . Years of education: N/A   Occupational History  . Accounting and customer service     Retired 2013   Social History Main Topics  . Smoking status: Never Smoker  . Smokeless tobacco: Never Used  . Alcohol use 1.8 oz/week    3 Glasses of wine per week     Comment: 3 PER WEEK  . Drug use: No  . Sexual activity: No   Other Topics Concern  . Not on file   Social History Narrative   No living will or health care POA   Would want son to make decisions   Would accept resuscitation but no prolonged ventilation   No tube feeds if cognitively unaware    Family History  Problem Relation Age of Onset  . Cervical cancer Sister     CERVICAL   . Heart disease Mother   . Dementia Mother   . Breast cancer Other   . Diabetes Neg Hx   . Hypertension Neg Hx   .  Colon cancer Neg Hx     Allergies  Allergen Reactions  . Augmentin [Amoxicillin-Pot Clavulanate] Nausea And Vomiting    Able to tolerate Amoxil    Medication list reviewed and updated in full in [place].  GEN: No fevers, chills. Nontoxic. Primarily MSK c/o today. MSK: Detailed in the HPI GI: tolerating PO intake without difficulty Neuro: No numbness, parasthesias, or tingling associated. Otherwise the pertinent positives of the ROS are noted above.   Objective:   Blood pressure (!) 146/94, pulse 80, temperature 98.6 F (37 C), temperature source Oral, height '5\' 2"'$  (1.575 m), weight 186 lb (84.4 kg).  GEN: Well-developed,well-nourished,in no acute distress; alert,appropriate and cooperative  throughout examination HEENT: Normocephalic and atraumatic without obvious abnormalities. Ears, externally no deformities PULM: Breathing comfortably in no respiratory distress EXT: No clubbing, cyanosis, or edema PSYCH: Normally interactive. Cooperative during the interview. Pleasant. Friendly and conversant. Not anxious or depressed appearing. Normal, full affect.  Shoulder: R Inspection: No muscle wasting or winging Ecchymosis/edema: neg  AC joint, scapula, clavicle: NT Cervical spine: NT, full ROM Spurling's: neg Abduction: full, 5/5 Flexion: full, 5/5 IR, full, lift-off: 5/5 ER at neutral: full, 5/5 AC crossover: neg Neer: mild pos Hawkins: neg Drop Test: neg Empty Can: neg Supraspinatus insertion: mild-mod T Bicipital groove: NT Speed's: neg Yergason's: neg Sulcus sign: neg Scapular dyskinesis: none C5-T1 intact  Neuro: Sensation intact Grip 5/5   Radiology: No results found.  Assessment and Plan:    Rotator cuff strain, right, initial encounter  Likely small rotator cuff partial-thickness tear or tear of scar tissue, but her strength is preserved at this point very well. She is recovering already.  A rehabilitation program from the [employer] was reviewed with the patient face to face for their condition.    Follow-up: No Follow-up on file.  New Prescriptions   DICLOFENAC (VOLTAREN) 75 MG EC TABLET    Take 1 tablet (75 mg total) by mouth 2 (two) times daily.   Signed,  Maud Deed. Ettore Trebilcock, MD   Patient's Medications  New Prescriptions   DICLOFENAC (VOLTAREN) 75 MG EC TABLET    Take 1 tablet (75 mg total) by mouth 2 (two) times daily.  Previous Medications   ASPIRIN 81 MG TABLET    Take 81 mg by mouth 2 (two) times a week.    BIOTIN 81017 MCG TABS    Take 1 tablet by mouth daily.   BUPROPION (WELLBUTRIN XL) 150 MG 24 HR TABLET    Take 1 tablet (150 mg total) by mouth 2 (two) times daily.   CHOLECALCIFEROL (VITAMIN D) 1000  UNITS TABLET    Take 1,000 Units by mouth daily.   ESTRADIOL (ESTRACE) 0.5 MG TABLET    Take 1 tablet (0.5 mg total) by mouth daily.   HYDROCHLOROTHIAZIDE (HYDRODIURIL) 25 MG TABLET    Take 1 tablet (25 mg total) by mouth daily.   OMEGA 3 1000 MG CAPS    Take 2 capsules by mouth daily.   POTASSIUM CITRATE (UROCIT-K) 10 MEQ (1080 MG) SR TABLET    Take by mouth.   TRAZODONE (DESYREL) 50 MG TABLET    Take 1-2 tablets (50-100 mg total) by mouth at bedtime as needed.  Modified Medications   No medications on file  Discontinued Medications   CYCLOBENZAPRINE (FLEXERIL) 10 MG TABLET    Take 1 tablet (10 mg total) by mouth at bedtime as needed for muscle spasms. Can take 1/2 tablet if 1 tablet to strong.  MELOXICAM (MOBIC) 15 MG TABLET    Daily for 7 to 10 days then then daily as needed

## 2016-05-28 NOTE — Progress Notes (Signed)
Pre visit review using our clinic review tool, if applicable. No additional management support is needed unless otherwise documented below in the visit note. 

## 2016-06-05 ENCOUNTER — Other Ambulatory Visit: Payer: Self-pay | Admitting: Internal Medicine

## 2016-07-12 DIAGNOSIS — H6591 Unspecified nonsuppurative otitis media, right ear: Secondary | ICD-10-CM | POA: Diagnosis not present

## 2016-07-12 DIAGNOSIS — J019 Acute sinusitis, unspecified: Secondary | ICD-10-CM | POA: Diagnosis not present

## 2016-07-23 ENCOUNTER — Other Ambulatory Visit: Payer: Self-pay | Admitting: Internal Medicine

## 2016-07-23 ENCOUNTER — Other Ambulatory Visit (HOSPITAL_COMMUNITY): Payer: Self-pay | Admitting: Obstetrics & Gynecology

## 2016-07-23 DIAGNOSIS — Z1231 Encounter for screening mammogram for malignant neoplasm of breast: Secondary | ICD-10-CM

## 2016-07-25 ENCOUNTER — Other Ambulatory Visit: Payer: Self-pay | Admitting: Internal Medicine

## 2016-08-18 ENCOUNTER — Ambulatory Visit
Admission: RE | Admit: 2016-08-18 | Discharge: 2016-08-18 | Disposition: A | Discharge status: Home / Self Care | Payer: Medicare Other | Source: Ambulatory Visit | Attending: Internal Medicine | Admitting: Internal Medicine

## 2016-08-18 DIAGNOSIS — Z1231 Encounter for screening mammogram for malignant neoplasm of breast: Secondary | ICD-10-CM | POA: Insufficient documentation

## 2016-08-18 HISTORY — DX: Malignant (primary) neoplasm, unspecified: C80.1

## 2016-08-19 ENCOUNTER — Encounter: Payer: Medicare Other | Admitting: Internal Medicine

## 2016-08-22 ENCOUNTER — Other Ambulatory Visit: Payer: Self-pay | Admitting: Internal Medicine

## 2016-09-08 ENCOUNTER — Other Ambulatory Visit: Payer: Self-pay | Admitting: Internal Medicine

## 2016-09-26 ENCOUNTER — Ambulatory Visit (INDEPENDENT_AMBULATORY_CARE_PROVIDER_SITE_OTHER): Payer: Medicare Other | Admitting: Internal Medicine

## 2016-09-26 ENCOUNTER — Encounter: Payer: Self-pay | Admitting: Internal Medicine

## 2016-09-26 VITALS — BP 140/88 | HR 78 | Temp 98.4°F | Ht 62.0 in | Wt 180.0 lb

## 2016-09-26 DIAGNOSIS — Z23 Encounter for immunization: Secondary | ICD-10-CM

## 2016-09-26 DIAGNOSIS — F3340 Major depressive disorder, recurrent, in remission, unspecified: Secondary | ICD-10-CM | POA: Diagnosis not present

## 2016-09-26 DIAGNOSIS — I10 Essential (primary) hypertension: Secondary | ICD-10-CM

## 2016-09-26 DIAGNOSIS — Z0000 Encounter for general adult medical examination without abnormal findings: Secondary | ICD-10-CM

## 2016-09-26 DIAGNOSIS — D3A090 Benign carcinoid tumor of the bronchus and lung: Secondary | ICD-10-CM

## 2016-09-26 DIAGNOSIS — Z0001 Encounter for general adult medical examination with abnormal findings: Secondary | ICD-10-CM

## 2016-09-26 DIAGNOSIS — G629 Polyneuropathy, unspecified: Secondary | ICD-10-CM

## 2016-09-26 DIAGNOSIS — E8940 Asymptomatic postprocedural ovarian failure: Secondary | ICD-10-CM

## 2016-09-26 DIAGNOSIS — Z780 Asymptomatic menopausal state: Secondary | ICD-10-CM | POA: Diagnosis not present

## 2016-09-26 DIAGNOSIS — R200 Anesthesia of skin: Secondary | ICD-10-CM | POA: Diagnosis not present

## 2016-09-26 DIAGNOSIS — Z79890 Hormone replacement therapy (postmenopausal): Secondary | ICD-10-CM

## 2016-09-26 LAB — COMPREHENSIVE METABOLIC PANEL
ALT: 19 U/L (ref 0–35)
AST: 21 U/L (ref 0–37)
Albumin: 4.4 g/dL (ref 3.5–5.2)
Alkaline Phosphatase: 53 U/L (ref 39–117)
BILIRUBIN TOTAL: 0.5 mg/dL (ref 0.2–1.2)
BUN: 19 mg/dL (ref 6–23)
CALCIUM: 10.2 mg/dL (ref 8.4–10.5)
CHLORIDE: 103 meq/L (ref 96–112)
CO2: 30 meq/L (ref 19–32)
Creatinine, Ser: 0.89 mg/dL (ref 0.40–1.20)
GFR: 67.32 mL/min (ref >60.00)
GLUCOSE: 91 mg/dL (ref 70–99)
POTASSIUM: 4.2 meq/L (ref 3.5–5.1)
Sodium: 140 mEq/L (ref 135–145)
Total Protein: 7.8 g/dL (ref 6.0–8.3)

## 2016-09-26 LAB — VITAMIN B12: VITAMIN B 12: 285 pg/mL (ref 211–911)

## 2016-09-26 LAB — CBC WITH DIFFERENTIAL/PLATELET
BASOS ABS: 0.1 10*3/uL (ref 0.0–0.1)
Basophils Relative: 1.1 % (ref 0.0–3.0)
Eosinophils Absolute: 0.2 10*3/uL (ref 0.0–0.7)
Eosinophils Relative: 2.5 % (ref 0.0–5.0)
HEMATOCRIT: 45.8 % (ref 36.0–46.0)
Hemoglobin: 15.3 g/dL — ABNORMAL HIGH (ref 12.0–15.0)
LYMPHS ABS: 2.1 10*3/uL (ref 0.7–4.0)
LYMPHS PCT: 27.6 % (ref 12.0–46.0)
MCHC: 33.4 g/dL (ref 30.0–36.0)
MCV: 91.2 fl (ref 78.0–100.0)
MONOS PCT: 9.8 % (ref 3.0–12.0)
Monocytes Absolute: 0.8 10*3/uL (ref 0.1–1.0)
NEUTROS ABS: 4.5 10*3/uL (ref 1.4–7.7)
NEUTROS PCT: 59 % (ref 43.0–77.0)
PLATELETS: 259 10*3/uL (ref 150.0–400.0)
RBC: 5.02 Mil/uL (ref 3.87–5.11)
RDW: 14 % (ref 11.5–15.5)
WBC: 7.7 10*3/uL (ref 4.0–10.5)

## 2016-09-26 LAB — T4, FREE: Free T4: 1.54 ng/dL (ref 0.60–1.60)

## 2016-09-26 MED ORDER — BUPROPION HCL ER (XL) 150 MG PO TB24: ORAL_TABLET | ORAL | 3 refills | Status: DC

## 2016-09-26 MED ORDER — TRAZODONE HCL 50 MG PO TABS: ORAL_TABLET | ORAL | 3 refills | Status: DC

## 2016-09-26 MED ORDER — ESTRADIOL 0.5 MG PO TABS: 0.2500 mg | ORAL_TABLET | Freq: Every day | ORAL | 3 refills | Status: DC

## 2016-09-26 MED ORDER — GABAPENTIN 300 MG PO CAPS: 300.0000 mg | ORAL_CAPSULE | Freq: Three times a day (TID) | ORAL | 11 refills | Status: DC

## 2016-09-26 MED ORDER — HYDROCHLOROTHIAZIDE 25 MG PO TABS: ORAL_TABLET | ORAL | 3 refills | Status: DC

## 2016-09-26 NOTE — Addendum Note (Signed)
Addended by: Pilar Grammes on: 09/26/2016 10:28 AM   Modules accepted: Orders

## 2016-09-26 NOTE — Progress Notes (Signed)
Subjective:    Patient ID: Regina Chapman, female    DOB: [birth year], 66 y.o.   MRN: [id number]  HPI Here for Medicare wellness and follow up of chronic health conditions Reviewed form and advanced directives Reviewed other doctors-- Cr Sherol Dade at [facility] for nephrolithiasis, Dr Lenox Ponds, Dr Ellin Mayhew (opto), Dr Karna Christmas-- oncology, [facility] (derm) Alcohol once in a while No tobacco Tries to exercise some No falls Vision and hearing are okay. Independent with instrumental ADLs No sig memory problems  She notes increasing "nerve pain"---arms, hip and hands Sensitive to touch Some swelling in hands Off and on for a long time Interested in trying gabapentin--used one of friend's and it helped Also sciatic nerve pain in left leg --better now. No leg weakness  Reviewed carcinoid in lung Has ongoing nodules in lung Continues to follow at [facility]  Chronic MDD Mood has been okay Continues on bupropion and trazodone for sleep  Continues on diuretic Controls edema No chest pain No SOB No palpitations  Still on daily estrogen Has cut down to just 1/2 of the current dose Discussed trying to skip day  Current Outpatient Prescriptions on File Prior to Visit  Medication Sig Dispense Refill  . aspirin 81 MG tablet Take 81 mg by mouth 2 (two) times a week.     . Biotin 10000 MCG TABS Take 1 tablet by mouth daily.    Marland Kitchen buPROPion (WELLBUTRIN XL) 150 MG 24 hr tablet TAKE 1 TABLET(150 MG) BY MOUTH TWICE DAILY 90 tablet 0  . cholecalciferol (VITAMIN D) 1000 units tablet Take 1,000 Units by mouth daily.    Marland Kitchen estradiol (ESTRACE) 0.5 MG tablet Take 1 tablet (0.5 mg total) by mouth daily. 90 tablet 6  . hydrochlorothiazide (HYDRODIURIL) 25 MG tablet TAKE 1 TABLET(25 MG) BY MOUTH DAILY 90 tablet 0  . Omega 3 1000 MG CAPS Take 2 capsules by mouth daily.    . potassium citrate (UROCIT-K) 10 MEQ (1080 MG) SR tablet Take by mouth.    . traZODone (DESYREL) 50 MG tablet TAKE 1 TO 2  TABLETS(50 TO 100 MG) BY MOUTH AT BEDTIME AS NEEDED 180 tablet 0   Current Facility-Administered Medications on File Prior to Visit  Medication Dose Route Frequency Provider Last Rate Last Dose  . 0.9 %  sodium chloride infusion  500 mL Intravenous Continuous Doran Stabler, MD        Allergies  Allergen Reactions  . Augmentin [Amoxicillin-Pot Clavulanate] Nausea And Vomiting    Able to tolerate Amoxil    Past Medical History:  Diagnosis Date  . Cancer ([place])    Melanoma  . 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 Depression   . History of melanoma 1985  . Hyperlipidemia   . Hypertension   . Kidney stones   . Obesity   . Obesity   . Psoriasis     Past Surgical History:  Procedure Laterality Date  . CATARACT EXTRACTION W/ INTRAOCULAR LENS  IMPLANT, BILATERAL  2013  . COLOSTOMY  2007  . LAPAROSCOPIC ASSISTED VAGINAL HYSTERECTOMY  1987   FOR ENDOMETRIOSIS  . LUNG REMOVAL, PARTIAL    . MELANOMA EXCISION  1985    Family History  Problem Relation Age of Onset  . Cervical cancer Sister        CERVICAL   . Heart disease Mother   . Dementia Mother   . Breast cancer Other   . Diabetes Neg Hx   . Hypertension Neg Hx   .  Colon cancer Neg Hx     Social History   Social History  . Marital status: Widowed    Spouse name: N/A  . Number of children: 1  . Years of education: N/A   Occupational History  . Accounting and customer service     Retired 2013   Social History Main Topics  . Smoking status: Never Smoker  . Smokeless tobacco: Never Used  . Alcohol use 1.8 oz/week    3 Glasses of wine per week     Comment: 3 PER WEEK  . Drug use: No  . Sexual activity: No   Other Topics Concern  . Not on file   Social History Narrative   No living will or health care POA   Would want son to make decisions   Would accept resuscitation but no prolonged ventilation   No tube feeds if cognitively unaware   Review of Systems Appetite is good Has lost a few  pounds Sleeps okay Wears seat belt Teeth are okay--regular with dentist Bowels are fine. No blood Mild joint aching--no predominant problems Chronic psoriasis--uses topicals.  No heartburn or dysphagia    Objective:   Physical Exam  Constitutional: She is oriented to person, place, and time. She appears well-nourished. No distress.  HENT:  Mouth/Throat: Oropharynx is clear and moist. No oropharyngeal exudate.  Neck: No thyromegaly present.  Cardiovascular: Normal rate, regular rhythm, normal heart sounds and intact distal pulses.  Exam reveals no gallop.   No murmur heard. Pulmonary/Chest: Effort normal and breath sounds normal. No respiratory distress. She has no wheezes. She has no rales.  Abdominal: Soft. There is no tenderness.  Musculoskeletal: She exhibits no edema or tenderness.  Lymphadenopathy:    She has no cervical adenopathy.  Neurological: She is alert and oriented to person, place, and time. She exhibits normal muscle tone. Coordination normal.  President-- "Trump, Clinton-----?" [id number] D-l-[id number]-w Recall 3/3  Skin: No rash noted. No erythema.  Psychiatric: She has a normal mood and affect. Her behavior is normal.          Assessment & Plan:

## 2016-09-26 NOTE — Assessment & Plan Note (Signed)
Vague Will try the gabapentin Check labs though

## 2016-09-26 NOTE — Assessment & Plan Note (Signed)
BP Readings from Last 3 Encounters:  09/26/16 140/88  05/28/16 (!) 146/94  02/14/16 140/90   Reasonable control Discussed lifestyle

## 2016-09-26 NOTE — Assessment & Plan Note (Signed)
Continues to follow up [facility]

## 2016-09-26 NOTE — Assessment & Plan Note (Signed)
Still in remission Will plan lifelong Rx

## 2016-09-26 NOTE — Patient Instructions (Addendum)
Please decrease the estrogen to only 6 days a week (1/2 tab). Every few weeks, take away another weekly dose till you (hopefully) eventually are off it altogether.   DASH Eating Plan DASH stands for "Dietary Approaches to Stop Hypertension." The DASH eating plan is a healthy eating plan that has been shown to reduce high blood pressure (hypertension). It may also reduce your risk for type 2 diabetes, heart disease, and stroke. The DASH eating plan may also help with weight loss. What are tips for following this plan? General guidelines  Avoid eating more than 2,300 mg (milligrams) of salt (sodium) a day. If you have hypertension, you may need to reduce your sodium intake to 1,500 mg a day.  Limit alcohol intake to no more than 1 drink a day for nonpregnant women and 2 drinks a day for men. One drink equals 12 oz of beer, 5 oz of wine, or 1 oz of hard liquor.  Work with your health care provider to maintain a healthy body weight or to lose weight. Ask what an ideal weight is for you.  Get at least 30 minutes of exercise that causes your heart to beat faster (aerobic exercise) most days of the week. Activities may include walking, swimming, or biking.  Work with your health care provider or diet and nutrition specialist (dietitian) to adjust your eating plan to your individual calorie needs. Reading food labels  Check food labels for the amount of sodium per serving. Choose foods with less than 5 percent of the Daily Value of sodium. Generally, foods with less than 300 mg of sodium per serving fit into this eating plan.  To find whole grains, look for the word "whole" as the first word in the ingredient list. Shopping  Buy products labeled as "low-sodium" or "no salt added."  Buy fresh foods. Avoid canned foods and premade or frozen meals. Cooking  Avoid adding salt when cooking. Use salt-free seasonings or herbs instead of table salt or sea salt. Check with your health care provider or  pharmacist before using salt substitutes.  Do not fry foods. Cook foods using healthy methods such as baking, boiling, grilling, and broiling instead.  Cook with heart-healthy oils, such as olive, canola, soybean, or sunflower oil. Meal planning   Eat a balanced diet that includes: ? 5 or more servings of fruits and vegetables each day. At each meal, try to fill half of your plate with fruits and vegetables. ? Up to 6-8 servings of whole grains each day. ? Less than 6 oz of lean meat, poultry, or fish each day. A 3-oz serving of meat is about the same size as a deck of cards. One egg equals 1 oz. ? 2 servings of low-fat dairy each day. ? A serving of nuts, seeds, or beans 5 times each week. ? Heart-healthy fats. Healthy fats called Omega-3 fatty acids are found in foods such as flaxseeds and coldwater fish, like sardines, salmon, and mackerel.  Limit how much you eat of the following: ? Canned or prepackaged foods. ? Food that is high in trans fat, such as fried foods. ? Food that is high in saturated fat, such as fatty meat. ? Sweets, desserts, sugary drinks, and other foods with added sugar. ? Full-fat dairy products.  Do not salt foods before eating.  Try to eat at least 2 vegetarian meals each week.  Eat more home-cooked food and less restaurant, buffet, and fast food.  When eating at a restaurant, ask that your  food be prepared with less salt or no salt, if possible. What foods are recommended? The items listed may not be a complete list. Talk with your dietitian about what dietary choices are best for you. Grains Whole-grain or whole-wheat bread. Whole-grain or whole-wheat pasta. Brown rice. [place]. Bulgur. Whole-grain and low-sodium cereals. Pita bread. Low-fat, low-sodium crackers. Whole-wheat flour tortillas. Vegetables Fresh or frozen vegetables (raw, steamed, roasted, or grilled). Low-sodium or reduced-sodium tomato and vegetable juice. Low-sodium or  reduced-sodium tomato sauce and tomato paste. Low-sodium or reduced-sodium canned vegetables. Fruits All fresh, dried, or frozen fruit. Canned fruit in natural juice (without added sugar). Meat and other protein foods Skinless chicken or [place]. Ground chicken or [place]. Pork with fat trimmed off. Fish and seafood. Egg whites. Dried beans, peas, or lentils. Unsalted nuts, nut butters, and seeds. Unsalted canned beans. Lean cuts of beef with fat trimmed off. Low-sodium, lean deli meat. Dairy Low-fat (1%) or fat-free (skim) milk. Fat-free, low-fat, or reduced-fat cheeses. Nonfat, low-sodium ricotta or cottage cheese. Low-fat or nonfat yogurt. Low-fat, low-sodium cheese. Fats and oils Soft margarine without trans fats. Vegetable oil. Low-fat, reduced-fat, or light mayonnaise and salad dressings (reduced-sodium). Canola, safflower, olive, soybean, and sunflower oils. Avocado. Seasoning and other foods Herbs. Spices. Seasoning mixes without salt. Unsalted popcorn and pretzels. Fat-free sweets. What foods are not recommended? The items listed may not be a complete list. Talk with your dietitian about what dietary choices are best for you. Grains Baked goods made with fat, such as croissants, muffins, or some breads. Dry pasta or rice meal packs. Vegetables Creamed or fried vegetables. Vegetables in a cheese sauce. Regular canned vegetables (not low-sodium or reduced-sodium). Regular canned tomato sauce and paste (not low-sodium or reduced-sodium). Regular tomato and vegetable juice (not low-sodium or reduced-sodium). Angie Fava. Olives. Fruits Canned fruit in a light or heavy syrup. Fried fruit. Fruit in cream or butter sauce. Meat and other protein foods Fatty cuts of meat. Ribs. Fried meat. Berniece Salines. Sausage. Bologna and other processed lunch meats. Salami. Fatback. Hotdogs. Bratwurst. Salted nuts and seeds. Canned beans with added salt. Canned or smoked fish. Whole eggs or egg yolks. Chicken or [place]  with skin. Dairy Whole or 2% milk, cream, and half-and-half. Whole or full-fat cream cheese. Whole-fat or sweetened yogurt. Full-fat cheese. Nondairy creamers. Whipped toppings. Processed cheese and cheese spreads. Fats and oils Butter. Stick margarine. Lard. Shortening. Ghee. Bacon fat. Tropical oils, such as coconut, palm kernel, or palm oil. Seasoning and other foods Salted popcorn and pretzels. Onion salt, garlic salt, seasoned salt, table salt, and sea salt. Worcestershire sauce. Tartar sauce. Barbecue sauce. Teriyaki sauce. Soy sauce, including reduced-sodium. Steak sauce. Canned and packaged gravies. Fish sauce. Oyster sauce. Cocktail sauce. Horseradish that you find on the shelf. Ketchup. Mustard. Meat flavorings and tenderizers. Bouillon cubes. Hot sauce and Tabasco sauce. Premade or packaged marinades. Premade or packaged taco seasonings. Relishes. Regular salad dressings. Where to find more information:  National Heart, Lung, and [place]: https://wilson-eaton.com/  American Heart Association: www.heart.org Summary  The DASH eating plan is a healthy eating plan that has been shown to reduce high blood pressure (hypertension). It may also reduce your risk for type 2 diabetes, heart disease, and stroke.  With the DASH eating plan, you should limit salt (sodium) intake to 2,300 mg a day. If you have hypertension, you may need to reduce your sodium intake to 1,500 mg a day.  When on the DASH eating plan, aim to eat more fresh fruits and vegetables,  whole grains, lean proteins, low-fat dairy, and heart-healthy fats.  Work with your health care provider or diet and nutrition specialist (dietitian) to adjust your eating plan to your individual calorie needs. This information is not intended to replace advice given to you by your health care provider. Make sure you discuss any questions you have with your health care provider. Document Released: 04/03/2011 Document Revised: 04/07/2016  Document Reviewed: 04/07/2016 Elsevier Interactive Patient Education  2017 [employer].

## 2016-09-26 NOTE — Assessment & Plan Note (Signed)
I have personally reviewed the Medicare Annual Wellness questionnaire and have noted 1. The patient's medical and social history 2. Their use of alcohol, tobacco or illicit drugs 3. Their current medications and supplements 4. The patient's functional ability including ADL's, fall risks, home safety risks and hearing or visual             impairment. 5. Diet and physical activities 6. Evidence for depression or mood disorders  The patients weight, height, BMI and visual acuity have been recorded in the chart I have made referrals, counseling and provided education to the patient based review of the above and I have provided the pt with a written personalized care plan for preventive services.  I have provided you with a copy of your personalized plan for preventive services. Please take the time to review along with your updated medication list.  Recent colon ---repeat due 2022 mammo just done---recommended every 2 years Discussed fitness and DASH info given Pneumovax today Yearly vaccine

## 2016-09-26 NOTE — Assessment & Plan Note (Signed)
Discussed trying to wean off estrogen

## 2016-09-30 LAB — PROTEIN ELECTROPHORESIS, SERUM
ALPHA-1-GLOBULIN: 0.6 g/dL — AB (ref 0.2–0.3)
Albumin ELP: 3.8 g/dL (ref 3.8–4.8)
Alpha-2-Globulin: 0.9 g/dL (ref 0.5–0.9)
BETA 2: 0.4 g/dL (ref 0.2–0.5)
Beta Globulin: 0.5 g/dL (ref 0.4–0.6)
GAMMA GLOBULIN: 1.1 g/dL (ref 0.8–1.7)
TOTAL PROTEIN, SERUM ELECTROPHOR: 7.1 g/dL (ref 6.1–8.1)

## 2016-10-02 ENCOUNTER — Encounter: Payer: Self-pay | Admitting: Internal Medicine

## 2016-10-02 MED ORDER — MELOXICAM 15 MG PO TABS: 15.0000 mg | ORAL_TABLET | Freq: Every day | ORAL | 1 refills | Status: DC | PRN

## 2016-11-06 ENCOUNTER — Other Ambulatory Visit: Payer: Self-pay | Admitting: Internal Medicine

## 2016-12-11 ENCOUNTER — Other Ambulatory Visit: Payer: Self-pay | Admitting: Internal Medicine

## 2016-12-12 ENCOUNTER — Other Ambulatory Visit: Payer: Self-pay | Admitting: Internal Medicine

## 2016-12-13 ENCOUNTER — Encounter: Payer: Self-pay | Admitting: *Deleted

## 2016-12-13 ENCOUNTER — Ambulatory Visit
Admission: EM | Admit: 2016-12-13 | Discharge: 2016-12-13 | Disposition: A | Discharge status: Home / Self Care | Payer: Medicare Other | Attending: Family Medicine | Admitting: Family Medicine

## 2016-12-13 DIAGNOSIS — R350 Frequency of micturition: Secondary | ICD-10-CM | POA: Diagnosis present

## 2016-12-13 DIAGNOSIS — N390 Urinary tract infection, site not specified: Secondary | ICD-10-CM | POA: Diagnosis not present

## 2016-12-13 DIAGNOSIS — R102 Pelvic and perineal pain: Secondary | ICD-10-CM

## 2016-12-13 DIAGNOSIS — Z7982 Long term (current) use of aspirin: Secondary | ICD-10-CM | POA: Insufficient documentation

## 2016-12-13 DIAGNOSIS — R319 Hematuria, unspecified: Secondary | ICD-10-CM | POA: Diagnosis not present

## 2016-12-13 DIAGNOSIS — Z87442 Personal history of urinary calculi: Secondary | ICD-10-CM | POA: Insufficient documentation

## 2016-12-13 DIAGNOSIS — Z880 Allergy status to penicillin: Secondary | ICD-10-CM | POA: Diagnosis not present

## 2016-12-13 DIAGNOSIS — Z79899 Other long term (current) drug therapy: Secondary | ICD-10-CM | POA: Insufficient documentation

## 2016-12-13 DIAGNOSIS — I10 Essential (primary) hypertension: Secondary | ICD-10-CM | POA: Diagnosis not present

## 2016-12-13 DIAGNOSIS — E785 Hyperlipidemia, unspecified: Secondary | ICD-10-CM | POA: Diagnosis not present

## 2016-12-13 DIAGNOSIS — Z85820 Personal history of malignant melanoma of skin: Secondary | ICD-10-CM | POA: Insufficient documentation

## 2016-12-13 LAB — URINALYSIS, COMPLETE (UACMP) WITH MICROSCOPIC
BILIRUBIN URINE: NEGATIVE
Glucose, UA: NEGATIVE mg/dL
Ketones, ur: NEGATIVE mg/dL
NITRITE: NEGATIVE
PROTEIN: NEGATIVE mg/dL
Specific Gravity, Urine: 1.02 (ref 1.005–1.030)
pH: 7.5 (ref 5.0–8.0)

## 2016-12-13 MED ORDER — FLUCONAZOLE 150 MG PO TABS: 150.0000 mg | ORAL_TABLET | Freq: Once | ORAL | 0 refills | Status: AC

## 2016-12-13 MED ORDER — CEPHALEXIN 500 MG PO CAPS: 500.0000 mg | ORAL_CAPSULE | Freq: Two times a day (BID) | ORAL | 0 refills | Status: DC

## 2016-12-13 MED ORDER — PHENAZOPYRIDINE HCL 200 MG PO TABS: 200.0000 mg | ORAL_TABLET | Freq: Three times a day (TID) | ORAL | 0 refills | Status: DC | PRN

## 2016-12-13 NOTE — ED Triage Notes (Signed)
Patient started having symptoms of lower back pain, urinary frequency, and pelvic discomfort 4 days ago. Patient does have a history of UTI and kidney stones.

## 2016-12-13 NOTE — ED Provider Notes (Signed)
[facility]    CSN: [id number] Arrival date & time: 12/13/16  1024     History   Chief Complaint Chief Complaint  Patient presents with  . Urinary Frequency    HPI Regina Chapman is a 66 y.o. female.   Patient is a 66 year old white female who's had a history of symptoms of UTI now for about 4 days. She has had recurrent UTIs she's also had kidney stones she strongly feels this is a UTI since she is not having the pain that she normally has with kidney stones. She does have stones in her kidneys that are not doing anything at this time she reports. She has had lithotripsy before as well as surgery to remove stone she reports frequency difficulty or burning urination and she states these are consistent with when she has a UTI and the urine has an odder as well. Habits she does not smoke. She she is not allergic to any drugs she has a GI intolerance to Augmentin. No family medical history that's relevant. 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 History hyperlipidemia hypertension and kidney stones. She denies any vaginal discharge and reports she is not sexually active husband died about 3 years ago. The last UTI was a few months ago   The history is provided by the patient. No language interpreter was used.  Urinary Frequency  This is a new problem. The current episode started more than 2 days ago. The problem occurs constantly. The problem has been gradually worsening. Pertinent negatives include no chest pain, no abdominal pain, no headaches and no shortness of breath. Nothing aggravates the symptoms. Nothing relieves the symptoms. She has tried nothing for the symptoms. The treatment provided no relief.    Past Medical History:  Diagnosis Date  . Cancer ([place])    Melanoma  . 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 Depression   . History of melanoma 1985  . Hyperlipidemia   . Hypertension   . Kidney stones   . Obesity   . Obesity   .  Psoriasis     Patient Active Problem List   Diagnosis Date Noted  . Neuropathy 09/26/2016  . Postmenopausal estrogen deficiency 07/20/2015  . Advance directive discussed with patient 07/20/2015  . 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 Acute infection of nasal sinus 04/04/2015  . Obesity   . Routine general medical examination at a health care facility 02/09/2013  . Hyperlipidemia   . Hypertension   . Recurrent major depression in remission ([place]) 09/16/2008    Past Surgical History:  Procedure Laterality Date  . CATARACT EXTRACTION W/ INTRAOCULAR LENS  IMPLANT, BILATERAL  2013  . COLOSTOMY  2007  . LAPAROSCOPIC ASSISTED VAGINAL HYSTERECTOMY  1987   FOR ENDOMETRIOSIS  . LUNG REMOVAL, PARTIAL    . MELANOMA EXCISION  1985    OB History    Gravida Para Term Preterm AB Living   1 1 1  0 0 1   SAB TAB Ectopic Multiple Live Births   0 0 0 0 1       Home Medications    Prior to Admission medications   Medication Sig Start Date End Date Taking? Authorizing Provider  aspirin 81 MG tablet Take 81 mg by mouth 2 (two) times a week.    Yes [provider]  Biotin 10000 MCG TABS Take 1 tablet by mouth daily.   Yes [provider]  buPROPion (  WELLBUTRIN XL) 150 MG 24 hr tablet TAKE 1 TABLET(150 MG) BY MOUTH TWICE DAILY 09/26/16  Yes Viviana Simpler I, MD  cholecalciferol (VITAMIN D) 1000 units tablet Take 1,000 Units by mouth daily.   Yes [provider]  estradiol (ESTRACE) 0.5 MG tablet Take 0.5 tablets (0.25 mg total) by mouth daily. 09/26/16  Yes [name], MD  gabapentin (NEURONTIN) 300 MG capsule Take 1-2 capsules (300-600 mg total) by mouth 3 (three) times daily. 09/26/16  Yes Viviana Simpler I, MD  hydrochlorothiazide (HYDRODIURIL) 25 MG tablet TAKE 1 TABLET(25 MG) BY MOUTH DAILY 09/26/16  Yes Viviana Simpler I, MD  meloxicam (MOBIC) 15 MG tablet TAKE 1 TABLET(15 MG) BY MOUTH DAILY AS NEEDED FOR PAIN 12/12/16  Yes Viviana Simpler I, MD  Omega 3 1000 MG  CAPS Take 2 capsules by mouth daily.   Yes [provider]  potassium citrate (UROCIT-K) 10 MEQ (1080 MG) SR tablet Take by mouth. 02/12/16 02/11/17 Yes [provider]  traZODone (DESYREL) 50 MG tablet TAKE 1 TO 2 TABLETS(50 TO 100 MG) BY MOUTH AT BEDTIME AS NEEDED 09/26/16  Yes [name], MD  cephALEXin (KEFLEX) 500 MG capsule Take 1 capsule (500 mg total) by mouth 2 (two) times daily. 12/13/16   Frederich Cha, MD  fluconazole (DIFLUCAN) 150 MG tablet Take 1 tablet (150 mg total) by mouth once. 12/13/16 12/13/16  Frederich Cha, MD  phenazopyridine (PYRIDIUM) 200 MG tablet Take 1 tablet (200 mg total) by mouth 3 (three) times daily as needed for pain. 12/13/16   Frederich Cha, MD    Family History Family History  Problem Relation Age of Onset  . Cervical cancer Sister        CERVICAL   . Heart disease Mother   . Dementia Mother   . Breast cancer Other   . Diabetes Neg Hx   . Hypertension Neg Hx   . Colon cancer Neg Hx     Social History Social History  Substance Use Topics  . Smoking status: Never Smoker  . Smokeless tobacco: Never Used  . Alcohol use 1.8 oz/week    3 Glasses of wine per week     Comment: 3 PER WEEK     Allergies   Augmentin [amoxicillin-pot clavulanate]   Review of Systems Review of Systems  Respiratory: Negative for shortness of breath.   Cardiovascular: Negative for chest pain.  Gastrointestinal: Negative for abdominal pain.  Genitourinary: Positive for dysuria, frequency, pelvic pain and urgency. Negative for dyspareunia, vaginal discharge and vaginal pain.  Neurological: Negative for headaches.  All other systems reviewed and are negative.    Physical Exam Triage Vital Signs ED Triage Vitals  Enc Vitals Group     BP 12/13/16 1044 (!) 160/69     Pulse Rate 12/13/16 1044 73     Resp 12/13/16 1044 16     Temp 12/13/16 1044 98.3 F (36.8 C)     Temp Source 12/13/16 1044 Oral     SpO2 12/13/16 1044 97 %     Weight 12/13/16  1046 180 lb (81.6 kg)     Height 12/13/16 1046 5\' 2"  (1.575 m)     Head Circumference --      Peak Flow --      Pain Score 12/13/16 1046 7     Pain Loc --      Pain Edu? --      Excl. in [place]? --    No data found.   Updated Vital  Signs BP (!) 160/69 (BP Location: Left Arm)   Pulse 73   Temp 98.3 F (36.8 C) (Oral)   Resp 16   Ht 5\' 2"  (1.575 m)   Wt 180 lb (81.6 kg)   SpO2 97%   BMI 32.92 kg/m   Visual Acuity Right Eye Distance:   Left Eye Distance:   Bilateral Distance:    Right Eye Near:   Left Eye Near:    Bilateral Near:     Physical Exam  Constitutional: She is oriented to person, place, and time. She appears well-developed and well-nourished.  HENT:  Head: Normocephalic and atraumatic.  Right Ear: External ear normal.  Left Ear: External ear normal.  Eyes: Pupils are equal, round, and reactive to light.  Neck: Normal range of motion.  Pulmonary/Chest: Effort normal.  Abdominal: Soft.  Musculoskeletal: Normal range of motion. She exhibits no tenderness.  Neurological: She is alert and oriented to person, place, and time.  Skin: Skin is warm.  Psychiatric: She has a normal mood and affect.  Vitals reviewed.    UC Treatments / Results  Labs (all labs ordered are listed, but only abnormal results are displayed) Labs Reviewed  URINALYSIS, COMPLETE (UACMP) WITH MICROSCOPIC - Abnormal; Notable for the following:       Result Value   Hgb urine dipstick TRACE (*)    Leukocytes, UA TRACE (*)    Squamous Epithelial / LPF 0-5 (*)    Bacteria, UA RARE (*)    All other components within normal limits    EKG  EKG Interpretation None       Radiology No results found.  Procedures Procedures (including critical care time)  Medications Ordered in UC Medications - No data to display  Results for orders placed or performed during the hospital encounter of 12/13/16  Urinalysis, Complete w Microscopic  Result Value Ref Range   Color, Urine YELLOW YELLOW    APPearance CLEAR CLEAR   Specific Gravity, Urine 1.020 1.005 - 1.030   pH 7.5 5.0 - 8.0   Glucose, UA NEGATIVE NEGATIVE mg/dL   Hgb urine dipstick TRACE (A) NEGATIVE   Bilirubin Urine NEGATIVE NEGATIVE   Ketones, ur NEGATIVE NEGATIVE mg/dL   Protein, ur NEGATIVE NEGATIVE mg/dL   Nitrite NEGATIVE NEGATIVE   Leukocytes, UA TRACE (A) NEGATIVE   Squamous Epithelial / LPF 0-5 (A) NONE SEEN   WBC, UA 0-5 0 - 5 WBC/hpf   RBC / HPF 0-5 0 - 5 RBC/hpf   Bacteria, UA RARE (A) NONE SEEN   Initial Impression / Assessment and Plan / UC Course  I have reviewed the triage vital signs and the nursing notes.  Pertinent labs & imaging results that were available during my care of the patient were reviewed by me and considered in my medical decision making (see chart for details).   will place patient on Keflex 500 mg 1 tablet twice a day for week Pyridium 200 mg 3 times a day and Diflucan for yeast and repeat in a week if needed. Follow up with if needed. Urine culture was also obtained  Final Clinical Impressions(s) / UC Diagnoses   Final diagnoses:  Lower urinary tract infectious disease  Urinary tract infection with hematuria, site unspecified    New Prescriptions New Prescriptions   CEPHALEXIN (KEFLEX) 500 MG CAPSULE    Take 1 capsule (500 mg total) by mouth 2 (two) times daily.   FLUCONAZOLE (DIFLUCAN) 150 MG TABLET    Take 1 tablet (150 mg total)  by mouth once.   PHENAZOPYRIDINE (PYRIDIUM) 200 MG TABLET    Take 1 tablet (200 mg total) by mouth 3 (three) times daily as needed for pain.   Note: This dictation was prepared with Dragon dictation along with smaller phrase technology. Any transcriptional errors that result from this process are unintentional. Controlled Substance Prescriptions [place] Controlled Substance Registry consulted?Marland Kitchen Not Applicable   Frederich Cha, MD 12/13/16 [phone]

## 2016-12-15 LAB — URINE CULTURE
Culture: <10000 — AB
SPECIAL REQUESTS: NORMAL

## 2017-03-03 DIAGNOSIS — Z23 Encounter for immunization: Secondary | ICD-10-CM | POA: Diagnosis not present

## 2017-03-04 DIAGNOSIS — H26493 Other secondary cataract, bilateral: Secondary | ICD-10-CM | POA: Diagnosis not present

## 2017-03-04 DIAGNOSIS — Z961 Presence of intraocular lens: Secondary | ICD-10-CM | POA: Diagnosis not present

## 2017-03-09 ENCOUNTER — Ambulatory Visit: Payer: Self-pay
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
## 2017-03-11 ENCOUNTER — Encounter: Payer: Self-pay | Admitting: Family Medicine

## 2017-03-11 ENCOUNTER — Encounter: Payer: Self-pay | Admitting: *Deleted

## 2017-03-11 ENCOUNTER — Ambulatory Visit (INDEPENDENT_AMBULATORY_CARE_PROVIDER_SITE_OTHER): Payer: Medicare Other | Admitting: Family Medicine

## 2017-03-11 ENCOUNTER — Ambulatory Visit (INDEPENDENT_AMBULATORY_CARE_PROVIDER_SITE_OTHER)
Admission: RE | Admit: 2017-03-11 | Discharge: 2017-03-11 | Disposition: A | Discharge status: Home / Self Care | Payer: Medicare Other | Source: Ambulatory Visit | Attending: Family Medicine | Admitting: Family Medicine

## 2017-03-11 VITALS — BP 138/80 | HR 74 | Temp 99.0°F | Ht 62.0 in | Wt 181.8 lb

## 2017-03-11 DIAGNOSIS — M19012 Primary osteoarthritis, left shoulder: Secondary | ICD-10-CM | POA: Diagnosis not present

## 2017-03-11 DIAGNOSIS — M25512 Pain in left shoulder: Secondary | ICD-10-CM | POA: Diagnosis not present

## 2017-03-11 DIAGNOSIS — M7502 Adhesive capsulitis of left shoulder: Secondary | ICD-10-CM

## 2017-03-11 MED ORDER — METHYLPREDNISOLONE ACETATE 40 MG/ML IJ SUSP
80.0000 mg | Freq: Once | INTRAMUSCULAR | Status: AC
  Administered 2017-03-11: 80 mg via INTRA_ARTICULAR

## 2017-03-11 NOTE — Progress Notes (Signed)
Dr. Frederico Hamman T. Amaya Blakeman, MD, [facility] [place], [address] Phone: [phone] Fax: [phone]  03/11/2017  Patient: Regina Chapman, MRN: [id number], DOB: 04-02-1951, 66 y.o.  Primary Physician:  [name], MD   Chief Complaint  Patient presents with  . Shoulder Pain    Left x 1 month   Subjective:   Regina Chapman is a 66 y.o. very pleasant female patient who presents with the following:  L shoulder hurt for about a month. Bumped into the shoulder, then has moved around all over her shoulder.   After she bumped her shoulder, there was a bruise, and she had some restriction of motion.  Since that time she has not used her shoulder quite as much now she has developed a dull aching pain throughout the shoulder as well as the scapular region and throughout basically her entire shoulder region.  She has pain at night, pain with motion, pain with sitting and without doing anything.  She has pain in a T-shirt distribution.  She also has limitation in motion diffusely.  Past Medical History, Surgical History, Social History, Family History, Problem List, Medications, and Allergies have been reviewed and updated if relevant.  Patient Active Problem List   Diagnosis Date Noted  . Neuropathy 09/26/2016  . Postmenopausal estrogen deficiency 07/20/2015  . Advance directive discussed with patient 07/20/2015  . 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 Acute infection of nasal sinus 04/04/2015  . Obesity   . Routine general medical examination at a health care facility 02/09/2013  . Hyperlipidemia   . Hypertension   . Recurrent major depression in remission ([place]) 09/16/2008    Past Medical History:  Diagnosis Date  . Cancer ([place])    Melanoma  . 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 Depression   . History of melanoma 1985  . Hyperlipidemia   . Hypertension   . Kidney stones   . Obesity   . Obesity   . Psoriasis      Past Surgical History:  Procedure Laterality Date  . CATARACT EXTRACTION W/ INTRAOCULAR LENS  IMPLANT, BILATERAL  2013  . COLOSTOMY  2007  . LAPAROSCOPIC ASSISTED VAGINAL HYSTERECTOMY  1987   FOR ENDOMETRIOSIS  . LUNG REMOVAL, PARTIAL    . MELANOMA EXCISION  1985    Social History   Socioeconomic History  . Marital status: Widowed    Spouse name: Not on file  . Number of children: 1  . Years of education: Not on file  . Highest education level: Not on file  Social Needs  . Financial resource strain: Not on file  . Food insecurity - worry: Not on file  . Food insecurity - inability: Not on file  . Transportation needs - medical: Not on file  . Transportation needs - non-medical: Not on file  Occupational History  . Occupation: Radiographer, therapeutic service    Comment: Retired 2013  Tobacco Use  . Smoking status: Never Smoker  . Smokeless tobacco: Never Used  Substance and Sexual Activity  . Alcohol use: Yes    Alcohol/week: 1.8 oz    Types: 3 Glasses of wine per week    Comment: 3 PER WEEK  . Drug use: No  . Sexual activity: No    Birth control/protection: Surgical  Other Topics Concern  . Not on file  Social History Narrative   No living will or health care POA  Would want son to make decisions   Would accept resuscitation but no prolonged ventilation   No tube feeds if cognitively unaware    Family History  Problem Relation Age of Onset  . Cervical cancer Sister        CERVICAL   . Heart disease Mother   . Dementia Mother   . Breast cancer Other   . Diabetes Neg Hx   . Hypertension Neg Hx   . Colon cancer Neg Hx     Allergies  Allergen Reactions  . Augmentin [Amoxicillin-Pot Clavulanate] Nausea And Vomiting    Able to tolerate Amoxil    Medication list reviewed and updated in full in [place].  GEN: No fevers, chills. Nontoxic. Primarily MSK c/o today. MSK: Detailed in the HPI GI: tolerating PO intake without difficulty Neuro:  No numbness, parasthesias, or tingling associated. Otherwise the pertinent positives of the ROS are noted above.   Objective:   BP 138/80   Pulse 74   Temp 99 F (37.2 C) (Oral)   Ht 5\' 2"  (1.575 m)   Wt 181 lb 12 oz (82.4 kg)   BMI 33.24 kg/m    GEN: WDWN, NAD, Non-toxic, Alert & Oriented x 3 HEENT: Atraumatic, Normocephalic.  Ears and Nose: No external deformity. EXTR: No clubbing/cyanosis/edema NEURO: Normal gait.  PSYCH: Normally interactive. Conversant. Not depressed or anxious appearing.  Calm demeanor.   Shoulder: R and L Inspection: No muscle wasting or winging Ecchymosis/edema: neg  AC joint, scapula, clavicle: NT Cervical spine: NT, full ROM Spurling's: neg ABNORMAL SIDE TESTED: L UNLESS OTHERWISE NOTED, THE CONTRALATERAL SIDE HAS FULL RANGE OF MOTION. Abduction: 5/5, LIMITED TO 100 DEGREES Flexion: 5/5, LIMITED TO 110 DEGNO ROM  IR, lift-off: 5/5. TESTED AT 90 DEGREES OF ABDUCTION, LIMITED TO 0 DEGREES ER at neutral:  5/5, TESTED AT 90 DEGREES OF ABDUCTION, LIMITED TO 40 DEGREES AC crossover and compression: PAIN Drop Test: neg Empty Can: neg Supraspinatus insertion: NT Bicipital groove: NT ALL OTHER SPECIAL TESTING EQUIVOCAL GIVEN LOSS OF MOTION C5-T1 intact Sensation intact Grip 5/5    Radiology: Dg Shoulder Left  Result Date: 03/11/2017 CLINICAL DATA:  Left shoulder pain for 1 month. EXAM: LEFT SHOULDER - 2+ VIEW COMPARISON:  None. FINDINGS: There is no evidence of fracture or dislocation. 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 Well corticated few mm ossific fragments along the lateral humeral head. 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 Well corticated ossific fragments along the lateral humeral head may represent capsular calcifications or calcific tendinitis. Electronically Signed   By: Fidela Salisbury M.D.   On: 03/11/2017 15:26     Assessment and  Plan:   Adhesive capsulitis of left shoulder - Plan: Ambulatory referral to Physical Therapy, methylPREDNISolone acetate (DEPO-MEDROL) injection 80 mg  Acute pain of left shoulder - Plan: DG Shoulder Left, Ambulatory referral to Physical Therapy  Patient was given a systematic ROM protocol from [facility] to be done daily. Emphasized adherence and recommended formal PT.  Tylenol or NSAID of choice prn for pain relief  Patient will be sent for formal PT for aggressive frozen shoulder ROM. Will need RTC str and scapular stabilization to fix underlying mechanics.  Intraarticular corticosteroid injections in Adhesive Capsulitis have clearly been shown to be of benefit.   Intrarticular Shoulder Injection, L Verbal consent was obtained from the patient. Risks including infection explained and contrasted with benefits and alternatives. Patient prepped with Chloraprep and Ethyl Chloride used for anesthesia. An  intraarticular shoulder injection was performed using the posterior approach. The patient tolerated the procedure well and had decreased pain post injection. No complications. Injection: 8 cc of Lidocaine 1% and 2 mL Depo-Medrol 40 mg. Needle: 22 gauge   Follow-up: 2 mo  Future Appointments  Date Time Provider [place]  05/11/2017 10:15 AM Pierson Vantol, Frederico Hamman, MD [facility]    Meds ordered this encounter  Medications  . methylPREDNISolone acetate (DEPO-MEDROL) injection 80 mg   Medications Discontinued During This Encounter  Medication Reason  . cephALEXin (KEFLEX) 500 MG capsule Completed Course  . meloxicam (MOBIC) 15 MG tablet Completed Course  . phenazopyridine (PYRIDIUM) 200 MG tablet Completed Course   Orders Placed This Encounter  Procedures  . DG Shoulder Left  . Ambulatory referral to Physical Therapy    Signed,  Frederico Hamman T. Anastasios Melander, MD   Allergies as of 03/11/2017      Reactions   Augmentin [amoxicillin-pot Clavulanate] Nausea And Vomiting   Able to  tolerate Amoxil      Medication List        Accurate as of 03/11/17 11:59 PM. Always use your most recent med list.          aspirin 81 MG tablet Take 81 mg by mouth 2 (two) times a week.   Biotin 10000 MCG Tabs Take 1 tablet by mouth daily.   buPROPion 150 MG 24 hr tablet Commonly known as:  WELLBUTRIN XL TAKE 1 TABLET(150 MG) BY MOUTH TWICE DAILY   cholecalciferol 1000 units tablet Commonly known as:  VITAMIN D Take 1,000 Units by mouth daily.   estradiol 0.5 MG tablet Commonly known as:  ESTRACE Take 0.5 tablets (0.25 mg total) by mouth daily.   gabapentin 300 MG capsule Commonly known as:  NEURONTIN Take 1-2 capsules (300-600 mg total) by mouth 3 (three) times daily.   hydrochlorothiazide 25 MG tablet Commonly known as:  HYDRODIURIL TAKE 1 TABLET(25 MG) BY MOUTH DAILY   Omega 3 1000 MG Caps Take 2 capsules by mouth daily.   traZODone 50 MG tablet Commonly known as:  DESYREL TAKE 1 TO 2 TABLETS(50 TO 100 MG) BY MOUTH AT BEDTIME AS NEEDED

## 2017-03-11 NOTE — Patient Instructions (Signed)

## 2017-03-12 ENCOUNTER — Telehealth: Payer: Self-pay

## 2017-03-12 ENCOUNTER — Encounter: Payer: Self-pay | Admitting: Family Medicine

## 2017-03-12 NOTE — Telephone Encounter (Signed)
Discussed that this doesn't seem worrisome Advice reviewed and approved

## 2017-03-12 NOTE — Telephone Encounter (Signed)
Pt received depomedrol inj on 03/11/17; today pts face is red, puffy (slight swelling in face); no swelling in mouth,tongue or throat and no SOB or difficulty breathing; pt wonders if could be mild reaction to cortisone shot; pt had cortisone shot 15 yrs ago and thinks had some type reaction then. Pt has taken benadryl in the past and wants to know if can take benadryl; Dr Lorelei Pont out of office and Dr [name] advised to take Benadryl at bedtime, could be mild reaction to the steroid. If pt condition worsens with problem breathing or tongue or throat swelling pt to go to ED otherwise to cb if not improved. Pt voiced understanding. FYI to Dr [name].

## 2017-03-13 DIAGNOSIS — M25512 Pain in left shoulder: Secondary | ICD-10-CM | POA: Diagnosis not present

## 2017-03-17 DIAGNOSIS — M25512 Pain in left shoulder: Secondary | ICD-10-CM | POA: Diagnosis not present

## 2017-03-20 DIAGNOSIS — M25512 Pain in left shoulder: Secondary | ICD-10-CM | POA: Diagnosis not present

## 2017-03-24 DIAGNOSIS — M25512 Pain in left shoulder: Secondary | ICD-10-CM | POA: Diagnosis not present

## 2017-03-25 DIAGNOSIS — Z961 Presence of intraocular lens: Secondary | ICD-10-CM | POA: Diagnosis not present

## 2017-03-25 DIAGNOSIS — H35363 Drusen (degenerative) of macula, bilateral: Secondary | ICD-10-CM | POA: Diagnosis not present

## 2017-03-25 DIAGNOSIS — H35033 Hypertensive retinopathy, bilateral: Secondary | ICD-10-CM | POA: Diagnosis not present

## 2017-03-25 DIAGNOSIS — H26493 Other secondary cataract, bilateral: Secondary | ICD-10-CM | POA: Diagnosis not present

## 2017-03-25 DIAGNOSIS — H26491 Other secondary cataract, right eye: Secondary | ICD-10-CM | POA: Diagnosis not present

## 2017-03-26 DIAGNOSIS — M25512 Pain in left shoulder: Secondary | ICD-10-CM | POA: Diagnosis not present

## 2017-03-31 DIAGNOSIS — M25512 Pain in left shoulder: Secondary | ICD-10-CM | POA: Diagnosis not present

## 2017-04-03 DIAGNOSIS — M25512 Pain in left shoulder: Secondary | ICD-10-CM | POA: Diagnosis not present

## 2017-04-10 DIAGNOSIS — M25512 Pain in left shoulder: Secondary | ICD-10-CM | POA: Diagnosis not present

## 2017-05-11 ENCOUNTER — Ambulatory Visit: Payer: Medicare Other | Admitting: Family Medicine

## 2017-06-17 ENCOUNTER — Encounter: Payer: Self-pay | Admitting: Emergency Medicine

## 2017-06-17 ENCOUNTER — Emergency Department
Admission: EM | Admit: 2017-06-17 | Discharge: 2017-06-17 | Disposition: A | Discharge status: Home / Self Care | Payer: Medicare Other | Attending: Emergency Medicine | Admitting: Emergency Medicine

## 2017-06-17 DIAGNOSIS — I10 Essential (primary) hypertension: Secondary | ICD-10-CM | POA: Diagnosis not present

## 2017-06-17 DIAGNOSIS — Z79899 Other long term (current) drug therapy: Secondary | ICD-10-CM | POA: Insufficient documentation

## 2017-06-17 DIAGNOSIS — Z7982 Long term (current) use of aspirin: Secondary | ICD-10-CM | POA: Insufficient documentation

## 2017-06-17 DIAGNOSIS — E785 Hyperlipidemia, unspecified: Secondary | ICD-10-CM | POA: Diagnosis not present

## 2017-06-17 DIAGNOSIS — R040 Epistaxis: Secondary | ICD-10-CM | POA: Diagnosis not present

## 2017-06-17 MED ORDER — CLONIDINE HCL 0.1 MG PO TABS
0.1000 mg | ORAL_TABLET | Freq: Once | ORAL | Status: AC
  Administered 2017-06-17: 0.1 mg via ORAL
  Filled 2017-06-17: qty 1

## 2017-06-17 NOTE — ED Provider Notes (Signed)
[facility] Emergency Department Provider Note   First MD Initiated Contact with Patient 06/17/17 0100     (approximate)  I have reviewed the triage vital signs and the nursing notes.   HISTORY  Chief Complaint Epistaxis   HPI Regina Chapman is a 67 y.o. female with below list of chronic medical conditions presents to the emergency department with epistaxis times 2 hours from the right nostril.  EMS reports a blood pressure of 564 systolic on arrival patient's blood pressure 166/123.  Patient actively bleeding at this time.  Patient denies any previous nosebleeds no nasal trauma.   Past Medical History:  Diagnosis Date  . Cancer ([place])    Melanoma  . 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 Depression   . History of melanoma 1985  . Hyperlipidemia   . Hypertension   . Kidney stones   . Obesity   . Obesity   . Psoriasis     Patient Active Problem List   Diagnosis Date Noted  . Neuropathy 09/26/2016  . Postmenopausal estrogen deficiency 07/20/2015  . Advance directive discussed with patient 07/20/2015  . 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 Acute infection of nasal sinus 04/04/2015  . Obesity   . Routine general medical examination at a health care facility 02/09/2013  . Hyperlipidemia   . Hypertension   . Recurrent major depression in remission ([place]) 09/16/2008    Past Surgical History:  Procedure Laterality Date  . CATARACT EXTRACTION W/ INTRAOCULAR LENS  IMPLANT, BILATERAL  2013  . COLOSTOMY  2007  . LAPAROSCOPIC ASSISTED VAGINAL HYSTERECTOMY  1987   FOR ENDOMETRIOSIS  . LUNG REMOVAL, PARTIAL    . [place]    Prior to Admission medications   Medication Sig Start Date End Date Taking? Authorizing Provider  aspirin 81 MG tablet Take 81 mg by mouth 2 (two) times a week.     [provider]  Biotin 10000 MCG TABS Take 1 tablet by mouth daily.    [provider]  buPROPion (WELLBUTRIN XL) 150 MG 24 hr  tablet TAKE 1 TABLET(150 MG) BY MOUTH TWICE DAILY 09/26/16   Viviana Simpler I, MD  cholecalciferol (VITAMIN D) 1000 units tablet Take 1,000 Units by mouth daily.    [provider]  estradiol (ESTRACE) 0.5 MG tablet Take 0.5 tablets (0.25 mg total) by mouth daily. 09/26/16   [name], MD  gabapentin (NEURONTIN) 300 MG capsule Take 1-2 capsules (300-600 mg total) by mouth 3 (three) times daily. 09/26/16   [name], MD  hydrochlorothiazide (HYDRODIURIL) 25 MG tablet TAKE 1 TABLET(25 MG) BY MOUTH DAILY 09/26/16   Viviana Simpler I, MD  Omega 3 1000 MG CAPS Take 2 capsules by mouth daily.    [provider]  traZODone (DESYREL) 50 MG tablet TAKE 1 TO 2 TABLETS(50 TO 100 MG) BY MOUTH AT BEDTIME AS NEEDED 09/26/16   [name], MD    Allergies Augmentin [amoxicillin-pot clavulanate] and Cortisone  Family History  Problem Relation Age of Onset  . Cervical cancer Sister        CERVICAL   . Heart disease Mother   . Dementia Mother   . Breast cancer Other   . Diabetes Neg Hx   . Hypertension Neg Hx   . Colon cancer Neg Hx     Social History Social History   Tobacco Use  . Smoking status: Never Smoker  . Smokeless tobacco: Never Used  Substance Use Topics  . Alcohol use: Yes    Alcohol/week: 1.8 oz    Types: 3 Glasses of wine per week    Comment: 3 PER WEEK  . Drug use: No    Review of Systems Constitutional: No fever/chills Eyes: No visual changes. ENT: No sore throat.  Positive for epistaxis Cardiovascular: Denies chest pain. Respiratory: Denies shortness of breath. Gastrointestinal: No abdominal pain.  No nausea, no vomiting.  No diarrhea.  No constipation. Genitourinary: Negative for dysuria. Musculoskeletal: Negative for neck pain.  Negative for back pain. Integumentary: Negative for rash. Neurological: Negative for headaches, focal weakness or numbness.   ____________________________________________   PHYSICAL EXAM:  VITAL  SIGNS: ED Triage Vitals  Enc Vitals Group     BP 06/17/17 0106 (!) 166/123     Pulse Rate 06/17/17 0106 (!) 101     Resp 06/17/17 0106 18     Temp 06/17/17 0106 97.6 F (36.4 C)     Temp Source 06/17/17 0106 Axillary     SpO2 06/17/17 0106 98 %     Weight 06/17/17 0108 82.1 kg (181 lb)     Height --      Head Circumference --      Peak Flow --      Pain Score --      Pain Loc --      Pain Edu? --      Excl. in [place]? --     Constitutional: Alert and oriented. Well appearing and in no acute distress. Eyes: Conjunctivae are normal.  Head: Atraumatic. Nose: Right nostril anterior epistaxis noted with active bleeding.  No bleeding noted from the left nostril Mouth/Throat: Mucous membranes are moist.  Oropharynx non-erythematous. Neck: No stridor.   Cardiovascular: Normal rate, regular rhythm. Good peripheral circulation. Grossly normal heart sounds. Respiratory: Normal respiratory effort.  No retractions. Lungs CTAB. Neurologic:  Normal speech and language. No gross focal neurologic deficits are appreciated.  Skin:  Skin is warm, dry and intact. No rash noted. Psychiatric: Mood and affect are normal. Speech and behavior are normal.    Procedures   ____________________________________________   INITIAL IMPRESSION / ASSESSMENT AND PLAN / ED COURSE  As part of my medical decision making, I reviewed the following data within the electronic MEDICAL RECORD [id number] year old female presenting with active epistaxis from the right nostril.  Afrin introduced into the patient's nostril on arrival nasal clamp applied.  After 20 minutes of application of nasal clamp no active bleeding noted.  Patient given clonidine 0.1 mg regarding hypertension.  Patient's blood pressure improved with blood pressure at present 140/70.  Patient observed for an additional hour with no epistaxis noted. ____________________________________________  FINAL CLINICAL IMPRESSION(S) / ED DIAGNOSES  Final diagnoses:   Epistaxis  Hypertension, unspecified type     MEDICATIONS GIVEN DURING THIS VISIT:  Medications  cloNIDine (CATAPRES) tablet 0.1 mg (not administered)     ED Discharge Orders    None       Note:  This document was prepared using Dragon voice recognition software and may include unintentional dictation errors.    Gregor Hams, MD 06/17/17 [phone]

## 2017-06-17 NOTE — ED Triage Notes (Signed)
Pt arrived to ED via EMS from home where pt has had epistaxis x2 hours. Pts BP on arrival is 166/123. Pt denies blood thinners.

## 2017-06-18 ENCOUNTER — Telehealth: Payer: Self-pay

## 2017-06-18 NOTE — Telephone Encounter (Signed)
Spoke to pt. She wanted to schedule a visit for BP concerns and is requesting to have labs done. She is doing better from ER visit and said she will bring BP recordings. Scheduled pt for March 1.

## 2017-06-22 ENCOUNTER — Ambulatory Visit: Payer: Self-pay | Admitting: *Deleted

## 2017-06-22 ENCOUNTER — Ambulatory Visit (INDEPENDENT_AMBULATORY_CARE_PROVIDER_SITE_OTHER): Payer: Medicare Other | Admitting: Internal Medicine

## 2017-06-22 ENCOUNTER — Other Ambulatory Visit: Payer: Self-pay

## 2017-06-22 ENCOUNTER — Encounter: Payer: Self-pay | Admitting: Internal Medicine

## 2017-06-22 VITALS — BP 192/90 | HR 84 | Temp 98.3°F | Ht 62.0 in | Wt 183.8 lb

## 2017-06-22 DIAGNOSIS — I10 Essential (primary) hypertension: Secondary | ICD-10-CM

## 2017-06-22 MED ORDER — LOSARTAN POTASSIUM-HCTZ 100-25 MG PO TABS: 1.0000 | ORAL_TABLET | Freq: Every day | ORAL | 3 refills | Status: DC

## 2017-06-22 NOTE — Progress Notes (Signed)
Subjective:    Patient ID: Regina Chapman, female    DOB: [birth year], 67 y.o.   MRN: [id number]  HPI Here due to elevated blood pressure  Went to ER last week--- had emotional day (got notice of escrow account being $600 short) Then in evening, nose just started dripping blood She tried compression ---DIL who is paramedic helped her. But it still didn't stop Called 911---got nose clip twice and it stopped BP was somewhat high then No recurrence of nosebleed--but does have ENT follow up  Has been checking BP since then DIL got her a new machine  Slight headache occasionally--nothing notable No chest pain No SOB Slight dizziness---no syncope No edema  Current Outpatient Medications on File Prior to Visit  Medication Sig Dispense Refill  . aspirin 81 MG tablet Take 81 mg by mouth 2 (two) times a week.     . Biotin 10000 MCG TABS Take 1 tablet by mouth daily.    Marland Kitchen buPROPion (WELLBUTRIN XL) 150 MG 24 hr tablet TAKE 1 TABLET(150 MG) BY MOUTH TWICE DAILY 90 tablet 3  . cholecalciferol (VITAMIN D) 1000 units tablet Take 1,000 Units by mouth daily.    Marland Kitchen estradiol (ESTRACE) 0.5 MG tablet Take 0.5 tablets (0.25 mg total) by mouth daily. 45 tablet 3  . gabapentin (NEURONTIN) 300 MG capsule Take 1-2 capsules (300-600 mg total) by mouth 3 (three) times daily. 60 capsule 11  . hydrochlorothiazide (HYDRODIURIL) 25 MG tablet TAKE 1 TABLET(25 MG) BY MOUTH DAILY 90 tablet 3  . Omega 3 1000 MG CAPS Take 2 capsules by mouth daily.    . traZODone (DESYREL) 50 MG tablet TAKE 1 TO 2 TABLETS(50 TO 100 MG) BY MOUTH AT BEDTIME AS NEEDED 180 tablet 3   Current Facility-Administered Medications on File Prior to Visit  Medication Dose Route Frequency Provider Last Rate Last Dose  . 0.9 %  sodium chloride infusion  500 mL Intravenous Continuous Doran Stabler, MD        Allergies  Allergen Reactions  . Augmentin [Amoxicillin-Pot Clavulanate] Nausea And Vomiting    Able to tolerate Amoxil  .  Cortisone Other (See Comments)    Pt had depomedrol inj and next day had red face,puffiness in face and slight swelling in face; no SOB or swelling in mouth,tongue or throat.    Past Medical History:  Diagnosis Date  . Cancer ([place])    Melanoma  . 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 Depression   . History of melanoma 1985  . Hyperlipidemia   . Hypertension   . Kidney stones   . Obesity   . Obesity   . Psoriasis     Past Surgical History:  Procedure Laterality Date  . CATARACT EXTRACTION W/ INTRAOCULAR LENS  IMPLANT, BILATERAL  2013  . COLOSTOMY  2007  . LAPAROSCOPIC ASSISTED VAGINAL HYSTERECTOMY  1987   FOR ENDOMETRIOSIS  . LUNG REMOVAL, PARTIAL    . MELANOMA EXCISION  1985    Family History  Problem Relation Age of Onset  . Cervical cancer Sister        CERVICAL   . Heart disease Mother   . Dementia Mother   . Breast cancer Other   . Diabetes Neg Hx   . Hypertension Neg Hx   . Colon cancer Neg Hx     Social History   Socioeconomic History  . Marital status: Widowed    Spouse name: Not on file  . Number of children:  1  . Years of education: Not on file  . Highest education level: Not on file  Social Needs  . Financial resource strain: Not on file  . Food insecurity - worry: Not on file  . Food insecurity - inability: Not on file  . Transportation needs - medical: Not on file  . Transportation needs - non-medical: Not on file  Occupational History  . Occupation: Radiographer, therapeutic service    Comment: Retired 2013  Tobacco Use  . Smoking status: Never Smoker  . Smokeless tobacco: Never Used  Substance and Sexual Activity  . Alcohol use: Yes    Alcohol/week: 1.8 oz    Types: 3 Glasses of wine per week    Comment: 3 PER WEEK  . Drug use: No  . Sexual activity: No    Birth control/protection: Surgical  Other Topics Concern  . Not on file  Social History Narrative   No living will or health care POA   Would want son to make decisions   Would  accept resuscitation but no prolonged ventilation   No tube feeds if cognitively unaware   Review of Systems  No diarrhea No flushing Appetite is okay Sleeping fine     Objective:   Physical Exam  Constitutional: She appears well-developed. No distress.  Neck: No thyromegaly present.  Cardiovascular: Normal rate, regular rhythm and normal heart sounds. Exam reveals no gallop.  No murmur heard. Pulmonary/Chest: Effort normal and breath sounds normal. No respiratory distress. She has no wheezes. She has no rales.  Abdominal: Soft. There is no tenderness.  No renal bruits  Musculoskeletal: She exhibits no edema.  Lymphadenopathy:    She has no cervical adenopathy.          Assessment & Plan:

## 2017-06-22 NOTE — Telephone Encounter (Signed)
Pt reports elevated B/P. Seen in ED 06/17/17 for epistaxis with B/P 213/?  and 166/123. B/P this am 213/76 right arm;  168/73 left arm. (New home monitor). States has been ranging 188/96 - 153/81 over last several days. During call, Left arm 196/89, after 5 minutes 189/81. HRR at 76. On HCTZ, states no missed doses. Reports mild headache and dizziness "comes and goes."  Denies CP, SOB. No further nose bleeds. Pt sounds anxious re: readings. Appt made for today with Dr. [name]. Care advise given per protocol.  Reason for Disposition . Systolic BP  >= 035 OR Diastolic >= 465  Answer Assessment - Initial Assessment Questions 1. BLOOD PRESSURE: "What is the blood pressure?" "Did you take at least two measurements 5 minutes apart?"     This AM left arm 168/73:  Right arm  213/76. During call.. 2. ONSET: "When did you take your blood pressure?"     During call 3. HOW: "How did you obtain the blood pressure?" (e.g., visiting nurse, automatic home BP monitor)    Home monitor  4. HISTORY: "Do you have a history of high blood pressure?"    Yes 5. MEDICATIONS: "Are you taking any medications for blood pressure?" "Have you missed any doses recently?"    Yes, no Missed doses 6. OTHER SYMPTOMS: "Do you have any symptoms?" (e.g., headache, chest pain, blurred vision, difficulty breathing, weakness)    Mild headache and dizziness, comes and goes 7. PREGNANCY: "Is there any chance you are pregnant?" "When was your last menstrual period?"     no  Protocols used: HIGH BLOOD PRESSURE-A-AH

## 2017-06-22 NOTE — Assessment & Plan Note (Signed)
BP Readings from Last 3 Encounters:  06/22/17 (!) 192/90  06/17/17 140/70  03/11/17 138/80   Unsure why suddenly so high No symptoms of recurrence of carcinoid Will check labs Add losartan to HCTZ Renal artery duplex

## 2017-06-22 NOTE — Patient Instructions (Signed)
Stop the HCTZ and take the losartan/HCTZ instead.

## 2017-06-22 NOTE — Telephone Encounter (Signed)
Spoke to pt. She will bring it with her

## 2017-06-22 NOTE — Telephone Encounter (Signed)
Will assess her at the [place]. Make sure she brings in her home monitor so we can make sure it is accurate

## 2017-06-23 LAB — COMPREHENSIVE METABOLIC PANEL
ALT: 24 U/L (ref 0–35)
AST: 19 U/L (ref 0–37)
Albumin: 4.3 g/dL (ref 3.5–5.2)
Alkaline Phosphatase: 58 U/L (ref 39–117)
BUN: 12 mg/dL (ref 6–23)
CALCIUM: 10.8 mg/dL — AB (ref 8.4–10.5)
CHLORIDE: 100 meq/L (ref 96–112)
CO2: 31 meq/L (ref 19–32)
CREATININE: 0.78 mg/dL (ref 0.40–1.20)
GFR: 78.21 mL/min (ref >60.00)
Glucose, Bld: 95 mg/dL (ref 70–99)
Potassium: 4.1 mEq/L (ref 3.5–5.1)
SODIUM: 139 meq/L (ref 135–145)
Total Bilirubin: 0.5 mg/dL (ref 0.2–1.2)
Total Protein: 7.9 g/dL (ref 6.0–8.3)

## 2017-06-23 LAB — CBC
HEMATOCRIT: 43.2 % (ref 36.0–46.0)
HEMOGLOBIN: 14.8 g/dL (ref 12.0–15.0)
MCHC: 34.2 g/dL (ref 30.0–36.0)
MCV: 89.7 fl (ref 78.0–100.0)
Platelets: 290 10*3/uL (ref 150.0–400.0)
RBC: 4.82 Mil/uL (ref 3.87–5.11)
RDW: 13.5 % (ref 11.5–15.5)
WBC: 9.8 10*3/uL (ref 4.0–10.5)

## 2017-06-23 LAB — MICROALBUMIN / CREATININE URINE RATIO
CREATININE, U: 57.4 mg/dL
MICROALB UR: 1.1 mg/dL (ref 0.0–1.9)
Microalb Creat Ratio: 1.9 mg/g (ref 0.0–30.0)

## 2017-06-23 LAB — T4, FREE: Free T4: 0.97 ng/dL (ref 0.60–1.60)

## 2017-06-24 DIAGNOSIS — R040 Epistaxis: Secondary | ICD-10-CM | POA: Diagnosis not present

## 2017-06-26 ENCOUNTER — Ambulatory Visit: Payer: Medicare Other | Admitting: Internal Medicine

## 2017-07-02 ENCOUNTER — Ambulatory Visit (INDEPENDENT_AMBULATORY_CARE_PROVIDER_SITE_OTHER): Payer: Medicare Other

## 2017-07-02 DIAGNOSIS — I10 Essential (primary) hypertension: Secondary | ICD-10-CM

## 2017-07-03 NOTE — Progress Notes (Signed)
error 

## 2017-07-07 ENCOUNTER — Ambulatory Visit (INDEPENDENT_AMBULATORY_CARE_PROVIDER_SITE_OTHER): Payer: Medicare Other | Admitting: Internal Medicine

## 2017-07-07 ENCOUNTER — Encounter: Payer: Self-pay | Admitting: Internal Medicine

## 2017-07-07 VITALS — BP 124/80 | HR 75 | Temp 98.1°F | Wt 182.0 lb

## 2017-07-07 DIAGNOSIS — I10 Essential (primary) hypertension: Secondary | ICD-10-CM

## 2017-07-07 DIAGNOSIS — F3340 Major depressive disorder, recurrent, in remission, unspecified: Secondary | ICD-10-CM

## 2017-07-07 LAB — RENAL FUNCTION PANEL
Albumin: 4.2 g/dL (ref 3.5–5.2)
BUN: 16 mg/dL (ref 6–23)
CALCIUM: 10.4 mg/dL (ref 8.4–10.5)
CHLORIDE: 99 meq/L (ref 96–112)
CO2: 32 mEq/L (ref 19–32)
CREATININE: 0.77 mg/dL (ref 0.40–1.20)
GFR: 79.38 mL/min (ref >60.00)
Glucose, Bld: 97 mg/dL (ref 70–99)
POTASSIUM: 4.1 meq/L (ref 3.5–5.1)
Phosphorus: 3.7 mg/dL (ref 2.3–4.6)
Sodium: 138 mEq/L (ref 135–145)

## 2017-07-07 NOTE — Assessment & Plan Note (Signed)
BP Readings from Last 3 Encounters:  07/07/17 124/80  06/22/17 (!) 192/90  06/17/17 140/70   Repeat 124/70 on right Doing fine  Will recheck renal function

## 2017-07-07 NOTE — Assessment & Plan Note (Signed)
Still fine on medication

## 2017-07-07 NOTE — Progress Notes (Signed)
Subjective:    Patient ID: Regina Chapman, female    DOB: 02-14-1951, 67 y.o.   MRN: [id number]  HPI Here for follow up of hypertension  No problems with the new medication Headache briefly after starting--gone now BP at home has been better  No chest pain No SOB No dizziness No edema  Mood still fine No depression on the medication  Current Outpatient Medications on File Prior to Visit  Medication Sig Dispense Refill  . aspirin 81 MG tablet Take 81 mg by mouth 2 (two) times a week.     . Biotin 10000 MCG TABS Take 1 tablet by mouth daily.    Marland Kitchen buPROPion (WELLBUTRIN XL) 150 MG 24 hr tablet TAKE 1 TABLET(150 MG) BY MOUTH TWICE DAILY 90 tablet 3  . cholecalciferol (VITAMIN D) 1000 units tablet Take 1,000 Units by mouth daily.    Marland Kitchen estradiol (ESTRACE) 0.5 MG tablet Take 0.5 tablets (0.25 mg total) by mouth daily. 45 tablet 3  . gabapentin (NEURONTIN) 300 MG capsule Take 1-2 capsules (300-600 mg total) by mouth 3 (three) times daily. 60 capsule 11  . losartan-hydrochlorothiazide (HYZAAR) 100-25 MG tablet Take 1 tablet by mouth daily. 90 tablet 3  . Omega 3 1000 MG CAPS Take 2 capsules by mouth daily.    . traZODone (DESYREL) 50 MG tablet TAKE 1 TO 2 TABLETS(50 TO 100 MG) BY MOUTH AT BEDTIME AS NEEDED 180 tablet 3   No current facility-administered medications on file prior to visit.     Allergies  Allergen Reactions  . Augmentin [Amoxicillin-Pot Clavulanate] Nausea And Vomiting    Able to tolerate Amoxil  . Cortisone Other (See Comments)    Pt had depomedrol inj and next day had red face,puffiness in face and slight swelling in face; no SOB or swelling in mouth,tongue or throat.    Past Medical History:  Diagnosis Date  . Cancer ([place])    Melanoma  . 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 Depression   . History of melanoma 1985  . Hyperlipidemia   . Hypertension   . Kidney stones   . Obesity   . Obesity   . Psoriasis     Past Surgical History:  Procedure  Laterality Date  . CATARACT EXTRACTION W/ INTRAOCULAR LENS  IMPLANT, BILATERAL  2013  . COLOSTOMY  2007  . LAPAROSCOPIC ASSISTED VAGINAL HYSTERECTOMY  1987   FOR ENDOMETRIOSIS  . LUNG REMOVAL, PARTIAL    . MELANOMA EXCISION  1985    Family History  Problem Relation Age of Onset  . Cervical cancer Sister        CERVICAL   . Heart disease Mother   . Dementia Mother   . Breast cancer Other   . Diabetes Neg Hx   . Hypertension Neg Hx   . Colon cancer Neg Hx     Social History   Socioeconomic History  . Marital status: Widowed    Spouse name: Not on file  . Number of children: 1  . Years of education: Not on file  . Highest education level: Not on file  Social Needs  . Financial resource strain: Not on file  . Food insecurity - worry: Not on file  . Food insecurity - inability: Not on file  . Transportation needs - medical: Not on file  . Transportation needs - non-medical: Not on file  Occupational History  . Occupation: Radiographer, therapeutic service    Comment: Retired 2013  Tobacco Use  .  Smoking status: Never Smoker  . Smokeless tobacco: Never Used  Substance and Sexual Activity  . Alcohol use: Yes    Alcohol/week: 1.8 oz    Types: 3 Glasses of wine per week    Comment: 3 PER WEEK  . Drug use: No  . Sexual activity: No    Birth control/protection: Surgical  Other Topics Concern  . Not on file  Social History Narrative   No living will or health care POA   Would want son to make decisions   Would accept resuscitation but no prolonged ventilation   No tube feeds if cognitively unaware   Review of Systems  Sleeps fair Appetite is okay No more nose bleeds     Objective:   Physical Exam  Constitutional: She appears well-developed. No distress.  Neck: No thyromegaly present.  Cardiovascular: Normal rate, regular rhythm and normal heart sounds. Exam reveals no gallop.  No murmur heard. Pulmonary/Chest: Effort normal and breath sounds normal. No  respiratory distress. She has no wheezes. She has no rales.  Musculoskeletal: She exhibits no edema.  Lymphadenopathy:    She has no cervical adenopathy.          Assessment & Plan:

## 2017-07-14 DIAGNOSIS — N200 Calculus of kidney: Secondary | ICD-10-CM | POA: Diagnosis not present

## 2017-08-21 DIAGNOSIS — N281 Cyst of kidney, acquired: Secondary | ICD-10-CM | POA: Diagnosis not present

## 2017-08-21 DIAGNOSIS — R34 Anuria and oliguria: Secondary | ICD-10-CM | POA: Diagnosis not present

## 2017-08-21 DIAGNOSIS — Z87442 Personal history of urinary calculi: Secondary | ICD-10-CM | POA: Diagnosis not present

## 2017-08-21 DIAGNOSIS — N200 Calculus of kidney: Secondary | ICD-10-CM | POA: Diagnosis not present

## 2017-08-21 DIAGNOSIS — R82991 Hypocitraturia: Secondary | ICD-10-CM | POA: Diagnosis not present

## 2017-09-07 ENCOUNTER — Telehealth: Payer: Self-pay | Admitting: Internal Medicine

## 2017-09-07 MED ORDER — LOSARTAN POTASSIUM 100 MG PO TABS: 100.0000 mg | ORAL_TABLET | Freq: Every day | ORAL | 3 refills | Status: DC

## 2017-09-07 MED ORDER — HYDROCHLOROTHIAZIDE 25 MG PO TABS: 25.0000 mg | ORAL_TABLET | Freq: Every day | ORAL | 3 refills | Status: DC

## 2017-09-07 NOTE — Telephone Encounter (Signed)
Pt last seen 07/07/17.

## 2017-09-07 NOTE — Telephone Encounter (Signed)
Copied from [place] [id number]. Topic: Quick Communication - See Telephone Encounter >> Sep 07, 2017  1:59 PM Synthia Innocent wrote: CRM for notification. See Telephone encounter for: 09/07/17. Received letter from [facility] stating that losartan-hydrochlorothiazide (HYZAAR) 100-25 MG tablet has been recalled.

## 2017-09-07 NOTE — Telephone Encounter (Signed)
See if they can find substitute or the 2 meds separately. If not, what comparable ARB combos do they have?

## 2017-09-07 NOTE — Telephone Encounter (Signed)
Split the meds Send to another pharmacy if they won't fill it

## 2017-09-07 NOTE — Telephone Encounter (Signed)
Spoke to the pharmacist. He said it will not help to separate the medication because it will be recalled, also. He suggested valsartan/hctz or telmisartan/hctz.

## 2017-09-07 NOTE — Telephone Encounter (Signed)
New rx for split medications sent to the pharmacy.

## 2017-09-17 ENCOUNTER — Other Ambulatory Visit: Payer: Self-pay | Admitting: Internal Medicine

## 2017-09-17 DIAGNOSIS — Z1231 Encounter for screening mammogram for malignant neoplasm of breast: Secondary | ICD-10-CM

## 2017-10-07 ENCOUNTER — Ambulatory Visit: Payer: Medicare Other | Admitting: Internal Medicine

## 2017-10-08 ENCOUNTER — Encounter: Payer: Self-pay | Admitting: Internal Medicine

## 2017-10-08 ENCOUNTER — Ambulatory Visit (INDEPENDENT_AMBULATORY_CARE_PROVIDER_SITE_OTHER): Payer: Medicare Other | Admitting: Internal Medicine

## 2017-10-08 ENCOUNTER — Ambulatory Visit
Admission: RE | Admit: 2017-10-08 | Discharge: 2017-10-08 | Disposition: A | Discharge status: Home / Self Care | Payer: Medicare Other | Source: Ambulatory Visit | Attending: Internal Medicine | Admitting: Internal Medicine

## 2017-10-08 ENCOUNTER — Encounter

## 2017-10-08 VITALS — BP 118/70 | HR 85 | Temp 98.3°F | Resp 24 | Ht 62.0 in | Wt 185.0 lb

## 2017-10-08 DIAGNOSIS — Z1231 Encounter for screening mammogram for malignant neoplasm of breast: Secondary | ICD-10-CM

## 2017-10-08 DIAGNOSIS — J0140 Acute pansinusitis, unspecified: Secondary | ICD-10-CM | POA: Insufficient documentation

## 2017-10-08 MED ORDER — DOXYCYCLINE HYCLATE 100 MG PO TABS: 100.0000 mg | ORAL_TABLET | Freq: Two times a day (BID) | ORAL | 0 refills | Status: DC

## 2017-10-08 MED ORDER — HYDROCODONE-HOMATROPINE 5-1.5 MG/5ML PO SYRP: 5.0000 mL | ORAL_SOLUTION | Freq: Every evening | ORAL | 0 refills | Status: DC | PRN

## 2017-10-08 NOTE — Assessment & Plan Note (Signed)
Clearly started viral Will give cough syrup Analgesics Start doxy if worsens

## 2017-10-08 NOTE — Progress Notes (Signed)
Subjective:    Patient ID: Regina Chapman, female    DOB: Mar 25, 1951, 67 y.o.   MRN: [id number]  HPI Here due to cough  Started with a sore throat and ear popping Now she feels it in her brochial tubes Still with frontal and maxillary pain Started over a week ago Only a little post nasal drip Cough day and night Fever at first--- 100.5 No SOB No chills or sweats  Tried OTC cough meds--some help but briefly Cough keeping her up at night  Current Outpatient Medications on File Prior to Visit  Medication Sig Dispense Refill  . aspirin 81 MG tablet Take 81 mg by mouth 2 (two) times a week.     . Biotin 10000 MCG TABS Take 1 tablet by mouth daily.    Marland Kitchen buPROPion (WELLBUTRIN XL) 150 MG 24 hr tablet TAKE 1 TABLET(150 MG) BY MOUTH TWICE DAILY 90 tablet 3  . cholecalciferol (VITAMIN D) 1000 units tablet Take 1,000 Units by mouth daily.    Marland Kitchen estradiol (ESTRACE) 0.5 MG tablet Take 0.5 tablets (0.25 mg total) by mouth daily. 45 tablet 3  . gabapentin (NEURONTIN) 300 MG capsule Take 1-2 capsules (300-600 mg total) by mouth 3 (three) times daily. 60 capsule 11  . hydrochlorothiazide (HYDRODIURIL) 25 MG tablet Take 1 tablet (25 mg total) by mouth daily. 90 tablet 3  . losartan (COZAAR) 100 MG tablet Take 1 tablet (100 mg total) by mouth daily. 90 tablet 3  . Omega 3 1000 MG CAPS Take 2 capsules by mouth daily.    . traZODone (DESYREL) 50 MG tablet TAKE 1 TO 2 TABLETS(50 TO 100 MG) BY MOUTH AT BEDTIME AS NEEDED 180 tablet 3   No current facility-administered medications on file prior to visit.     Allergies  Allergen Reactions  . Augmentin [Amoxicillin-Pot Clavulanate] Nausea And Vomiting    Able to tolerate Amoxil  . Cortisone Other (See Comments)    Pt had depomedrol inj and next day had red face,puffiness in face and slight swelling in face; no SOB or swelling in mouth,tongue or throat.    Past Medical History:  Diagnosis Date  . Cancer ([place])    Melanoma  . Carcinoid tumor 2005    in Lungs  . Depression   . History of melanoma 1985  . Hyperlipidemia   . Hypertension   . Kidney stones   . Obesity   . Obesity   . Psoriasis     Past Surgical History:  Procedure Laterality Date  . ABDOMINAL HYSTERECTOMY    . CATARACT EXTRACTION W/ INTRAOCULAR LENS  IMPLANT, BILATERAL  2013  . COLOSTOMY  2007  . LAPAROSCOPIC ASSISTED VAGINAL HYSTERECTOMY  1987   FOR ENDOMETRIOSIS  . LUNG REMOVAL, PARTIAL    . MELANOMA EXCISION  1985  . OOPHORECTOMY      Family History  Problem Relation Age of Onset  . Cervical cancer Sister        CERVICAL   . Heart disease Mother   . Dementia Mother   . Breast cancer Other   . Diabetes Neg Hx   . Hypertension Neg Hx   . Colon cancer Neg Hx     Social History   Socioeconomic History  . Marital status: Widowed    Spouse name: Not on file  . Number of children: 1  . Years of education: Not on file  . Highest education level: Not on file  Occupational History  . Occupation: Geophysicist/field seismologist  Comment: Retired 2013  Social Needs  . Financial resource strain: Not on file  . Food insecurity:    Worry: Not on file    Inability: Not on file  . Transportation needs:    Medical: Not on file    Non-medical: Not on file  Tobacco Use  . Smoking status: Never Smoker  . Smokeless tobacco: Never Used  Substance and Sexual Activity  . Alcohol use: Yes    Alcohol/week: 1.8 oz    Types: 3 Glasses of wine per week    Comment: 3 PER WEEK  . Drug use: No  . Sexual activity: Never    Birth control/protection: Surgical  Lifestyle  . Physical activity:    Days per week: Not on file    Minutes per session: Not on file  . Stress: Not on file  Relationships  . Social connections:    Talks on phone: Not on file    Gets together: Not on file    Attends religious service: Not on file    Active member of club or organization: Not on file    Attends meetings of clubs or organizations: Not on file    Relationship  status: Not on file  . Intimate partner violence:    Fear of current or ex partner: Not on file    Emotionally abused: Not on file    Physically abused: Not on file    Forced sexual activity: Not on file  Other Topics Concern  . Not on file  Social History Narrative   No living will or health care POA   Would want son to make decisions   Would accept resuscitation but no prolonged ventilation   No tube feeds if cognitively unaware   Review of Systems No rash Some nausea---but no vomiting No diarrhea Appetite is off    Objective:   Physical Exam  Constitutional: No distress.  Coarse cough  HENT:  Mild maxillary and frontal tenderness Moderate nasal inflammation Scant pharyngeal injection TMs normal  Neck: No thyromegaly present.  Respiratory: Effort normal. No respiratory distress. She has no wheezes. She has no rales.  Lymphadenopathy:    She has no cervical adenopathy.  Skin: No rash noted.           Assessment & Plan:

## 2017-10-08 NOTE — Patient Instructions (Signed)
Please start the antibiotic only if you are worsening in the next few days. 

## 2017-10-09 MED ORDER — GUAIFENESIN-CODEINE 100-10 MG/5ML PO SYRP: 5.0000 mL | ORAL_SOLUTION | Freq: Every evening | ORAL | 0 refills | Status: DC | PRN

## 2017-10-09 NOTE — Telephone Encounter (Signed)
Dr. [name] is out of the office today.  Can you look at this please.

## 2017-10-27 ENCOUNTER — Other Ambulatory Visit: Payer: Self-pay | Admitting: Internal Medicine

## 2017-11-12 DIAGNOSIS — D485 Neoplasm of uncertain behavior of skin: Secondary | ICD-10-CM | POA: Diagnosis not present

## 2017-11-12 DIAGNOSIS — Z85820 Personal history of malignant melanoma of skin: Secondary | ICD-10-CM | POA: Diagnosis not present

## 2017-11-12 DIAGNOSIS — D1801 Hemangioma of skin and subcutaneous tissue: Secondary | ICD-10-CM | POA: Diagnosis not present

## 2017-11-12 DIAGNOSIS — L400 Psoriasis vulgaris: Secondary | ICD-10-CM | POA: Diagnosis not present

## 2017-11-12 DIAGNOSIS — Z85828 Personal history of other malignant neoplasm of skin: Secondary | ICD-10-CM | POA: Diagnosis not present

## 2017-11-12 DIAGNOSIS — D2239 Melanocytic nevi of other parts of face: Secondary | ICD-10-CM | POA: Diagnosis not present

## 2017-11-17 ENCOUNTER — Other Ambulatory Visit: Payer: Self-pay | Admitting: Internal Medicine

## 2017-11-24 DIAGNOSIS — L400 Psoriasis vulgaris: Secondary | ICD-10-CM | POA: Diagnosis not present

## 2017-11-26 DIAGNOSIS — L400 Psoriasis vulgaris: Secondary | ICD-10-CM | POA: Diagnosis not present

## 2017-11-30 DIAGNOSIS — L400 Psoriasis vulgaris: Secondary | ICD-10-CM | POA: Diagnosis not present

## 2017-12-02 DIAGNOSIS — L400 Psoriasis vulgaris: Secondary | ICD-10-CM | POA: Diagnosis not present

## 2017-12-04 ENCOUNTER — Encounter

## 2017-12-04 ENCOUNTER — Encounter: Payer: Self-pay | Admitting: Internal Medicine

## 2017-12-04 ENCOUNTER — Ambulatory Visit (INDEPENDENT_AMBULATORY_CARE_PROVIDER_SITE_OTHER): Payer: Medicare Other | Admitting: Internal Medicine

## 2017-12-04 VITALS — BP 132/78 | HR 81 | Temp 98.6°F | Ht 61.5 in | Wt 185.2 lb

## 2017-12-04 DIAGNOSIS — Z7189 Other specified counseling: Secondary | ICD-10-CM

## 2017-12-04 DIAGNOSIS — D3A090 Benign carcinoid tumor of the bronchus and lung: Secondary | ICD-10-CM

## 2017-12-04 DIAGNOSIS — F3340 Major depressive disorder, recurrent, in remission, unspecified: Secondary | ICD-10-CM | POA: Diagnosis not present

## 2017-12-04 DIAGNOSIS — Z0000 Encounter for general adult medical examination without abnormal findings: Secondary | ICD-10-CM

## 2017-12-04 DIAGNOSIS — I10 Essential (primary) hypertension: Secondary | ICD-10-CM | POA: Diagnosis not present

## 2017-12-04 DIAGNOSIS — Z780 Asymptomatic menopausal state: Secondary | ICD-10-CM

## 2017-12-04 DIAGNOSIS — Z79890 Hormone replacement therapy (postmenopausal): Secondary | ICD-10-CM | POA: Diagnosis not present

## 2017-12-04 DIAGNOSIS — E8940 Asymptomatic postprocedural ovarian failure: Secondary | ICD-10-CM

## 2017-12-04 DIAGNOSIS — G629 Polyneuropathy, unspecified: Secondary | ICD-10-CM

## 2017-12-04 MED ORDER — TRAZODONE HCL 50 MG PO TABS: ORAL_TABLET | ORAL | 3 refills | Status: DC

## 2017-12-04 MED ORDER — ESTRADIOL 0.5 MG PO TABS: 0.2500 mg | ORAL_TABLET | Freq: Every day | ORAL | 3 refills | Status: DC

## 2017-12-04 NOTE — Addendum Note (Signed)
Addended by: Modena Nunnery on: 12/04/2017 08:46 AM   Modules accepted: Orders

## 2017-12-04 NOTE — Assessment & Plan Note (Signed)
See social history 

## 2017-12-04 NOTE — Assessment & Plan Note (Signed)
I have personally reviewed the Medicare Annual Wellness questionnaire and have noted 1. The patient's medical and social history 2. Their use of alcohol, tobacco or illicit drugs 3. Their current medications and supplements 4. The patient's functional ability including ADL's, fall risks, home safety risks and hearing or visual             impairment. 5. Diet and physical activities 6. Evidence for depression or mood disorders  The patients weight, height, BMI and visual acuity have been recorded in the chart I have made referrals, counseling and provided education to the patient based review of the above and I have provided the pt with a written personalized care plan for preventive services.  I have provided you with a copy of your personalized plan for preventive services. Please take the time to review along with your updated medication list.  Will look into shingrix Yearly flu vaccine Discussed increased exercise---including resistance Yearly mammograms for now--had one 2 months ago Colon due 2022

## 2017-12-04 NOTE — Assessment & Plan Note (Signed)
Mild mood issues but basically fine on the current regimen

## 2017-12-04 NOTE — Assessment & Plan Note (Signed)
No evidence of worsening Yearly follow up is due

## 2017-12-04 NOTE — Patient Instructions (Signed)
Ask your pharmacist about the shingrix vaccine.

## 2017-12-04 NOTE — Assessment & Plan Note (Signed)
Has really cut back on her estrogen Discussed trying to get off completely

## 2017-12-04 NOTE — Assessment & Plan Note (Signed)
BP Readings from Last 3 Encounters:  12/04/17 132/78  10/08/17 118/70  07/07/17 124/80   Good control No change needed

## 2017-12-04 NOTE — Progress Notes (Signed)
Hearing Screening   125Hz  250Hz  500Hz  1000Hz  2000Hz  3000Hz  4000Hz  6000Hz  8000Hz   Right ear:   25 25 25  25     Left ear:   25 40 25  25      Visual Acuity Screening   Right eye Left eye Both eyes  Without correction: 20/30 20/70 20/40   With correction:

## 2017-12-04 NOTE — Assessment & Plan Note (Signed)
Happy with the gabapentin

## 2017-12-04 NOTE — Progress Notes (Signed)
Subjective:    Patient ID: Regina Chapman, female    DOB: 1950-07-30, 67 y.o.   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 aspirin 81 MG tablet Take 81 mg by mouth 2 (two) times a week.     . Biotin 10000 MCG TABS Take 1 tablet by mouth daily.    Marland Kitchen buPROPion (WELLBUTRIN XL) 150 MG 24 hr tablet TAKE 1 TABLET(150 MG) BY MOUTH TWICE DAILY 90 tablet 0  . cholecalciferol (VITAMIN D) 1000 units tablet Take 1,000 Units by mouth daily.    Marland Kitchen estradiol (ESTRACE) 0.5 MG tablet Take 0.5 tablets (0.25 mg total) by mouth daily. 45 tablet 3  . gabapentin (NEURONTIN) 300 MG capsule TAKE 1 TO 2 CAPSULES(300 TO 600 MG) BY MOUTH THREE TIMES DAILY 60 capsule 1  . hydrochlorothiazide (HYDRODIURIL) 25 MG tablet Take 1 tablet (25 mg total) by mouth daily. 90 tablet 3  . losartan (COZAAR) 100 MG tablet Take 1 tablet (100 mg total) by mouth daily. 90 tablet 3  . Omega 3 1000 MG CAPS Take 2 capsules by mouth daily.    . potassium citrate (UROCIT-K) 10 MEQ (1080 MG) SR tablet Take 20 mEq by mouth 2 (two) times daily.    .  traZODone (DESYREL) 50 MG tablet TAKE 1 TO 2 TABLETS(50 TO 100 MG) BY MOUTH AT BEDTIME AS NEEDED 180 tablet 3   No current facility-administered medications on file prior to visit.     Allergies  Allergen Reactions  . Augmentin [Amoxicillin-Pot Clavulanate] Nausea And Vomiting    Able to tolerate Amoxil  . Cortisone Other (See Comments)    Pt had depomedrol inj and next day had red face,puffiness in face and slight swelling in face; no SOB or swelling in mouth,tongue or throat.    Past Medical History:  Diagnosis Date  . Cancer ([place])    Melanoma  . 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 Depression   . History of melanoma 1985  . Hyperlipidemia   . Hypertension   . Kidney stones   . Obesity   . Obesity   . Psoriasis     Past Surgical History:  Procedure Laterality Date  . ABDOMINAL HYSTERECTOMY    . CATARACT EXTRACTION W/ INTRAOCULAR LENS  IMPLANT, BILATERAL  2013  . COLOSTOMY  2007  . LAPAROSCOPIC ASSISTED VAGINAL HYSTERECTOMY  1987   FOR ENDOMETRIOSIS  . LUNG REMOVAL, PARTIAL    . MELANOMA EXCISION  1985  . OOPHORECTOMY      Family History  Problem Relation Age of Onset  . Cervical cancer Sister  CERVICAL   . Heart disease Mother   . Dementia Mother   . Breast cancer Other   . Diabetes Neg Hx   . Hypertension Neg Hx   . Colon cancer Neg Hx     Social History   Socioeconomic History  . Marital status: Widowed    Spouse name: Not on file  . Number of children: 1  . Years of education: Not on file  . Highest education level: Not on file  Occupational History  . Occupation: Geophysicist/field seismologist    Comment: Retired 2013  Social Needs  . Financial resource strain: Not on file  . Food insecurity:    Worry: Not on file    Inability: Not on file  . Transportation needs:    Medical: Not on file    Non-medical: Not on file  Tobacco Use  . Smoking status: Never Smoker  . Smokeless tobacco: Never Used  Substance and Sexual Activity  .  Alcohol use: Yes    Alcohol/week: 3.0 standard drinks    Types: 3 Glasses of wine per week    Comment: 3 PER WEEK  . Drug use: No  . Sexual activity: Never    Birth control/protection: Surgical  Lifestyle  . Physical activity:    Days per week: Not on file    Minutes per session: Not on file  . Stress: Not on file  Relationships  . Social connections:    Talks on phone: Not on file    Gets together: Not on file    Attends religious service: Not on file    Active member of club or organization: Not on file    Attends meetings of clubs or organizations: Not on file    Relationship status: Not on file  . Intimate partner violence:    Fear of current or ex partner: Not on file    Emotionally abused: Not on file    Physically abused: Not on file    Forced sexual activity: Not on file  Other Topics Concern  . Not on file  Social History Narrative   No living will or health care POA   Would want son to make decisions   Would accept resuscitation but no prolonged ventilation   No tube feeds if cognitively unaware   Review of Systems Appetite is good Weight is stable--trying to be careful Wears seat belt Teeth okay--sees dentist Psoriasis ---extract laser Rx at dermatologist. This is helping Bowels are slow--usually takes miralax. No blood Voids okay Some back pain--- may be from household chores/lifting No heartburn or dysphagia    Objective:   Physical Exam  Constitutional: She is oriented to person, place, and time. She appears well-developed. No distress.  HENT:  Mouth/Throat: Oropharynx is clear and moist. No oropharyngeal exudate.  Neck: No thyromegaly present.  Cardiovascular: Normal rate, regular rhythm, normal heart sounds and intact distal pulses. Exam reveals no gallop.  No murmur heard. Respiratory: Effort normal and breath sounds normal. No respiratory distress. She has no wheezes. She has no rales.  GI: Soft. There is no tenderness.  Musculoskeletal: She  exhibits no edema or tenderness.  Lymphadenopathy:    She has no cervical adenopathy.  Neurological: She is alert and oriented to person, place, and time.  President--- "Dwaine Deter, [place]" [phone] D-l-r-o-w Recall 3/3  Skin: No rash noted. No erythema.  Psychiatric: She has a normal mood and affect. Her behavior is normal.  Assessment & Plan:

## 2017-12-07 DIAGNOSIS — L400 Psoriasis vulgaris: Secondary | ICD-10-CM | POA: Diagnosis not present

## 2017-12-14 DIAGNOSIS — L400 Psoriasis vulgaris: Secondary | ICD-10-CM | POA: Diagnosis not present

## 2017-12-17 DIAGNOSIS — L400 Psoriasis vulgaris: Secondary | ICD-10-CM | POA: Diagnosis not present

## 2017-12-21 DIAGNOSIS — L400 Psoriasis vulgaris: Secondary | ICD-10-CM | POA: Diagnosis not present

## 2017-12-24 DIAGNOSIS — L400 Psoriasis vulgaris: Secondary | ICD-10-CM | POA: Diagnosis not present

## 2017-12-29 DIAGNOSIS — L400 Psoriasis vulgaris: Secondary | ICD-10-CM | POA: Diagnosis not present

## 2017-12-31 DIAGNOSIS — L400 Psoriasis vulgaris: Secondary | ICD-10-CM | POA: Diagnosis not present

## 2018-01-18 ENCOUNTER — Encounter: Payer: Medicare Other | Admitting: Internal Medicine

## 2018-02-08 ENCOUNTER — Other Ambulatory Visit: Payer: Self-pay | Admitting: Internal Medicine

## 2018-02-16 DIAGNOSIS — L400 Psoriasis vulgaris: Secondary | ICD-10-CM | POA: Diagnosis not present

## 2018-02-16 DIAGNOSIS — L649 Androgenic alopecia, unspecified: Secondary | ICD-10-CM | POA: Diagnosis not present

## 2018-02-22 DIAGNOSIS — Z23 Encounter for immunization: Secondary | ICD-10-CM | POA: Diagnosis not present

## 2018-04-13 ENCOUNTER — Ambulatory Visit (INDEPENDENT_AMBULATORY_CARE_PROVIDER_SITE_OTHER): Payer: Medicare Other | Admitting: Family Medicine

## 2018-04-13 ENCOUNTER — Encounter: Payer: Self-pay | Admitting: Family Medicine

## 2018-04-13 VITALS — BP 120/82 | HR 77 | Temp 98.4°F | Ht 61.5 in | Wt 187.0 lb

## 2018-04-13 DIAGNOSIS — R05 Cough: Secondary | ICD-10-CM

## 2018-04-13 DIAGNOSIS — R0982 Postnasal drip: Secondary | ICD-10-CM

## 2018-04-13 DIAGNOSIS — R0981 Nasal congestion: Secondary | ICD-10-CM

## 2018-04-13 DIAGNOSIS — J019 Acute sinusitis, unspecified: Secondary | ICD-10-CM | POA: Diagnosis not present

## 2018-04-13 DIAGNOSIS — R059 Cough, unspecified: Secondary | ICD-10-CM

## 2018-04-13 MED ORDER — BENZONATATE 100 MG PO CAPS: 100.0000 mg | ORAL_CAPSULE | Freq: Three times a day (TID) | ORAL | 0 refills | Status: DC | PRN

## 2018-04-13 MED ORDER — LORATADINE 10 MG PO TABS: 10.0000 mg | ORAL_TABLET | Freq: Every day | ORAL | 5 refills | Status: DC

## 2018-04-13 MED ORDER — DOXYCYCLINE HYCLATE 100 MG PO TABS: 100.0000 mg | ORAL_TABLET | Freq: Two times a day (BID) | ORAL | 0 refills | Status: DC

## 2018-04-13 NOTE — Progress Notes (Signed)
   Subjective:    Patient ID: Regina Chapman, female    DOB: 04/05/1951, 67 y.o.   MRN: [id number]  HPI  Presents to clinic c/o sinus pressure, drainage, sinus headache, cough, feeling run down for 2-3 weeks.  Currently does not take any sort of allergy medicine.  States when she blows her nose it is thick and yellow.  Denies fever or chills.  Denies any shortness of breath or wheezing.  Denies nausea, vomiting or diarrhea  Patient Active Problem List   Diagnosis Date Noted  . Acute non-recurrent pansinusitis 10/08/2017  . Neuropathy 09/26/2016  . Postmenopausal estrogen deficiency 07/20/2015  . Advance directive discussed with patient 07/20/2015  . 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 Obesity   . Routine general medical examination at a health care facility 02/09/2013  . Hyperlipidemia   . Hypertension   . Recurrent major depression in remission ([place]) 09/16/2008   Social History   Tobacco Use  . Smoking status: Never Smoker  . Smokeless tobacco: Never Used  Substance Use Topics  . Alcohol use: Yes    Alcohol/week: 3.0 standard drinks    Types: 3 Glasses of wine per week    Comment: 3 PER WEEK   Review of Systems  Constitutional: Negative for chills, fatigue and fever.  HENT: +congestion. +sinus pain. +sinus drainage.    Eyes: Negative.   Respiratory: +cough. No SOB or wheeze Cardiovascular: Negative for chest pain, palpitations and leg swelling.  Gastrointestinal: Negative for abdominal pain, diarrhea, nausea and vomiting.  Genitourinary: Negative for dysuria, frequency and urgency.  Musculoskeletal: Negative for arthralgias and myalgias.  Skin: Negative for color change, pallor and rash.  Neurological: Negative for syncope, light-headedness and headaches.  Psychiatric/Behavioral: The patient is not nervous/anxious.       Objective:   Physical Exam  Constitutional: She appears well-developed and well-nourished. No distress.  HENT:  Head: Normocephalic and  atraumatic. Nose/throat: Positive thick yellow nasal discharge.  Positive postnasal drip.  Positive postnasal drip.  Positive maxillary and facial sinus tenderness Eyes: EOM are normal. No scleral icterus.  Neck: Normal range of motion. Neck supple. No tracheal deviation present.  Cardiovascular: Normal rate, regular rhythm and normal heart sounds.  Pulmonary/Chest: Effort normal and breath sounds normal. No respiratory distress. She has no wheezes. She has no rales.  Neurological: She is alert and oriented to person, place, and time.  Gait normal  Skin: Skin is warm and dry. No pallor.  Psychiatric: She has a normal mood and affect. Her behavior is normal.   Nursing note and vitals reviewed.    Vitals:   04/13/18 1541  BP: 120/82  Pulse: 77  Temp: 98.4 F (36.9 C)  SpO2: 94%   Assessment & Plan:   Acute sinusitis- patient will take doxycycline twice daily for 10 days.  Doxycycline was chosen due to patient reported allergy to Augmentin.  She will rest, increase fluids and do good handwashing.  Sinus congestion/postnasal drip/cough - patient will begin taking a daily allergy medicine, Claritin sent to pharmacy.  Lungs are clear on exam so I suspect cough related to postnasal drip.  She will use Tessalon Perles as needed.  Also advised to do saline nasal flushes to help rinse out sinus congestion.  Patient will keep regularly scheduled follow-up with PCP as planned.  Advised to return to clinic sooner if any issues arise.

## 2018-04-13 NOTE — Patient Instructions (Signed)

## 2018-04-14 ENCOUNTER — Encounter: Payer: Self-pay | Admitting: Family Medicine

## 2018-04-28 DIAGNOSIS — M5432 Sciatica, left side: Secondary | ICD-10-CM

## 2018-04-28 HISTORY — DX: Sciatica, left side: M54.32

## 2018-05-13 DIAGNOSIS — D3A090 Benign carcinoid tumor of the bronchus and lung: Secondary | ICD-10-CM | POA: Diagnosis not present

## 2018-06-10 DIAGNOSIS — K8020 Calculus of gallbladder without cholecystitis without obstruction: Secondary | ICD-10-CM | POA: Diagnosis not present

## 2018-06-10 DIAGNOSIS — C3482 Malignant neoplasm of overlapping sites of left bronchus and lung: Secondary | ICD-10-CM | POA: Diagnosis not present

## 2018-06-10 DIAGNOSIS — E041 Nontoxic single thyroid nodule: Secondary | ICD-10-CM | POA: Diagnosis not present

## 2018-06-10 DIAGNOSIS — D3A090 Benign carcinoid tumor of the bronchus and lung: Secondary | ICD-10-CM | POA: Diagnosis not present

## 2018-06-10 DIAGNOSIS — R918 Other nonspecific abnormal finding of lung field: Secondary | ICD-10-CM | POA: Diagnosis not present

## 2018-06-10 DIAGNOSIS — Z85820 Personal history of malignant melanoma of skin: Secondary | ICD-10-CM | POA: Diagnosis not present

## 2018-06-21 ENCOUNTER — Telehealth: Payer: Self-pay

## 2018-06-21 NOTE — Telephone Encounter (Signed)
Left message to call office.  [facility] has the ER 150 for $15 a month or $38 for 90 days. Not sure what she was paying for it or how much the SR will cost her.

## 2018-06-21 NOTE — Telephone Encounter (Signed)
I didn't think that it was too expensive so I am surprised. The best thing probably would be for her to change to SR ---can probably just try in the morning and let me know if it seems to wear off Otherwise can just use regular bupropion 75mg  twice a day

## 2018-06-21 NOTE — Telephone Encounter (Signed)
Received PA request for bupropion ER 150. Insurance will not cover it, but will cover bupropion SR, citalopram, escitalopram, fluoxetine, imipramine, mirtazapine, nortriptyline, paroxetine, sertraline, or venlafaxine. I did not find any of these in her med list history.  Questionnaire at my desk if PA is to be done.

## 2018-06-22 NOTE — Telephone Encounter (Signed)
Pt returned your call. She picked up 30 day supply for $71 at [facility]. Ins has a $365 deductible for tier 3,4 &5.

## 2018-06-22 NOTE — Telephone Encounter (Signed)
Spoke to pt. She wants to try the bupropion SR.

## 2018-06-23 MED ORDER — BUPROPION HCL ER (SR) 150 MG PO TB12: 150.0000 mg | ORAL_TABLET | Freq: Every day | ORAL | 1 refills | Status: DC

## 2018-06-23 NOTE — Telephone Encounter (Signed)
Okay to send Rx for 1 year Bupropion SR 150mg  May want to start with just 30 to see how she does It is possible we may need to add a second dose---usually in early afternoon if needed

## 2018-06-23 NOTE — Telephone Encounter (Signed)
Spoke to pt. We did a 2 moth rx first to make sure she does okay with it. She will update [place] as needed. We will add to the quantity if she needs to increase the dosage.

## 2018-06-23 NOTE — Addendum Note (Signed)
Addended by: Pilar Grammes on: 06/23/2018 08:46 AM   Modules accepted: Orders

## 2018-07-02 DIAGNOSIS — Z85820 Personal history of malignant melanoma of skin: Secondary | ICD-10-CM | POA: Diagnosis not present

## 2018-07-02 DIAGNOSIS — D3A090 Benign carcinoid tumor of the bronchus and lung: Secondary | ICD-10-CM | POA: Diagnosis not present

## 2018-07-02 DIAGNOSIS — N200 Calculus of kidney: Secondary | ICD-10-CM | POA: Diagnosis not present

## 2018-07-02 DIAGNOSIS — K8020 Calculus of gallbladder without cholecystitis without obstruction: Secondary | ICD-10-CM | POA: Diagnosis not present

## 2018-07-02 DIAGNOSIS — R918 Other nonspecific abnormal finding of lung field: Secondary | ICD-10-CM | POA: Diagnosis not present

## 2018-07-07 DIAGNOSIS — Z79899 Other long term (current) drug therapy: Secondary | ICD-10-CM | POA: Diagnosis not present

## 2018-07-07 DIAGNOSIS — K3189 Other diseases of stomach and duodenum: Secondary | ICD-10-CM | POA: Diagnosis not present

## 2018-07-07 DIAGNOSIS — R933 Abnormal findings on diagnostic imaging of other parts of digestive tract: Secondary | ICD-10-CM | POA: Diagnosis not present

## 2018-07-07 DIAGNOSIS — D3A090 Benign carcinoid tumor of the bronchus and lung: Secondary | ICD-10-CM | POA: Diagnosis not present

## 2018-07-07 DIAGNOSIS — I10 Essential (primary) hypertension: Secondary | ICD-10-CM | POA: Diagnosis not present

## 2018-07-07 DIAGNOSIS — N189 Chronic kidney disease, unspecified: Secondary | ICD-10-CM | POA: Diagnosis not present

## 2018-07-07 DIAGNOSIS — I129 Hypertensive chronic kidney disease with stage 1 through stage 4 chronic kidney disease, or unspecified chronic kidney disease: Secondary | ICD-10-CM | POA: Diagnosis not present

## 2018-07-07 DIAGNOSIS — D3A8 Other benign neuroendocrine tumors: Secondary | ICD-10-CM | POA: Diagnosis not present

## 2018-07-16 DIAGNOSIS — D3A098 Benign carcinoid tumors of other sites: Secondary | ICD-10-CM | POA: Diagnosis not present

## 2018-08-06 DIAGNOSIS — G939 Disorder of brain, unspecified: Secondary | ICD-10-CM | POA: Diagnosis not present

## 2018-08-06 DIAGNOSIS — C7A090 Malignant carcinoid tumor of the bronchus and lung: Secondary | ICD-10-CM | POA: Diagnosis not present

## 2018-08-06 DIAGNOSIS — R918 Other nonspecific abnormal finding of lung field: Secondary | ICD-10-CM | POA: Diagnosis not present

## 2018-08-06 DIAGNOSIS — Z85820 Personal history of malignant melanoma of skin: Secondary | ICD-10-CM | POA: Diagnosis not present

## 2018-08-06 DIAGNOSIS — C7B03 Secondary carcinoid tumors of bone: Secondary | ICD-10-CM | POA: Diagnosis not present

## 2018-08-06 DIAGNOSIS — C7951 Secondary malignant neoplasm of bone: Secondary | ICD-10-CM | POA: Diagnosis not present

## 2018-08-06 DIAGNOSIS — G9389 Other specified disorders of brain: Secondary | ICD-10-CM | POA: Diagnosis not present

## 2018-08-06 DIAGNOSIS — D3A090 Benign carcinoid tumor of the bronchus and lung: Secondary | ICD-10-CM | POA: Diagnosis not present

## 2018-08-06 DIAGNOSIS — D3A8 Other benign neuroendocrine tumors: Secondary | ICD-10-CM | POA: Diagnosis not present

## 2018-08-12 DIAGNOSIS — D3A098 Benign carcinoid tumors of other sites: Secondary | ICD-10-CM | POA: Diagnosis not present

## 2018-08-16 ENCOUNTER — Other Ambulatory Visit: Payer: Self-pay | Admitting: *Deleted

## 2018-08-16 MED ORDER — BUPROPION HCL ER (SR) 150 MG PO TB12: 150.0000 mg | ORAL_TABLET | Freq: Every day | ORAL | 1 refills | Status: DC

## 2018-08-16 NOTE — Telephone Encounter (Signed)
Patient left a voicemail stating that she needs a new script for her Bupropion.

## 2018-08-16 NOTE — Telephone Encounter (Signed)
Rx sent in

## 2018-09-02 ENCOUNTER — Other Ambulatory Visit: Payer: Self-pay | Admitting: Internal Medicine

## 2018-09-05 ENCOUNTER — Other Ambulatory Visit: Payer: Self-pay | Admitting: Internal Medicine

## 2018-09-06 DIAGNOSIS — C7A8 Other malignant neuroendocrine tumors: Secondary | ICD-10-CM | POA: Diagnosis not present

## 2018-09-06 DIAGNOSIS — K521 Toxic gastroenteritis and colitis: Secondary | ICD-10-CM | POA: Diagnosis not present

## 2018-09-06 DIAGNOSIS — C7B8 Other secondary neuroendocrine tumors: Secondary | ICD-10-CM | POA: Diagnosis not present

## 2018-09-06 DIAGNOSIS — D4120 Neoplasm of uncertain behavior of unspecified ureter: Secondary | ICD-10-CM | POA: Diagnosis not present

## 2018-09-06 DIAGNOSIS — D4100 Neoplasm of uncertain behavior of unspecified kidney: Secondary | ICD-10-CM | POA: Diagnosis not present

## 2018-09-06 DIAGNOSIS — T38995A Adverse effect of other hormone antagonists, initial encounter: Secondary | ICD-10-CM | POA: Diagnosis not present

## 2018-09-27 DIAGNOSIS — N200 Calculus of kidney: Secondary | ICD-10-CM | POA: Diagnosis not present

## 2018-10-04 DIAGNOSIS — C7A8 Other malignant neuroendocrine tumors: Secondary | ICD-10-CM | POA: Diagnosis not present

## 2018-10-04 DIAGNOSIS — R110 Nausea: Secondary | ICD-10-CM | POA: Diagnosis not present

## 2018-10-04 DIAGNOSIS — D3A098 Benign carcinoid tumors of other sites: Secondary | ICD-10-CM | POA: Diagnosis not present

## 2018-10-04 DIAGNOSIS — C7B8 Other secondary neuroendocrine tumors: Secondary | ICD-10-CM | POA: Diagnosis not present

## 2018-10-04 DIAGNOSIS — Z79899 Other long term (current) drug therapy: Secondary | ICD-10-CM | POA: Diagnosis not present

## 2018-10-13 ENCOUNTER — Other Ambulatory Visit: Payer: Self-pay | Admitting: Internal Medicine

## 2018-11-01 DIAGNOSIS — K449 Diaphragmatic hernia without obstruction or gangrene: Secondary | ICD-10-CM | POA: Diagnosis not present

## 2018-11-01 DIAGNOSIS — D4100 Neoplasm of uncertain behavior of unspecified kidney: Secondary | ICD-10-CM | POA: Diagnosis not present

## 2018-11-01 DIAGNOSIS — D4120 Neoplasm of uncertain behavior of unspecified ureter: Secondary | ICD-10-CM | POA: Diagnosis not present

## 2018-11-01 DIAGNOSIS — R918 Other nonspecific abnormal finding of lung field: Secondary | ICD-10-CM | POA: Diagnosis not present

## 2018-11-01 DIAGNOSIS — E236 Other disorders of pituitary gland: Secondary | ICD-10-CM | POA: Diagnosis not present

## 2018-11-01 DIAGNOSIS — D3A098 Benign carcinoid tumors of other sites: Secondary | ICD-10-CM | POA: Diagnosis not present

## 2018-11-01 DIAGNOSIS — C7A8 Other malignant neuroendocrine tumors: Secondary | ICD-10-CM | POA: Diagnosis not present

## 2018-11-01 DIAGNOSIS — C7B8 Other secondary neuroendocrine tumors: Secondary | ICD-10-CM | POA: Diagnosis not present

## 2018-11-01 DIAGNOSIS — Z902 Acquired absence of lung [part of]: Secondary | ICD-10-CM | POA: Diagnosis not present

## 2018-11-01 DIAGNOSIS — C801 Malignant (primary) neoplasm, unspecified: Secondary | ICD-10-CM | POA: Diagnosis not present

## 2018-11-01 DIAGNOSIS — I2584 Coronary atherosclerosis due to calcified coronary lesion: Secondary | ICD-10-CM | POA: Diagnosis not present

## 2018-11-03 DIAGNOSIS — N200 Calculus of kidney: Secondary | ICD-10-CM | POA: Diagnosis not present

## 2018-11-12 ENCOUNTER — Other Ambulatory Visit: Payer: Self-pay | Admitting: Internal Medicine

## 2018-11-12 ENCOUNTER — Telehealth: Payer: Self-pay | Admitting: Internal Medicine

## 2018-11-12 DIAGNOSIS — Z1231 Encounter for screening mammogram for malignant neoplasm of breast: Secondary | ICD-10-CM

## 2018-11-12 DIAGNOSIS — Z1239 Encounter for other screening for malignant neoplasm of breast: Secondary | ICD-10-CM

## 2018-11-12 NOTE — Telephone Encounter (Signed)
I have tried to put in the order---and it won't allow the order with diagnosis of screening for breast cancer or encounter for. Please figure out how to get the order in and associated properly

## 2018-11-12 NOTE — Telephone Encounter (Signed)
Patient stated that she tried to schedule her mammogram and they are requesting an order be sent to them before they can schedule  [place] @ [facility]    Patient C/B # [id number]

## 2018-11-15 ENCOUNTER — Other Ambulatory Visit: Payer: Self-pay | Admitting: Internal Medicine

## 2018-11-15 DIAGNOSIS — Z1231 Encounter for screening mammogram for malignant neoplasm of breast: Secondary | ICD-10-CM

## 2018-11-19 NOTE — Telephone Encounter (Signed)
Called and spoke to [place]. The pt is scheduled already and they have the order in.

## 2018-11-30 ENCOUNTER — Other Ambulatory Visit: Payer: Self-pay | Admitting: Internal Medicine

## 2018-11-30 DIAGNOSIS — D3A098 Benign carcinoid tumors of other sites: Secondary | ICD-10-CM | POA: Diagnosis not present

## 2018-12-07 ENCOUNTER — Other Ambulatory Visit: Payer: Self-pay

## 2018-12-07 ENCOUNTER — Encounter: Payer: Self-pay | Admitting: Internal Medicine

## 2018-12-07 ENCOUNTER — Ambulatory Visit (INDEPENDENT_AMBULATORY_CARE_PROVIDER_SITE_OTHER): Payer: Medicare Other | Admitting: Internal Medicine

## 2018-12-07 DIAGNOSIS — I10 Essential (primary) hypertension: Secondary | ICD-10-CM | POA: Diagnosis not present

## 2018-12-07 DIAGNOSIS — Z0000 Encounter for general adult medical examination without abnormal findings: Secondary | ICD-10-CM

## 2018-12-07 DIAGNOSIS — N200 Calculus of kidney: Secondary | ICD-10-CM

## 2018-12-07 DIAGNOSIS — F3341 Major depressive disorder, recurrent, in partial remission: Secondary | ICD-10-CM | POA: Diagnosis not present

## 2018-12-07 DIAGNOSIS — C7B00 Secondary carcinoid tumors, unspecified site: Secondary | ICD-10-CM | POA: Diagnosis not present

## 2018-12-07 DIAGNOSIS — Z7189 Other specified counseling: Secondary | ICD-10-CM | POA: Diagnosis not present

## 2018-12-07 MED ORDER — BUPROPION HCL ER (SR) 150 MG PO TB12: 150.0000 mg | ORAL_TABLET | Freq: Two times a day (BID) | ORAL | 3 refills | Status: DC

## 2018-12-07 NOTE — Progress Notes (Signed)
Subjective:    Patient ID: Regina Chapman, female    DOB: [birth year], 68 y.o.   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 Monthly lanreotide Awaiting follow up brain scan later this month  Insurance made her change to SR bupropion Not working as well Not sure if it is from the other things going on--but then notes she is only taking it once a day  No recurrent symptomatic kidney stones No change in burden now Has to change urocit-K---- due to cost.  They will recheck labs soon  No problems with BP meds She monitors it regularly--running fine (typically 120-140/70-80) No chest pain No palpitations Very brief dizziness at times---no syncope No edema  Did wean off the estrogen completely  Current Outpatient Medications on File Prior to Visit  Medication Sig Dispense Refill  . aspirin 81 MG tablet Take 81 mg by mouth 2 (two) times a week.     . Biotin 10000 MCG TABS Take 1 tablet by mouth daily.    Marland Kitchen buPROPion (WELLBUTRIN SR) 150 MG 12 hr tablet TAKE 1 TABLET(150 MG) BY MOUTH DAILY 30 tablet 11  . cholecalciferol (VITAMIN D) 1000 units tablet Take 1,000 Units by mouth daily.    . hydrochlorothiazide (HYDRODIURIL) 25 MG tablet TAKE 1 TABLET(25 MG) BY MOUTH DAILY 90 tablet 1  . loratadine (CLARITIN) 10 MG tablet Take 1 tablet (10 mg total) by mouth daily. 30 tablet 5  . losartan (COZAAR) 100 MG tablet TAKE 1 TABLET(100 MG) BY MOUTH DAILY 90 tablet 3   . Omega 3 1000 MG CAPS Take 2 capsules by mouth daily.    . potassium citrate (UROCIT-K) 10 MEQ (1080 MG) SR tablet Take 20 mEq by mouth 2 (two) times daily.    . traZODone (DESYREL) 50 MG tablet TAKE 1 TO 2 TABLETS(50 TO 100 MG) BY MOUTH AT BEDTIME AS NEEDED 180 tablet 3   No current facility-administered medications on file prior to visit.     Allergies  Allergen Reactions  . Augmentin [Amoxicillin-Pot Clavulanate] Nausea And Vomiting    Able to tolerate Amoxil  . Cortisone Other (See Comments)    Pt had depomedrol inj and next day had red face,puffiness in face and slight swelling in face; no SOB or swelling in mouth,tongue or throat.    Past Medical History:  Diagnosis Date  . Cancer ([place])    Melanoma  . 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 Depression   . History of melanoma 1985  . Hyperlipidemia   . Hypertension   . Kidney stones   . Obesity   . Obesity   . Psoriasis     Past Surgical History:  Procedure Laterality Date  . ABDOMINAL HYSTERECTOMY    . CATARACT EXTRACTION W/ INTRAOCULAR LENS  IMPLANT, BILATERAL  2013  . COLOSTOMY  2007  . LAPAROSCOPIC ASSISTED VAGINAL HYSTERECTOMY  1987   FOR ENDOMETRIOSIS  . LUNG REMOVAL, PARTIAL    . MELANOMA EXCISION  1985  . OOPHORECTOMY  Family History  Problem Relation Age of Onset  . Cervical cancer Sister        CERVICAL   . Heart disease Mother   . Dementia Mother   . Breast cancer Other   . Diabetes Neg Hx   . Hypertension Neg Hx   . Colon cancer Neg Hx     Social History   Socioeconomic History  . Marital status: Widowed    Spouse name: Not on file  . Number of children: 1  . Years of education: Not on file  . Highest education level: Not on file  Occupational History  . Occupation: Geophysicist/field seismologist    Comment: Retired 2013  Social Needs  . Financial resource strain: Not on file  . Food insecurity    Worry: Not on file    Inability: Not on file  . Transportation needs    Medical:  Not on file    Non-medical: Not on file  Tobacco Use  . Smoking status: Never Smoker  . Smokeless tobacco: Never Used  Substance and Sexual Activity  . Alcohol use: Yes    Alcohol/week: 3.0 standard drinks    Types: 3 Glasses of wine per week    Comment: 3 PER WEEK  . Drug use: No  . Sexual activity: Never    Birth control/protection: Surgical  Lifestyle  . Physical activity    Days per week: Not on file    Minutes per session: Not on file  . Stress: Not on file  Relationships  . Social Herbalist on phone: Not on file    Gets together: Not on file    Attends religious service: Not on file    Active member of club or organization: Not on file    Attends meetings of clubs or organizations: Not on file    Relationship status: Not on file  . Intimate partner violence    Fear of current or ex partner: Not on file    Emotionally abused: Not on file    Physically abused: Not on file    Forced sexual activity: Not on file  Other Topics Concern  . Not on file  Social History Narrative   No living will or health care POA   Would want son to make decisions   Would accept resuscitation but no prolonged ventilation   No tube feeds if cognitively unaware   Review of Systems Occasional mild headache--side effect of lanreotide probably Appetite is fine Weight is down a few pounds Sleeps fine Wears seat belt Teeth are fine--keeps up with dentist Bowels are fine---some diarrhea after the injection. No blood No urinary symptoms. No incontinent No suspicious skin lesions. Chronic psoriasis---has cream Some joint pains--may be from the injections also No heartburn or dysphagia    Objective:   Physical Exam  Constitutional: She is oriented to person, place, and time. She appears well-developed. No distress.  Respiratory: Effort normal. No respiratory distress.  Neurological: She is alert and oriented to person, place, and time.  President-- "Dwaine Deter, [place]"  [phone] D-l-r-o-w Recall 3/3  Psychiatric: She has a normal mood and affect. Her behavior is normal.           Assessment & Plan:

## 2018-12-07 NOTE — Assessment & Plan Note (Signed)
Not doing as well on daily SR bupropion Will increase to bid She will let me know if ongoing problems

## 2018-12-07 NOTE — Assessment & Plan Note (Signed)
See social history 

## 2018-12-07 NOTE — Assessment & Plan Note (Signed)
I have personally reviewed the Medicare Annual Wellness questionnaire and have noted 1. The patient's medical and social history 2. Their use of alcohol, tobacco or illicit drugs 3. Their current medications and supplements 4. The patient's functional ability including ADL's, fall risks, home safety risks and hearing or visual             impairment. 5. Diet and physical activities 6. Evidence for depression or mood disorders  The patients weight, height, BMI and visual acuity have been recorded in the chart I have made referrals, counseling and provided education to the patient based review of the above and I have provided the pt with a written personalized care plan for preventive services.  I have provided you with a copy of your personalized plan for preventive services. Please take the time to review along with your updated medication list.  Discussed shingrix--may be limited by cost Flu vaccine in 1-2 years Mammogram is scheduled---will cut back to every other year since off estrogen Colon due 2022 Discussed adding resistance training

## 2018-12-07 NOTE — Assessment & Plan Note (Signed)
Now on monthly lanreotide at [facility]

## 2018-12-07 NOTE — Assessment & Plan Note (Signed)
BP Readings from Last 3 Encounters:  12/07/18 138/82  04/13/18 120/82  12/04/17 132/78   Good control Recent labs at [facility] okay

## 2018-12-07 NOTE — Progress Notes (Signed)
Hearing Screening   125Hz  250Hz  500Hz  1000Hz  2000Hz  3000Hz  4000Hz  6000Hz  8000Hz   Right ear:           Left ear:           Comments: Not checked in the last 12 months. Does not have hearing aids.  Vision Screening Comments: Not checked in the last 12 months

## 2018-12-07 NOTE — Assessment & Plan Note (Signed)
Urologist changing potassium citrate No symptoms

## 2018-12-24 ENCOUNTER — Ambulatory Visit
Admission: RE | Admit: 2018-12-24 | Discharge: 2018-12-24 | Disposition: A | Discharge status: Home / Self Care | Payer: Medicare Other | Source: Ambulatory Visit | Attending: Internal Medicine | Admitting: Internal Medicine

## 2018-12-24 DIAGNOSIS — Z1231 Encounter for screening mammogram for malignant neoplasm of breast: Secondary | ICD-10-CM | POA: Insufficient documentation

## 2018-12-27 DIAGNOSIS — E237 Disorder of pituitary gland, unspecified: Secondary | ICD-10-CM | POA: Diagnosis not present

## 2018-12-27 DIAGNOSIS — C7B09 Secondary carcinoid tumors of other sites: Secondary | ICD-10-CM | POA: Diagnosis not present

## 2018-12-27 DIAGNOSIS — C7B02 Secondary carcinoid tumors of liver: Secondary | ICD-10-CM | POA: Diagnosis not present

## 2018-12-27 DIAGNOSIS — C7A8 Other malignant neuroendocrine tumors: Secondary | ICD-10-CM | POA: Diagnosis not present

## 2018-12-27 DIAGNOSIS — C7A090 Malignant carcinoid tumor of the bronchus and lung: Secondary | ICD-10-CM | POA: Diagnosis not present

## 2018-12-27 DIAGNOSIS — C7B01 Secondary carcinoid tumors of distant lymph nodes: Secondary | ICD-10-CM | POA: Diagnosis not present

## 2018-12-27 DIAGNOSIS — Z9841 Cataract extraction status, right eye: Secondary | ICD-10-CM | POA: Diagnosis not present

## 2018-12-27 DIAGNOSIS — D3A098 Benign carcinoid tumors of other sites: Secondary | ICD-10-CM | POA: Diagnosis not present

## 2018-12-27 DIAGNOSIS — M852 Hyperostosis of skull: Secondary | ICD-10-CM | POA: Diagnosis not present

## 2018-12-27 DIAGNOSIS — Z9842 Cataract extraction status, left eye: Secondary | ICD-10-CM | POA: Diagnosis not present

## 2019-01-06 DIAGNOSIS — Z23 Encounter for immunization: Secondary | ICD-10-CM | POA: Diagnosis not present

## 2019-01-20 DIAGNOSIS — D3A098 Benign carcinoid tumors of other sites: Secondary | ICD-10-CM | POA: Diagnosis not present

## 2019-02-16 ENCOUNTER — Ambulatory Visit (INDEPENDENT_AMBULATORY_CARE_PROVIDER_SITE_OTHER): Payer: Medicare Other | Admitting: Family Medicine

## 2019-02-16 ENCOUNTER — Other Ambulatory Visit: Payer: Self-pay

## 2019-02-16 ENCOUNTER — Encounter: Payer: Self-pay | Admitting: Family Medicine

## 2019-02-16 VITALS — BP 132/70 | HR 86 | Temp 98.4°F | Ht 61.5 in | Wt 180.5 lb

## 2019-02-16 DIAGNOSIS — M5416 Radiculopathy, lumbar region: Secondary | ICD-10-CM

## 2019-02-16 MED ORDER — DICLOFENAC SODIUM 75 MG PO TBEC: 75.0000 mg | DELAYED_RELEASE_TABLET | Freq: Two times a day (BID) | ORAL | 0 refills | Status: DC

## 2019-02-16 MED ORDER — TIZANIDINE HCL 4 MG PO TABS: 2.0000 mg | ORAL_TABLET | Freq: Every evening | ORAL | 2 refills | Status: DC | PRN

## 2019-02-16 NOTE — Progress Notes (Addendum)
Regina Chapman T. Danaye Sobh, MD Primary Care and [place] at [facility] [place], [address] Phone: [phone]   FAX: [place] - 68 y.o. female   MRN [id number]   Date of Birth: 1950/05/16  Visit Date: 02/16/2019   PCP: [name], MD   Referred by: [name], MD  Chief Complaint  Patient presents with   Sciatica    Worse on Right Side   Subjective:   Regina Chapman is a 68 y.o. very pleasant female patient who presents with the following: Back Pain  ongoing for approximately: 10 d The patient has had back pain before, h/o spinal stenosis The back pain is localized into the lumbar spine area. They also describe R radiculopathy.  She is a very nice lady who I have seen before with some frozen shoulder and some rotator cuff tendinopathy and she presents today with some ongoing back pain and radicular symptoms.  She has never had any sort of intervention or operation for her spine.  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   10 d radicular symptoms on the right.  OTC oral meds.  Jelps with aunt and mother in law.  Does do a little yardwork . Years ago had spinal stenosis.   No bowel or bladder incontinence. No focal weakness. Prior interventions: none Physical therapy: No Chiropractic manipulations: No Acupuncture: No Osteopathic manipulation: No Heat or cold: Minimal effect  Past Medical History, Surgical History, Family History, Medications, Allergies have been reviewed and updated if relevant.  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   Depression    History of melanoma 1985   Hyperlipidemia    Hypertension    Kidney stones    Obesity    Obesity    Psoriasis     Past Surgical History:  Procedure Laterality Date   ABDOMINAL HYSTERECTOMY     CATARACT EXTRACTION W/ INTRAOCULAR LENS  IMPLANT, BILATERAL  2013   COLOSTOMY  2007   LAPAROSCOPIC ASSISTED VAGINAL HYSTERECTOMY  1987   FOR ENDOMETRIOSIS   LUNG REMOVAL, PARTIAL     MELANOMA EXCISION  1985   OOPHORECTOMY      Social History   Socioeconomic History   Marital status: Widowed    Spouse name: Not on file   Number of children: 1   Years of education: Not on file   Highest education level: Not on file  Occupational History   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  Scientist, product/process development strain: Not on file   Food insecurity    Worry: Not on file    Inability: Not on file   Transportation needs    Medical: Not on file    Non-medical: Not on file  Tobacco Use   Smoking status: Never Smoker   Smokeless tobacco: Never Used  Substance and Sexual Activity   Alcohol use: Yes  Alcohol/week: 3.0 standard drinks    Types: 3 Glasses of wine per week    Comment: 3 PER WEEK   Drug use: No   Sexual activity: Never    Birth control/protection: Surgical  Lifestyle   Physical activity    Days per week: Not on file    Minutes per session: Not on file   Stress: Not on file  Relationships   Social connections    Talks on phone: Not on file    Gets together: Not on file    Attends religious service: Not on file    Active member of club or organization: Not on file    Attends meetings of clubs or organizations: Not on file    Relationship status: Not on file   Intimate partner violence    Fear of current or ex partner: Not on file    Emotionally abused:  Not on file    Physically abused: Not on file    Forced sexual activity: Not on file  Other Topics Concern   Not on file  Social History Narrative   No living will or health care POA   Would want son to make decisions   Would accept resuscitation but no prolonged ventilation   No tube feeds if cognitively unaware    Family History  Problem Relation Age of Onset   Cervical cancer Sister        CERVICAL    Heart disease Mother    Dementia Mother    Breast cancer Other    Diabetes Neg Hx    Hypertension Neg Hx    Colon cancer Neg Hx     Allergies  Allergen Reactions   Augmentin [Amoxicillin-Pot Clavulanate] Nausea And Vomiting    Able to tolerate Amoxil   Cortisone Other (See Comments)    Pt had depomedrol inj and next day had red face,puffiness in face and slight swelling in face; no SOB or swelling in mouth,tongue or throat.    Medication list reviewed and updated in full in [place].  GEN: No fevers, chills. Nontoxic. Primarily MSK c/o today. MSK: Detailed in the HPI GI: tolerating PO intake without difficulty Neuro: As above  Otherwise the pertinent positives of the ROS are noted above.    Objective:   Blood pressure 132/70, pulse 86, temperature 98.4 F (36.9 C), temperature source Temporal, height 5' 1.5" (1.562 m), weight 180 lb 8 oz (81.9 kg), SpO2 98 %.  Gen: Well-developed,well-nourished,in no acute distress; alert,appropriate and cooperative throughout examination HEENT: Normocephalic and atraumatic without obvious abnormalities.  Ears, externally no deformities Pulm: Breathing comfortably in no respiratory distress Range of motion at  the waist: Flexion, rotation and lateral bending: Full flexion, rotation as well as extension.  No echymosis or edema Rises to examination table with no difficulty Gait: minimally antalgic  Inspection/Deformity: No abnormality Paraspinus T: Extremely tight, mild tenderness to palpation from L1-S1  bilaterally.  B Ankle Dorsiflexion (L5,4): 5/5 B Great Toe Dorsiflexion (L5,4): 5/5 Heel Walk (L5): WNL Toe Walk (S1): WNL Rise/Squat (L4): WNL, mild pain  SENSORY B Medial Foot (L4): WNL B Dorsum (L5): WNL B Lateral (S1): WNL Light Touch: WNL Pinprick: WNL  REFLEXES Knee (L4): 2+ Ankle (S1): 2+  B SLR, seated: neg B SLR, supine: neg B FABER: neg B Reverse FABER: neg B Greater Troch: NT B Log Roll: neg B Stork: NT B Sciatic Notch: NT  Radiology: No results found.  Assessment and Plan:     ICD-10-CM  1. Lumbar radiculopathy, acute  M54.16    Her exam is relatively normal.  I Minna start with some NSAIDs, muscle relaxants, and basic range of motion.  She does have a documented allergy to methylprednisolone at some point.  She is not entirely clear on the history.  If her symptoms persist, then I think that it would be reasonable to do some low-dose oral Decadron.  Typically the reaction from methylprednisolone is due to the fillers in the medication, so a challenge would in my opinion be appropriate with a different medication.  She tells me that she has had joint injections later that have not caused her any problems.  Anatomy reviewed. Conservative algorithms for acute back pain generally begin with the following: NSAIDS, Muscle Relaxants, Mild pain medication  Start with medications, core rehab, and progress from there following low back pain algorithm. No red flags are present.  Addendum, 1:20 PM 03/16/19  The patient is going to be referred for PT with ongoing symptoms.  Follow-up: No follow-ups on file.  Meds ordered this encounter  Medications   diclofenac (VOLTAREN) 75 MG EC tablet    Sig: Take 1 tablet (75 mg total) by mouth 2 (two) times daily.    Dispense:  60 tablet    Refill:  0   tiZANidine (ZANAFLEX) 4 MG tablet    Sig: Take 0.5-1 tablets (2-4 mg total) by mouth at bedtime as needed for muscle spasms.    Dispense:  30 tablet    Refill:  2    No orders of the defined types were placed in this encounter.   Signed,  Maud Deed. Jahliyah Trice, MD   Allergies as of 02/16/2019      Reactions   Augmentin [amoxicillin-pot Clavulanate] Nausea And Vomiting   Able to tolerate Amoxil   Cortisone Other (See Comments)   Pt had depomedrol inj and next day had red face,puffiness in face and slight swelling in face; no SOB or swelling in mouth,tongue or throat.      Medication List       Accurate as of February 16, 2019  3:09 PM. If you have any questions, ask your nurse or doctor.        aspirin 81 MG tablet Take 81 mg by mouth 2 (two) times a week.   Biotin 10000 MCG Tabs Take 1 tablet by mouth daily.   buPROPion 150 MG 12 hr tablet Commonly known as: WELLBUTRIN SR Take 1 tablet (150 mg total) by mouth 2 (two) times daily.   cholecalciferol 1000 units tablet Commonly known as: VITAMIN D Take 1,000 Units by mouth daily.   diclofenac 75 MG EC tablet Commonly known as: VOLTAREN Take 1 tablet (75 mg total) by mouth 2 (two) times daily. Started by: Owens Loffler, MD   hydrochlorothiazide 25 MG tablet Commonly known as: HYDRODIURIL TAKE 1 TABLET(25 MG) BY MOUTH DAILY   loratadine 10 MG tablet Commonly known as: CLARITIN Take 1 tablet (10 mg total) by mouth daily.   losartan 100 MG tablet Commonly known as: COZAAR TAKE 1 TABLET(100 MG) BY MOUTH DAILY   Omega 3 1000 MG Caps Take 2 capsules by mouth daily.   potassium citrate 10 MEQ (1080 MG) SR tablet Commonly known as: UROCIT-K Take 20 mEq by mouth 2 (two) times daily.   tiZANidine 4 MG tablet Commonly known as: ZANAFLEX Take 0.5-1 tablets (2-4 mg total) by mouth at bedtime as needed for muscle spasms. Started by: Owens Loffler, MD   traZODone 50 MG  tablet Commonly known as: DESYREL TAKE 1 TO 2 TABLETS(50 TO 100 MG) BY MOUTH AT BEDTIME AS NEEDED

## 2019-02-22 ENCOUNTER — Telehealth: Payer: Self-pay

## 2019-02-22 MED ORDER — DEXAMETHASONE 4 MG PO TABS: ORAL_TABLET | ORAL | 0 refills | Status: DC

## 2019-02-22 NOTE — Telephone Encounter (Signed)
Pt last seen on 02/16/19 for sciatica; pt said taking diclofenac 75 mg one tab bid and tizanidine 4mg  taking one tab at hs. Pain relief is minimal. Pain level during the day 4-5 in rt lower back with pain running down rt leg to thigh area and at night pain level is 10. Pt said Dr Lorelei Pont mentioned might do low dose of oral Decadron if not get improvement from pain. [facility].Please advise.

## 2019-02-22 NOTE — Telephone Encounter (Signed)
Ms. Plazola notified as instructed by telephone.  Patient states understanding.  Decadron prescription sent to [facility] in [place], as instructed by Dr. Lorelei Pont.

## 2019-02-22 NOTE — Telephone Encounter (Signed)
Have her d/c the diclofenac - reassure her this has only been 5 days.   Decadron 4 mg. One tablet po for 10 days. On first 2 days, take 2 mg (cut in half). #9.   Let her know if she has any rashes, swelling, any systemic issues, stop it completely and let us know ASAP. If she tolerates it, I can extend longer with higher dose if needed.

## 2019-02-26 IMAGING — MG MM DIGITAL SCREENING BILAT W/ TOMO W/ CAD
8 of 14 series · 8 of 30 positions shown · non-contrast
Comparison: Previous exam(s).

CLINICAL DATA: Screening.

EXAM:
2D DIGITAL SCREENING BILATERAL MAMMOGRAM WITH CAD AND ADJUNCT TOMO

[R XCCL]
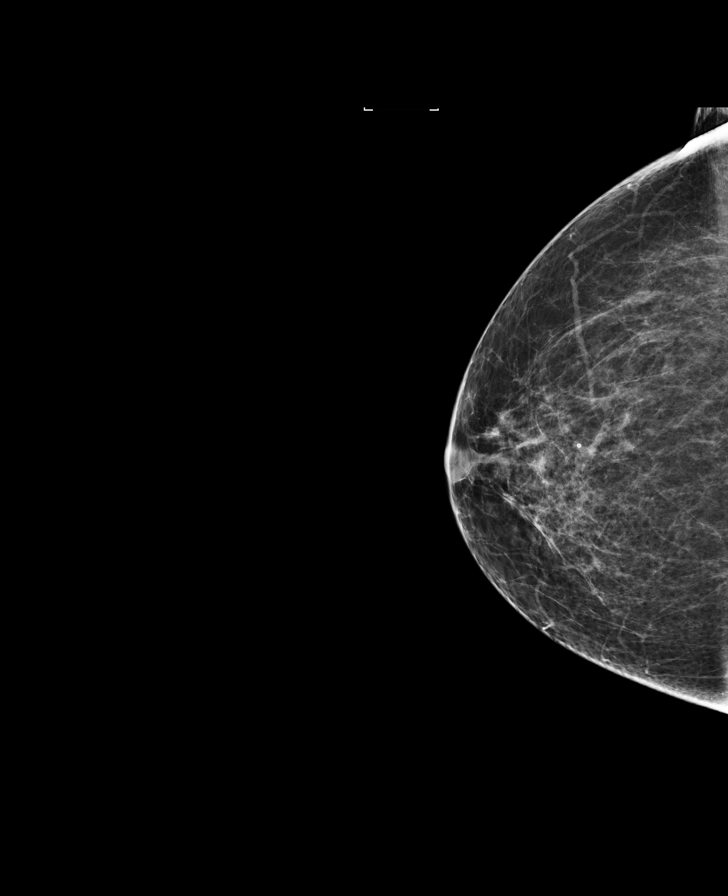

[R MLO (1 of 2)]
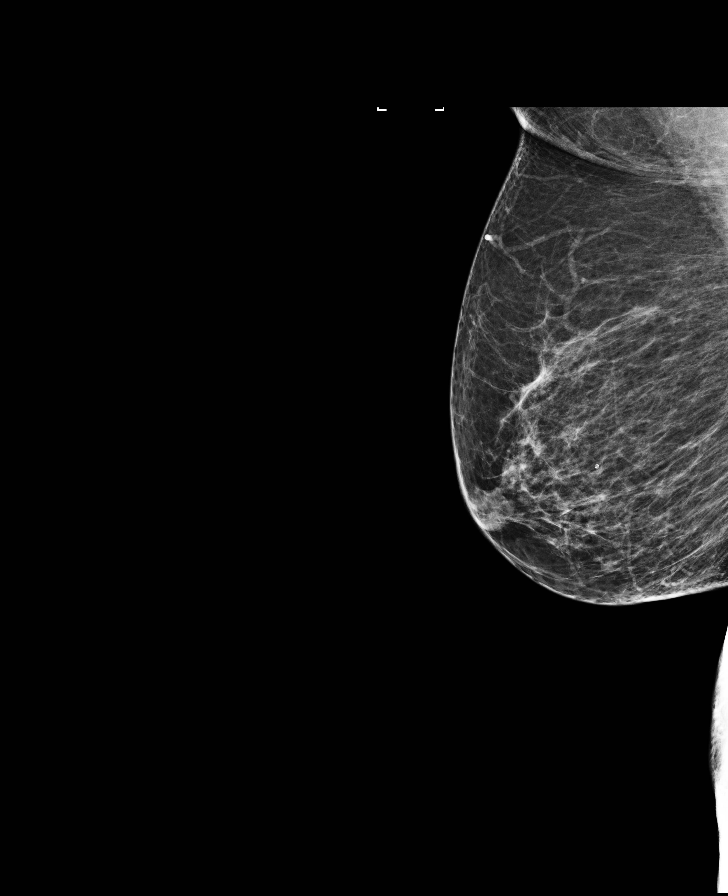

[L CC]
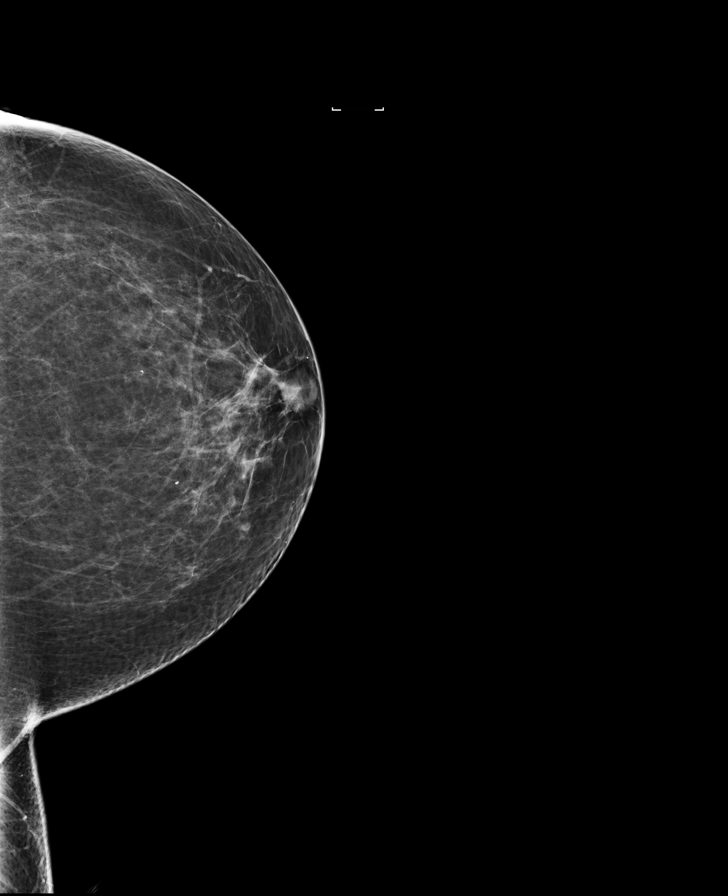

[L MLO]
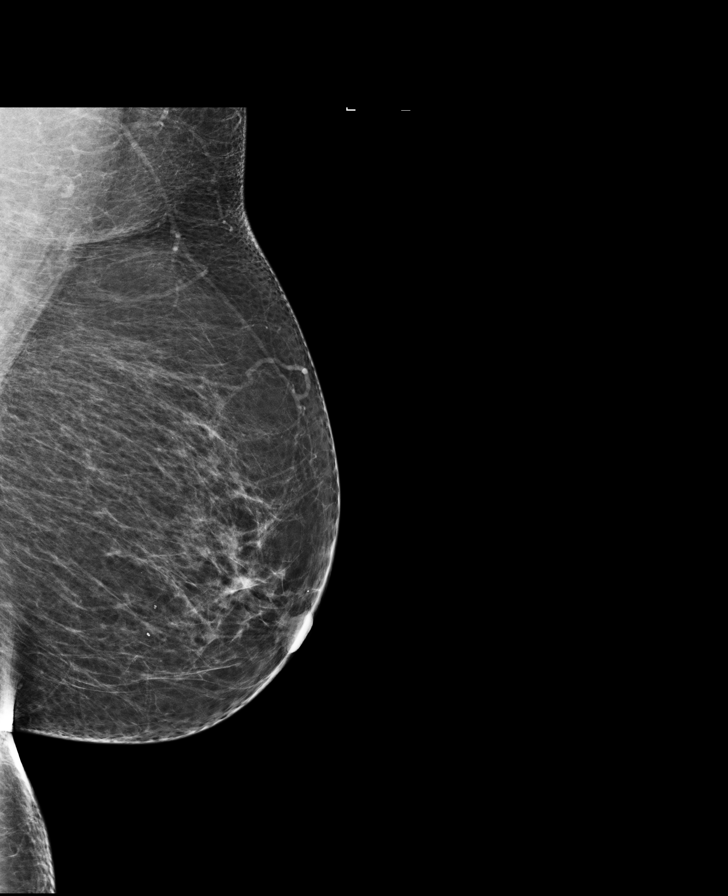

[L CC synth-2D]
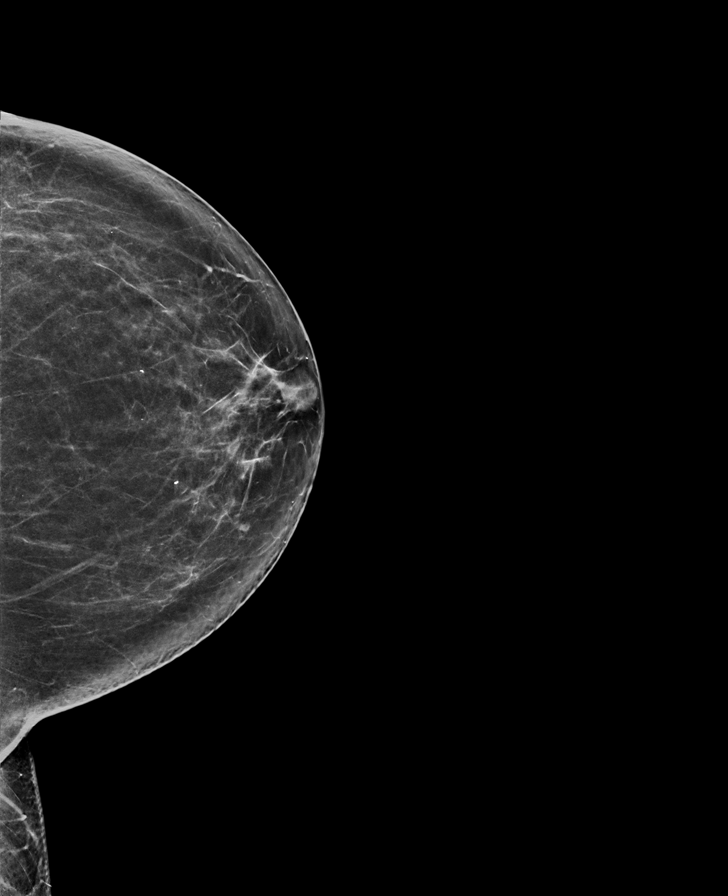

[R MLO (2 of 2)]
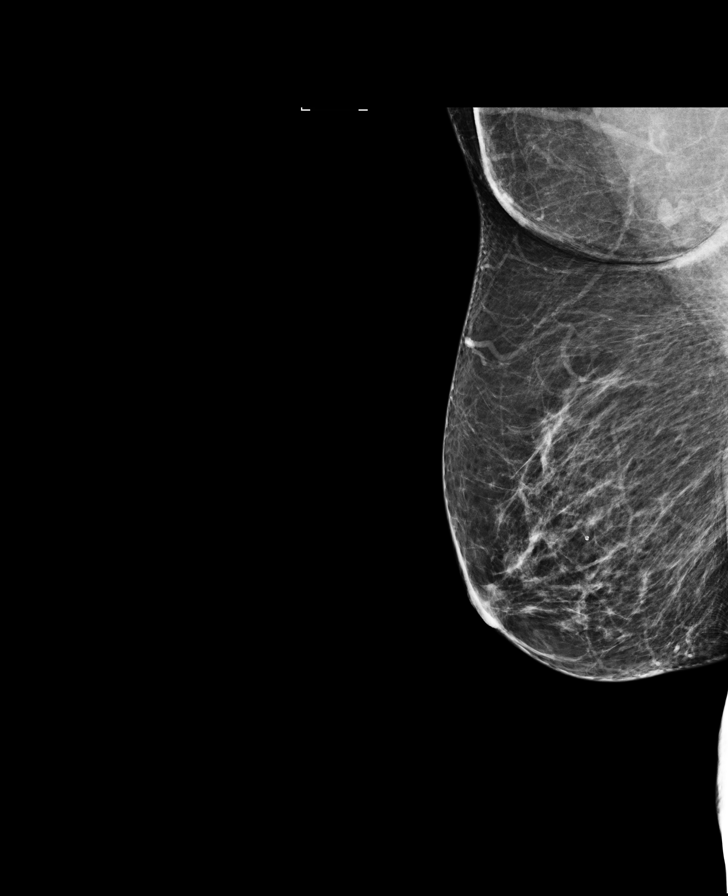

[R MLO synth-2D]
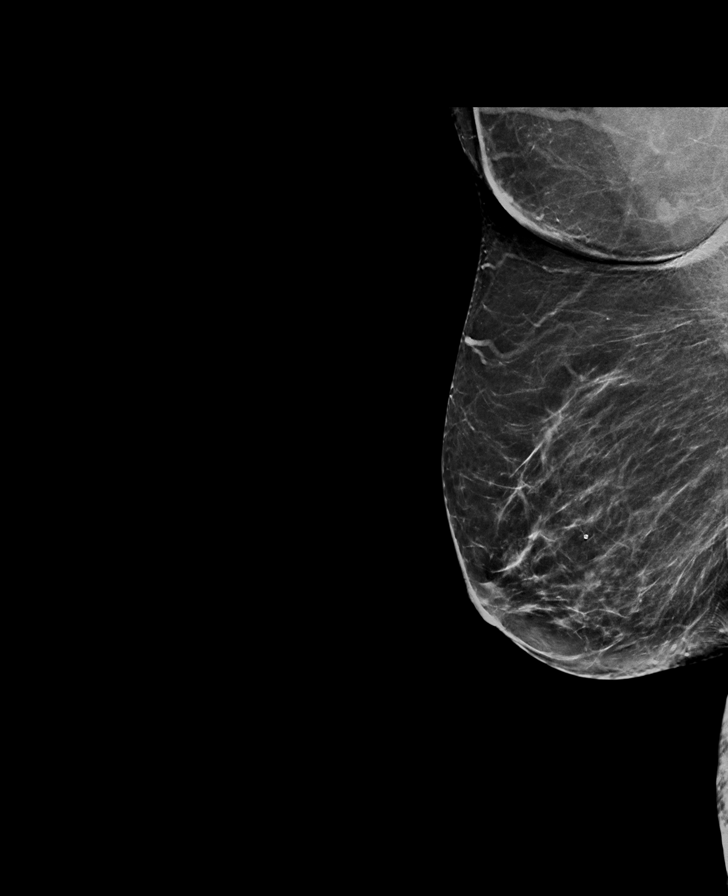

[R CC synth-2D]
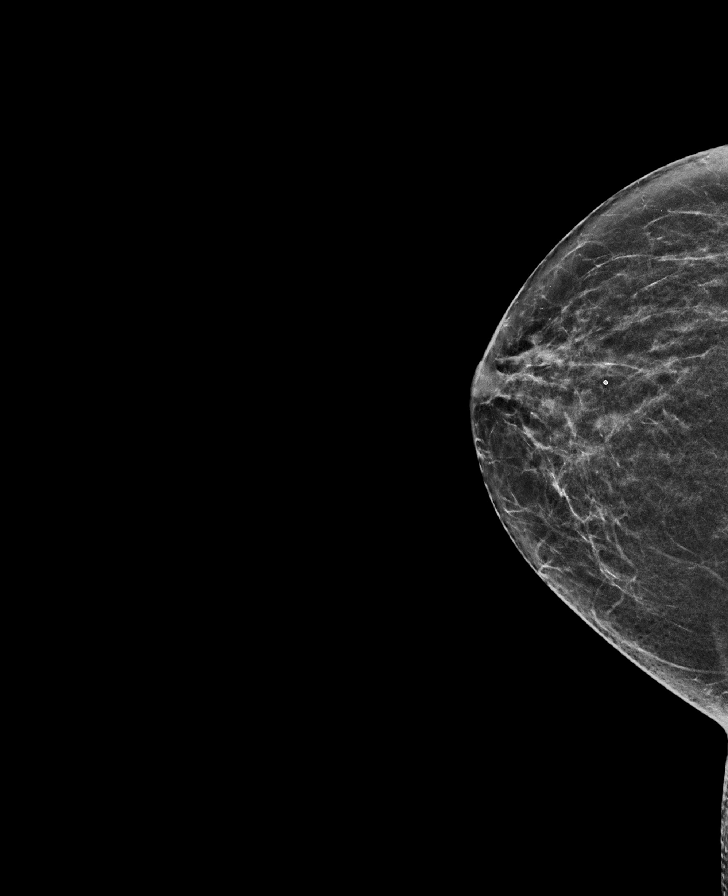

[8 of 30 positions shown; findings below may reference images not displayed]

ACR Breast Density Category b: There are scattered areas of
fibroglandular density.
FINDINGS: There are no findings suspicious for malignancy. Images were
processed with CAD.
IMPRESSION: No mammographic evidence of malignancy. A result letter of this
screening mammogram will be mailed directly to the patient.

RECOMMENDATION:
Screening mammogram in one year. (Code:97-6-RS4)

BI-RADS CATEGORY  1: Negative.

## 2019-03-10 NOTE — Telephone Encounter (Signed)
Pt left v/m that she is almost out of her med for sciatica; pt still cannot sleep in bed and has been sleeping in recliner. Pt wants to know if could get order for PT or should pt see chiropractor. Pt request cb.

## 2019-03-12 DIAGNOSIS — M25512 Pain in left shoulder: Secondary | ICD-10-CM | POA: Diagnosis not present

## 2019-03-12 DIAGNOSIS — S4992XA Unspecified injury of left shoulder and upper arm, initial encounter: Secondary | ICD-10-CM | POA: Diagnosis not present

## 2019-03-12 DIAGNOSIS — W108XXA Fall (on) (from) other stairs and steps, initial encounter: Secondary | ICD-10-CM | POA: Diagnosis not present

## 2019-03-12 MED ORDER — DICLOFENAC SODIUM 75 MG PO TBEC: 75.0000 mg | DELAYED_RELEASE_TABLET | Freq: Two times a day (BID) | ORAL | 2 refills | Status: DC

## 2019-03-12 MED ORDER — TIZANIDINE HCL 4 MG PO TABS: 2.0000 mg | ORAL_TABLET | Freq: Every evening | ORAL | 2 refills | Status: AC | PRN

## 2019-03-12 NOTE — Addendum Note (Signed)
Addended by: Owens Loffler on: 03/12/2019 09:03 AM   Modules accepted: Orders

## 2019-03-16 NOTE — Telephone Encounter (Signed)
Pt left v/m that she has not heard response for request of PT or chiropractor referral. Pt request cb.

## 2019-03-16 NOTE — Telephone Encounter (Signed)
Patient scheduled at [facility] PT and aware.

## 2019-03-16 NOTE — Addendum Note (Signed)
Addended by: Owens Loffler on: 03/16/2019 01:21 PM   Modules accepted: Orders

## 2019-03-16 NOTE — Telephone Encounter (Signed)
Please apologize and thank her for calling back.  I completely thought that I had set up her referral, but when I look back in her chart I did not.  I sincerely apologize for my mistake.

## 2019-03-21 DIAGNOSIS — D3A098 Benign carcinoid tumors of other sites: Secondary | ICD-10-CM | POA: Diagnosis not present

## 2019-03-23 DIAGNOSIS — R2689 Other abnormalities of gait and mobility: Secondary | ICD-10-CM | POA: Diagnosis not present

## 2019-03-23 DIAGNOSIS — M545 Low back pain: Secondary | ICD-10-CM | POA: Diagnosis not present

## 2019-03-28 DIAGNOSIS — R2689 Other abnormalities of gait and mobility: Secondary | ICD-10-CM | POA: Diagnosis not present

## 2019-03-28 DIAGNOSIS — M545 Low back pain: Secondary | ICD-10-CM | POA: Diagnosis not present

## 2019-04-25 ENCOUNTER — Other Ambulatory Visit: Payer: Self-pay | Admitting: Internal Medicine

## 2019-05-03 DIAGNOSIS — M545 Low back pain: Secondary | ICD-10-CM | POA: Diagnosis not present

## 2019-05-03 DIAGNOSIS — R2689 Other abnormalities of gait and mobility: Secondary | ICD-10-CM | POA: Diagnosis not present

## 2019-05-04 DIAGNOSIS — M79605 Pain in left leg: Secondary | ICD-10-CM | POA: Diagnosis not present

## 2019-05-04 DIAGNOSIS — C7B09 Secondary carcinoid tumors of other sites: Secondary | ICD-10-CM | POA: Diagnosis not present

## 2019-05-04 DIAGNOSIS — R918 Other nonspecific abnormal finding of lung field: Secondary | ICD-10-CM | POA: Diagnosis not present

## 2019-05-04 DIAGNOSIS — M79604 Pain in right leg: Secondary | ICD-10-CM | POA: Diagnosis not present

## 2019-05-04 DIAGNOSIS — M25551 Pain in right hip: Secondary | ICD-10-CM | POA: Diagnosis not present

## 2019-05-04 DIAGNOSIS — D3A098 Benign carcinoid tumors of other sites: Secondary | ICD-10-CM | POA: Diagnosis not present

## 2019-05-04 DIAGNOSIS — E236 Other disorders of pituitary gland: Secondary | ICD-10-CM | POA: Diagnosis not present

## 2019-05-04 DIAGNOSIS — R110 Nausea: Secondary | ICD-10-CM | POA: Diagnosis not present

## 2019-05-04 DIAGNOSIS — C7A010 Malignant carcinoid tumor of the duodenum: Secondary | ICD-10-CM | POA: Diagnosis not present

## 2019-05-05 DIAGNOSIS — R2689 Other abnormalities of gait and mobility: Secondary | ICD-10-CM | POA: Diagnosis not present

## 2019-05-05 DIAGNOSIS — M545 Low back pain: Secondary | ICD-10-CM | POA: Diagnosis not present

## 2019-05-10 DIAGNOSIS — R2689 Other abnormalities of gait and mobility: Secondary | ICD-10-CM | POA: Diagnosis not present

## 2019-05-10 DIAGNOSIS — M545 Low back pain: Secondary | ICD-10-CM | POA: Diagnosis not present

## 2019-05-13 DIAGNOSIS — M545 Low back pain: Secondary | ICD-10-CM | POA: Diagnosis not present

## 2019-05-13 DIAGNOSIS — R2689 Other abnormalities of gait and mobility: Secondary | ICD-10-CM | POA: Diagnosis not present

## 2019-05-17 DIAGNOSIS — D3A098 Benign carcinoid tumors of other sites: Secondary | ICD-10-CM | POA: Diagnosis not present

## 2019-05-19 DIAGNOSIS — M5441 Lumbago with sciatica, right side: Secondary | ICD-10-CM | POA: Diagnosis not present

## 2019-05-19 DIAGNOSIS — G8929 Other chronic pain: Secondary | ICD-10-CM | POA: Diagnosis not present

## 2019-05-19 DIAGNOSIS — M5442 Lumbago with sciatica, left side: Secondary | ICD-10-CM | POA: Diagnosis not present

## 2019-05-20 ENCOUNTER — Other Ambulatory Visit: Payer: Self-pay | Admitting: Student

## 2019-05-20 DIAGNOSIS — G8929 Other chronic pain: Secondary | ICD-10-CM

## 2019-05-20 DIAGNOSIS — M5442 Lumbago with sciatica, left side: Secondary | ICD-10-CM

## 2019-05-23 DIAGNOSIS — M545 Low back pain: Secondary | ICD-10-CM | POA: Diagnosis not present

## 2019-05-23 DIAGNOSIS — R2689 Other abnormalities of gait and mobility: Secondary | ICD-10-CM | POA: Diagnosis not present

## 2019-05-30 ENCOUNTER — Other Ambulatory Visit: Payer: Self-pay

## 2019-05-30 MED ORDER — HYDROCHLOROTHIAZIDE 25 MG PO TABS: ORAL_TABLET | ORAL | 1 refills | Status: DC

## 2019-05-31 DIAGNOSIS — R2689 Other abnormalities of gait and mobility: Secondary | ICD-10-CM | POA: Diagnosis not present

## 2019-05-31 DIAGNOSIS — M545 Low back pain: Secondary | ICD-10-CM | POA: Diagnosis not present

## 2019-06-01 ENCOUNTER — Other Ambulatory Visit: Payer: Self-pay

## 2019-06-01 ENCOUNTER — Ambulatory Visit
Admission: RE | Admit: 2019-06-01 | Discharge: 2019-06-01 | Disposition: A | Discharge status: Home / Self Care | Payer: Medicare Other | Source: Ambulatory Visit | Attending: Student | Admitting: Student

## 2019-06-01 DIAGNOSIS — M48061 Spinal stenosis, lumbar region without neurogenic claudication: Secondary | ICD-10-CM | POA: Diagnosis not present

## 2019-06-01 DIAGNOSIS — M5442 Lumbago with sciatica, left side: Secondary | ICD-10-CM | POA: Insufficient documentation

## 2019-06-01 DIAGNOSIS — G8929 Other chronic pain: Secondary | ICD-10-CM | POA: Insufficient documentation

## 2019-06-01 DIAGNOSIS — M5441 Lumbago with sciatica, right side: Secondary | ICD-10-CM | POA: Diagnosis not present

## 2019-06-01 MED ORDER — GADOBUTROL 1 MMOL/ML IV SOLN
6.0000 mL | Freq: Once | INTRAVENOUS | Status: AC | PRN
  Administered 2019-06-01: 6 mL via INTRAVENOUS

## 2019-06-02 DIAGNOSIS — M545 Low back pain: Secondary | ICD-10-CM | POA: Diagnosis not present

## 2019-06-02 DIAGNOSIS — R2689 Other abnormalities of gait and mobility: Secondary | ICD-10-CM | POA: Diagnosis not present

## 2019-06-08 DIAGNOSIS — R2689 Other abnormalities of gait and mobility: Secondary | ICD-10-CM | POA: Diagnosis not present

## 2019-06-08 DIAGNOSIS — M545 Low back pain: Secondary | ICD-10-CM | POA: Diagnosis not present

## 2019-06-10 DIAGNOSIS — R2689 Other abnormalities of gait and mobility: Secondary | ICD-10-CM | POA: Diagnosis not present

## 2019-06-10 DIAGNOSIS — M545 Low back pain: Secondary | ICD-10-CM | POA: Diagnosis not present

## 2019-06-14 DIAGNOSIS — D3A098 Benign carcinoid tumors of other sites: Secondary | ICD-10-CM | POA: Diagnosis not present

## 2019-06-22 DIAGNOSIS — M545 Low back pain: Secondary | ICD-10-CM | POA: Diagnosis not present

## 2019-06-22 DIAGNOSIS — R2689 Other abnormalities of gait and mobility: Secondary | ICD-10-CM | POA: Diagnosis not present

## 2019-06-22 DIAGNOSIS — Z23 Encounter for immunization: Secondary | ICD-10-CM | POA: Diagnosis not present

## 2019-06-23 DIAGNOSIS — R2689 Other abnormalities of gait and mobility: Secondary | ICD-10-CM | POA: Diagnosis not present

## 2019-06-23 DIAGNOSIS — M545 Low back pain: Secondary | ICD-10-CM | POA: Diagnosis not present

## 2019-07-01 DIAGNOSIS — M545 Low back pain: Secondary | ICD-10-CM | POA: Diagnosis not present

## 2019-07-01 DIAGNOSIS — R2689 Other abnormalities of gait and mobility: Secondary | ICD-10-CM | POA: Diagnosis not present

## 2019-07-05 DIAGNOSIS — M48062 Spinal stenosis, lumbar region with neurogenic claudication: Secondary | ICD-10-CM | POA: Diagnosis not present

## 2019-07-05 DIAGNOSIS — M5416 Radiculopathy, lumbar region: Secondary | ICD-10-CM | POA: Diagnosis not present

## 2019-07-05 DIAGNOSIS — M5136 Other intervertebral disc degeneration, lumbar region: Secondary | ICD-10-CM | POA: Diagnosis not present

## 2019-07-12 DIAGNOSIS — D3A098 Benign carcinoid tumors of other sites: Secondary | ICD-10-CM | POA: Diagnosis not present

## 2019-07-14 ENCOUNTER — Telehealth: Payer: Self-pay | Admitting: Internal Medicine

## 2019-07-14 NOTE — Progress Notes (Signed)
  Chronic Care Management   Outreach Note  07/14/2019 Name: Regina Chapman MRN: [id number] DOB: Dec 07, 1950  Referred by: [name], MD Reason for referral : No chief complaint on file.   An unsuccessful telephone outreach was attempted today. The patient was referred to the pharmacist for assistance with care management and care coordination.   Follow Up Plan:   Raynicia Dukes [employer] Scheduler

## 2019-07-14 NOTE — Progress Notes (Signed)
  Chronic Care Management   Note  07/14/2019 Name: MARELI ANTUNES MRN: [id number] DOB: 1951/02/25  KRISTYL ATHENS is a 69 y.o. year old female who is a primary care patient of [place], Theophilus Kinds, MD. I reached out to Dory Horn by phone today in response to a referral sent by Ms. Stephens Shire Lincks's PCP, [name], MD.   Ms. Tays was given information about Chronic Care Management services today including:  1. CCM service includes personalized support from designated clinical staff supervised by her physician, including individualized plan of care and coordination with other care providers 2. 24/7 contact phone numbers for assistance for urgent and routine care needs. 3. Service will only be billed when office clinical staff spend 20 minutes or more in a month to coordinate care. 4. Only one practitioner may furnish and bill the service in a calendar month. 5. The patient may stop CCM services at any time (effective at the end of the month) by phone call to the office staff.   Patient agreed to services and verbal consent obtained.   Follow up plan:   Raynicia Dukes [employer] Scheduler

## 2019-07-20 DIAGNOSIS — R2689 Other abnormalities of gait and mobility: Secondary | ICD-10-CM | POA: Diagnosis not present

## 2019-07-20 DIAGNOSIS — Z23 Encounter for immunization: Secondary | ICD-10-CM | POA: Diagnosis not present

## 2019-07-20 DIAGNOSIS — M545 Low back pain: Secondary | ICD-10-CM | POA: Diagnosis not present

## 2019-07-30 ENCOUNTER — Telehealth: Payer: Self-pay

## 2019-07-30 DIAGNOSIS — I10 Essential (primary) hypertension: Secondary | ICD-10-CM

## 2019-07-30 DIAGNOSIS — E785 Hyperlipidemia, unspecified: Secondary | ICD-10-CM

## 2019-07-30 NOTE — Telephone Encounter (Signed)
I would like to request a referral for Regina Chapman to chronic care management pharmacy services for the following conditions:   Essential hypertension, benign  [I10]  Hyperlipidemia [E78.5]  Debbora Dus, PharmD Clinical Pharmacist [place] [facility] at [facility] [phone]

## 2019-08-01 NOTE — Telephone Encounter (Signed)
Referral created.

## 2019-08-03 ENCOUNTER — Ambulatory Visit: Payer: Medicare Other

## 2019-08-03 ENCOUNTER — Other Ambulatory Visit: Payer: Self-pay

## 2019-08-03 DIAGNOSIS — I10 Essential (primary) hypertension: Secondary | ICD-10-CM

## 2019-08-03 DIAGNOSIS — E785 Hyperlipidemia, unspecified: Secondary | ICD-10-CM

## 2019-08-03 NOTE — Patient Instructions (Addendum)
August 03, 2019  Dear Dory Horn,  It was a pleasure meeting you during our initial appointment on August 03, 2019. Below is a summary of the goals we discussed and components of chronic care management. Please contact me anytime with questions or concerns.   Visit Information  Goals Addressed            This Visit's Progress   . Pharmacy Care Plan       CARE PLAN ENTRY  Current Barriers:  . Chronic Disease Management support, education, and care coordination needs related to hypertension, hyperlipidemia, vaccinations  Pharmacist Clinical Goal(s):  Marland Kitchen Over the next 3 months, patient will work with PharmD and primary care provider to address the following goals: o Hypertension: Maintain blood pressure goal of less than 140/90 mmHg - Continue losartan 100 mg daily and HCTZ 25 mg daily  o Cholesterol: Reduce LDL cholesterol within goal of less than 100 mg/dL - Previous LDL (2016): 131; recommend updating lipid panel  o Vaccinations: Remain up to date on vaccinations. Recommend 2-dose series of shingles vaccine (Shingrix). o Medication tolerance/cost: Review and assist with cost concerns/flavor options for potassium bicarbonate   Interventions: . Comprehensive medication review performed . Coordinate with nephrology for medication change to potassium citrate . Pharmacy coordination, adherence packaging, and delivery services  Patient Self Care Activities:  For the next 3 months until follow up visit:  . May discontinue once weekly aspirin 81 mg due to low benefit and high bleeding risk with age . Review handout on cholesterol content in foods; Consider starting a cholesterol medication to reduce your risk of heart attacks and strokes . Continue monitoring blood pressure 2-3 days per week, documenting, and providing at office visits. Call if blood pressure is persistently elevated . Check with pharmacy to see if price on Shingrix vaccine has changed. Minette Brine pharmacist, Sharyn Lull,  for any medication refills, questions, or concerns  Initial goal documentation       Ms. Schriever was given information about Chronic Care Management services today including:  1. CCM service includes personalized support from designated clinical staff supervised by her physician, including individualized plan of care and coordination with other care providers 2. 24/7 contact phone numbers for assistance for urgent and routine care needs. 3. Standard insurance, coinsurance, copays and deductibles apply for chronic care management only during months in which we provide at least 20 minutes of these services. Most insurances cover these services at 100%, however patients may be responsible for any copay, coinsurance and/or deductible if applicable. This service may help you avoid the need for more expensive face-to-face services. 4. Only one practitioner may furnish and bill the service in a calendar month. 5. The patient may stop CCM services at any time (effective at the end of the month) by phone call to the office staff.  Patient agreed to services and verbal consent obtained.   The patient verbalized understanding of instructions provided today and agreed to receive a mailed copy of patient instruction and/or educational materials.  Telephone follow up appointment with pharmacy team member scheduled for:  (11/02/19 at 11:00 AM telephone)  Debbora Dus, PharmD Clinical Pharmacist [place] [facility] at [facility] [phone]   Cholesterol Content in Foods Cholesterol is a waxy, fat-like substance that helps to carry fat in the blood. The body needs cholesterol in small amounts, but too much cholesterol can cause damage to the arteries and heart. Most people should eat less than 200 milligrams (mg) of cholesterol a day.  Foods with cholesterol  Cholesterol is found in animal-based foods, such as meat, seafood, and dairy. Generally, low-fat dairy and lean meats have less cholesterol  than full-fat dairy and fatty meats. The milligrams of cholesterol per serving (mg per serving) of common cholesterol-containing foods are listed below. Meat and other proteins  Egg -- one large whole egg has 186 mg.  Veal shank -- 4 oz has 141 mg.  Lean ground [place] (93% lean) -- 4 oz has 118 mg.  Fat-trimmed lamb loin -- 4 oz has 106 mg.  Lean ground beef (90% lean) -- 4 oz has 100 mg.  Lobster -- 3.5 oz has 90 mg.  Pork loin chops -- 4 oz has 86 mg.  Canned salmon -- 3.5 oz has 83 mg.  Fat-trimmed beef top loin -- 4 oz has 78 mg.  Frankfurter -- 1 frank (3.5 oz) has 77 mg.  Crab -- 3.5 oz has 71 mg.  Roasted chicken without skin, white meat -- 4 oz has 66 mg.  Light bologna -- 2 oz has 45 mg.  Deli-cut [place] -- 2 oz has 31 mg.  Canned tuna -- 3.5 oz has 31 mg.  Berniece Salines -- 1 oz has 29 mg.  Oysters and mussels (raw) -- 3.5 oz has 25 mg.  Mackerel -- 1 oz has 22 mg.  Trout -- 1 oz has 20 mg.  Pork sausage -- 1 link (1 oz) has 17 mg.  Salmon -- 1 oz has 16 mg.  Tilapia -- 1 oz has 14 mg. Dairy  Soft-serve ice cream --  cup (4 oz) has 103 mg.  Whole-milk yogurt -- 1 cup (8 oz) has 29 mg.  Cheddar cheese -- 1 oz has 28 mg.  American cheese -- 1 oz has 28 mg.  Whole milk -- 1 cup (8 oz) has 23 mg.  2% milk -- 1 cup (8 oz) has 18 mg.  Cream cheese -- 1 tablespoon (Tbsp) has 15 mg.  Cottage cheese --  cup (4 oz) has 14 mg.  Low-fat (1%) milk -- 1 cup (8 oz) has 10 mg.  Sour cream -- 1 Tbsp has 8.5 mg.  Low-fat yogurt -- 1 cup (8 oz) has 8 mg.  Nonfat Greek yogurt -- 1 cup (8 oz) has 7 mg.  Half-and-half cream -- 1 Tbsp has 5 mg. Fats and oils  Cod liver oil -- 1 tablespoon (Tbsp) has 82 mg.  Butter -- 1 Tbsp has 15 mg.  Lard -- 1 Tbsp has 14 mg.  Bacon grease -- 1 Tbsp has 14 mg.  Mayonnaise -- 1 Tbsp has 5-10 mg.  Margarine -- 1 Tbsp has 3-10 mg. Exact amounts of cholesterol in these foods may vary depending on specific ingredients  and brands. Foods without cholesterol Most plant-based foods do not have cholesterol unless you combine them with a food that has cholesterol. Foods without cholesterol include:  Grains and cereals.  Vegetables.  Fruits.  Vegetable oils, such as olive, canola, and sunflower oil.  Legumes, such as peas, beans, and lentils.  Nuts and seeds.  Egg whites. Summary  The body needs cholesterol in small amounts, but too much cholesterol can cause damage to the arteries and heart.  Most people should eat less than 200 milligrams (mg) of cholesterol a day. This information is not intended to replace advice given to you by your health care provider. Make sure you discuss any questions you have with your health care provider. Document Revised: 03/27/2017 Document Reviewed: 12/09/2016 Elsevier  Patient Education  [employer].

## 2019-08-03 NOTE — Chronic Care Management (AMB) (Signed)
Chronic Care Management Pharmacy  Name: ANNAMARIA SALAH  MRN: [id number] DOB: Aug 16, 1950  Chief Complaint/ HPI  Dory Horn,  69 y.o., female presents for their Initial CCM visit with the clinical pharmacist via telephone.  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 Augmentin [Amoxicillin-Pot Clavulanate] Nausea And Vomiting    Able to tolerate Amoxil  . Cortisone Other (See Comments)    Pt had depomedrol inj and next day had red face,puffiness in face and slight swelling in face; no SOB or swelling in mouth,tongue or throat.   Medications: Outpatient Encounter Medications as of 08/03/2019  Medication Sig  . aspirin 81 MG tablet Take 81 mg by mouth 2 (two) times a week.   . Biotin 10000 MCG TABS Take 1 tablet by mouth daily.  Marland Kitchen buPROPion (WELLBUTRIN SR) 150 MG 12 hr tablet Take 1 tablet (150 mg total) by mouth 2 (two) times daily.  . cholecalciferol (VITAMIN D) 1000 units tablet Take 1,000 Units by mouth daily.  Marland Kitchen dexamethasone (DECADRON) 4 MG tablet Take 1/2 tablet  X 2 days, then 1 tablet po x 8 days.  . diclofenac (VOLTAREN) 75 MG EC tablet Take 1 tablet (75 mg total) by mouth 2 (two) times daily.  . hydrochlorothiazide (HYDRODIURIL) 25 MG tablet TAKE 1 TABLET(25 MG) BY MOUTH DAILY  .  loratadine (CLARITIN) 10 MG tablet Take 1 tablet (10 mg total) by mouth daily.  Marland Kitchen losartan (COZAAR) 100 MG tablet TAKE 1 TABLET(100 MG) BY MOUTH DAILY  . Omega 3 1000 MG CAPS Take 2 capsules by mouth daily.  . potassium citrate (UROCIT-K) 10 MEQ (1080 MG) SR tablet Take 20 mEq by mouth 2 (two) times daily.  . traZODone (DESYREL) 50 MG tablet TAKE 1 TO 2 TABLETS(50 TO 100 MG) BY MOUTH AT BEDTIME AS NEEDED   No facility-administered encounter medications on file as of 08/03/2019.   Current Diagnosis/Assessment:   Merchant navy officer:   . Difficulty of Paying Living Expenses:    Goals    . Pharmacy Care Plan     CARE PLAN ENTRY  Current Barriers:  . Chronic Disease Management support, education, and care coordination needs related to hypertension, hyperlipidemia, vaccinations  Pharmacist Clinical Goal(s):  Marland Kitchen Over the next 3 months, patient will work with PharmD and primary care provider to address the following goals: o Hypertension: Maintain blood pressure goal of less than 140/90 mmHg - Continue losartan 100 mg daily and HCTZ 25 mg daily  o Cholesterol: Reduce LDL cholesterol within goal of less than 100 mg/dL - Previous LDL (2016): 131; recommend updating lipid panel  o Vaccinations: Remain up to date on vaccinations. Recommend 2-dose series of shingles vaccine (Shingrix). o Medication tolerance/cost: Review and assist with cost concerns/flavor options for potassium bicarbonate   Interventions: . Comprehensive medication review performed . Coordinate with nephrology for medication change to potassium citrate .  Pharmacy coordination, adherence packaging, and delivery services  Patient Self Care Activities:  For the next 3 months until follow up visit:  . May discontinue once weekly aspirin 81 mg due to low benefit and high bleeding risk with age . Review handout on cholesterol content in foods; Consider starting a cholesterol medication to reduce your risk of heart attacks and  strokes . Continue monitoring blood pressure 2-3 days per week, documenting, and providing at office visits. Call if blood pressure is persistently elevated . Check with pharmacy to see if price on Shingrix vaccine has changed. Minette Brine pharmacist, Sharyn Lull, for any medication refills, questions, or concerns  Initial goal documentation      Hypertension   CMP Latest Ref Rng & Units 07/07/2017 06/22/2017 09/26/2016  Glucose 70 - 99 mg/dL 97 95 91  BUN 6 - 23 mg/dL 16 12 19   Creatinine 0.40 - 1.20 mg/dL 0.77 0.78 0.89  Sodium 135 - 145 mEq/L 138 139 140  Potassium 3.5 - 5.1 mEq/L 4.1 4.1 4.2  Chloride 96 - 112 mEq/L 99 100 103  CO2 19 - 32 mEq/L 32 31 30  Calcium 8.4 - 10.5 mg/dL 10.4 10.8(H) 10.2  Total Protein 6.0 - 8.3 g/dL - 7.9 7.8  Total Bilirubin 0.2 - 1.2 mg/dL - 0.5 0.5  Alkaline Phos 39 - 117 U/L - 58 53  AST 0 - 37 U/L - 19 21  ALT 0 - 35 U/L - 24 19   Office blood pressures are: BP Readings from Last 3 Encounters:  02/16/19 132/70  12/07/18 138/82  04/13/18 120/82   BP goal < 140/90 mmHg Patient has failed these meds in the past: none Patient checks BP at home 3-5x per week Patient home BP readings are ranging: 120s/70s, occasionally around 140/80s   Patient is currently controlled on the following medications:   Losartan 100 mg - 1 tablet daily (AM)  Hydrochlorothiazide 25 mg - 1 tablet daily (AM)  We discussed: confirmed adherence, educated on blood pressure technique   Plan: Continue current medications; Continue monitoring blood pressure, documenting, and providing at office visits.   Hyperlipidemia   CBC Latest Ref Rng & Units 06/22/2017 09/26/2016 01/20/2014  WBC 4.0 - 10.5 K/uL 9.8 7.7 8.8  Hemoglobin 12.0 - 15.0 g/dL 14.8 15.3(H) 15.6(H)  Hematocrit 36.0 - 46.0 % 43.2 45.8 46.4(H)  Platelets 150.0 - 400.0 K/uL 290.0 259.0 233.0   Lipid Panel     Component Value Date/Time   CHOL 187 04/04/2015 0936   TRIG 106.0 04/04/2015 0936   HDL 34.90 (L)  04/04/2015 0936   CHOLHDL 5 04/04/2015 0936   VLDL 21.2 04/04/2015 0936   LDLCALC 131 (H) 04/04/2015 0936   LDLDIRECT 183.9 02/09/2013 1133    Last lipid panel: 2016 LDL goal < 100 Patient has failed these meds in past: none Patient is currently uncontrolled on the following medications:   Aspirin 81 mg - 1 tablet daily (taking once weekly)  Omega 3 fatty acids (EPA/DHA) 1200 mg - 2 capsules once daily   We discussed: prefers not to add additional medication; discussed discontinuing aspirin due to age/little benefit with primary prevention; due for updated lipid panel   Plan: Continue current medications; May discontinue once weekly aspirin for primary prevention to reduce bleeding risk/pill burden. Recommend updating lipid panel.   Depression    Patient has failed these meds in past: none  Patient is currently controlled on the following medications:   Bupropion 150 mg 12 hr - 1 tablet  BID   Trazodone 50 mg - 1-2 tablets qhs   We discussed: dose increased in August 2020, doing well with both sleep and mood  Plan: Continue current medications  Kidney Stones   Patient has failed these meds in past: potassium citrate (cost - $50+) Patient is currently uncontrolled on the following medications:   Potassium bicarbonate effervescent tablet 25 mEq - 1 tablet daily mixed with glass of water   We discussed: sees nephrologist at [facility], wants to discuss switching back to citrate tablet as she doesn't like taste (orange) and texture of the bicarbonate  Plan: Continue current medications; Contact nephrology to request change back to potassium citrate for taste preference.   Vaccines   Reviewed and discussed patient's vaccination history.    Immunization History  Administered Date(s) Administered  . Influenza, High Dose Seasonal PF 04/29/2016, 03/03/2017, 01/06/2019  . Influenza, Seasonal, Injecte, Preservative Fre 01/23/2015  .  Influenza,inj,Quad PF,6+ Mos 02/09/2013, 01/20/2014  . Influenza-Unspecified 02/11/2012, 02/22/2018  . Pneumococcal Conjugate-13 07/20/2015  . Pneumococcal Polysaccharide-23 09/26/2016  . Tdap 02/09/2013   Plan: Recommended patient receive Shingrix (reports cost - $175); Unable to afford vaccine; received both doses of COVID-19 vaccine.   Medication Management  Misc: diclofenac 75 mg - BID (not taking), dexamethasone 8 day course (not taking)  OTCs: loratadine 10 mg - PRN (hasn't needed in a long time), biotin 10000 mcg - 1 daily, vitamin D 1000 IU - 2 tablets   Pharmacy/Benefits: Medicare Part D, [employer] [facility] --> would like to switch to [facility] delivery   Adherence: denies missed doses   Social support: lives alone, able to do ADLs, son lives nearby  Affordability: potassium citrate cost, most are relatively low cost   CCM Follow Up:  3 months (11/02/19 at 11:00 AM telephone)  Verbal consent obtained for [facility] enhanced pharmacy services (medication synchronization, adherence packaging, delivery coordination). A medication sync plan was created to allow patient to get all medications delivered once every 30 to 90 days per patient preference. Patient understands they have freedom to choose pharmacy and clinical pharmacist will coordinate care between all prescribers and [facility].  Debbora Dus, PharmD Clinical Pharmacist [facility] at [facility] [phone]

## 2019-08-05 DIAGNOSIS — M545 Low back pain: Secondary | ICD-10-CM | POA: Diagnosis not present

## 2019-08-05 DIAGNOSIS — R2689 Other abnormalities of gait and mobility: Secondary | ICD-10-CM | POA: Diagnosis not present

## 2019-08-09 DIAGNOSIS — D3A098 Benign carcinoid tumors of other sites: Secondary | ICD-10-CM | POA: Diagnosis not present

## 2019-08-11 DIAGNOSIS — M5136 Other intervertebral disc degeneration, lumbar region: Secondary | ICD-10-CM | POA: Diagnosis not present

## 2019-08-11 DIAGNOSIS — M48062 Spinal stenosis, lumbar region with neurogenic claudication: Secondary | ICD-10-CM | POA: Diagnosis not present

## 2019-08-11 DIAGNOSIS — M5416 Radiculopathy, lumbar region: Secondary | ICD-10-CM | POA: Diagnosis not present

## 2019-08-12 DIAGNOSIS — H26493 Other secondary cataract, bilateral: Secondary | ICD-10-CM | POA: Diagnosis not present

## 2019-08-12 DIAGNOSIS — Z961 Presence of intraocular lens: Secondary | ICD-10-CM | POA: Diagnosis not present

## 2019-08-22 DIAGNOSIS — M545 Low back pain: Secondary | ICD-10-CM | POA: Diagnosis not present

## 2019-08-22 DIAGNOSIS — R2689 Other abnormalities of gait and mobility: Secondary | ICD-10-CM | POA: Diagnosis not present

## 2019-08-25 DIAGNOSIS — M545 Low back pain: Secondary | ICD-10-CM | POA: Diagnosis not present

## 2019-08-25 DIAGNOSIS — R2689 Other abnormalities of gait and mobility: Secondary | ICD-10-CM | POA: Diagnosis not present

## 2019-08-29 DIAGNOSIS — M545 Low back pain: Secondary | ICD-10-CM | POA: Diagnosis not present

## 2019-08-29 DIAGNOSIS — R2689 Other abnormalities of gait and mobility: Secondary | ICD-10-CM | POA: Diagnosis not present

## 2019-09-07 ENCOUNTER — Telehealth (INDEPENDENT_AMBULATORY_CARE_PROVIDER_SITE_OTHER): Payer: Medicare Other | Admitting: Internal Medicine

## 2019-09-07 ENCOUNTER — Encounter: Payer: Self-pay | Admitting: Internal Medicine

## 2019-09-07 ENCOUNTER — Telehealth: Payer: Self-pay

## 2019-09-07 DIAGNOSIS — J0140 Acute pansinusitis, unspecified: Secondary | ICD-10-CM | POA: Diagnosis not present

## 2019-09-07 MED ORDER — AMOXICILLIN 500 MG PO TABS: 1000.0000 mg | ORAL_TABLET | Freq: Two times a day (BID) | ORAL | 0 refills | Status: AC

## 2019-09-07 MED ORDER — BUPROPION HCL ER (SR) 150 MG PO TB12: 150.0000 mg | ORAL_TABLET | Freq: Two times a day (BID) | ORAL | 3 refills | Status: DC

## 2019-09-07 MED ORDER — LOSARTAN POTASSIUM 100 MG PO TABS: ORAL_TABLET | ORAL | 3 refills | Status: DC

## 2019-09-07 MED ORDER — HYDROCHLOROTHIAZIDE 25 MG PO TABS: ORAL_TABLET | ORAL | 3 refills | Status: DC

## 2019-09-07 NOTE — Assessment & Plan Note (Signed)
Started with ears 2 weeks ago and now worse Clear sinus symptoms Will empirically Rx with amoxil Consider broadening coverage (z-pak) if not better next week Discussed supportive care

## 2019-09-07 NOTE — Telephone Encounter (Signed)
Rxs sent electronically.  

## 2019-09-07 NOTE — Telephone Encounter (Signed)
Patient needs the following refills sent to [facility]: losartan, bupropion, and hydrochlorothiazide   Thanks!  Debbora Dus, PharmD Clinical Pharmacist [facility] at [facility] [phone]

## 2019-09-07 NOTE — Progress Notes (Signed)
Subjective:    Patient ID: Regina Chapman, female    DOB: 05/02/1950, 69 y.o.   MRN: [id number]  HPI Virtual visit due to sinus symptoms Identification done Reviewed billing and she gave consent Participants--patient in her home and I am in my office  Had pain in ears since a couple of weeks ago--felt very deep Got better--but now back Now with sore throat, cough Right frontal and maxillary pressure and headache Slightly on left  Slight drainage Cough--mostly at night No SOB  Does take claritin Never took flonase Takes aleve for the pressure  Current Outpatient Medications on File Prior to Visit  Medication Sig Dispense Refill  . aspirin 81 MG tablet Take 81 mg by mouth 2 (two) times a week.     . Biotin 10000 MCG TABS Take 1 tablet by mouth daily.    Marland Kitchen buPROPion (WELLBUTRIN SR) 150 MG 12 hr tablet Take 1 tablet (150 mg total) by mouth 2 (two) times daily. 180 tablet 3  . cholecalciferol (VITAMIN D) 1000 units tablet Take 1,000 Units by mouth daily.    . hydrochlorothiazide (HYDRODIURIL) 25 MG tablet TAKE 1 TABLET(25 MG) BY MOUTH DAILY 90 tablet 1  . loratadine (CLARITIN) 10 MG tablet Take 1 tablet (10 mg total) by mouth daily. 30 tablet 5  . losartan (COZAAR) 100 MG tablet TAKE 1 TABLET(100 MG) BY MOUTH DAILY 90 tablet 3  . Omega 3 1000 MG CAPS Take 2 capsules by mouth daily.    . potassium citrate (UROCIT-K) 10 MEQ (1080 MG) SR tablet Take 20 mEq by mouth 2 (two) times daily.    . traZODone (DESYREL) 50 MG tablet TAKE 1 TO 2 TABLETS(50 TO 100 MG) BY MOUTH AT BEDTIME AS NEEDED 180 tablet 3   No current facility-administered medications on file prior to visit.    Allergies  Allergen Reactions  . Augmentin [Amoxicillin-Pot Clavulanate] Nausea And Vomiting    Able to tolerate Amoxil  . Cortisone Other (See Comments)    Pt had depomedrol inj and next day had red face,puffiness in face and slight swelling in face; no SOB or swelling in mouth,tongue or throat.    Past  Medical History:  Diagnosis Date  . Cancer ([place])    Melanoma  . 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 Depression   . History of melanoma 1985  . Hyperlipidemia   . Hypertension   . Kidney stones   . Obesity   . Obesity   . Psoriasis     Past Surgical History:  Procedure Laterality Date  . ABDOMINAL HYSTERECTOMY    . CATARACT EXTRACTION W/ INTRAOCULAR LENS  IMPLANT, BILATERAL  2013  . COLOSTOMY  2007  . LAPAROSCOPIC ASSISTED VAGINAL HYSTERECTOMY  1987   FOR ENDOMETRIOSIS  . LUNG REMOVAL, PARTIAL    . MELANOMA EXCISION  1985  . OOPHORECTOMY      Family History  Problem Relation Age of Onset  . Cervical cancer Sister        CERVICAL   . Heart disease Mother   . Dementia Mother   . Breast cancer Other   . Diabetes Neg Hx   . Hypertension Neg Hx   . Colon cancer Neg Hx     Social History   Socioeconomic History  . Marital status: Widowed    Spouse name: Not on file  . Number of children: 1  . Years of education: Not on file  . Highest education level: Not on file  Occupational History  . Occupation: Radiographer, therapeutic service    Comment: Retired 2013  Tobacco Use  . Smoking status: Never Smoker  . Smokeless tobacco: Never Used  Substance and Sexual Activity  . Alcohol use: Yes    Alcohol/week: 3.0 standard drinks    Types: 3 Glasses of wine per week    Comment: 3 PER WEEK  . Drug use: No  . Sexual activity: Never    Birth control/protection: Surgical  Other Topics Concern  . Not on file  Social History Narrative   No living will or health care POA   Would want son to make decisions   Would accept resuscitation but no prolonged ventilation   No tube feeds if cognitively unaware   Social Determinants of Health   Financial Resource Strain:   . Difficulty of Paying Living Expenses:   Food Insecurity:   . Worried About Charity fundraiser in the Last Year:   . Arboriculturist in the Last Year:   Transportation Needs:   . Film/video editor  (Medical):   Marland Kitchen Lack of Transportation (Non-Medical):   Physical Activity:   . Days of Exercise per Week:   . Minutes of Exercise per Session:   Stress:   . Feeling of Stress :   Social Connections:   . Frequency of Communication with Friends and Family:   . Frequency of Social Gatherings with Friends and Family:   . Attends Religious Services:   . Active Member of Clubs or Organizations:   . Attends Archivist Meetings:   Marland Kitchen Marital Status:   Intimate Partner Violence:   . Fear of Current or Ex-Partner:   . Emotionally Abused:   Marland Kitchen Physically Abused:   . Sexually Abused:    Review of Systems No fever--but up to 99.6 No sweats or chills Had both Pfizer COVID vaccines    Objective:   Physical Exam  Constitutional: She appears well-developed. No distress.  Respiratory: Effort normal. No respiratory distress.  Psychiatric: She has a normal mood and affect. Her behavior is normal.           Assessment & Plan:

## 2019-09-12 DIAGNOSIS — D3A098 Benign carcinoid tumors of other sites: Secondary | ICD-10-CM | POA: Diagnosis not present

## 2019-10-04 DIAGNOSIS — D3A098 Benign carcinoid tumors of other sites: Secondary | ICD-10-CM | POA: Diagnosis not present

## 2019-10-05 ENCOUNTER — Telehealth: Payer: Self-pay

## 2019-10-05 NOTE — Telephone Encounter (Signed)
It seems reasonable to wait for appt tomorrow

## 2019-10-05 NOTE — Telephone Encounter (Signed)
I received a call from [place] stating that patient was advised to go to the ER for Vertigo, due to history of high blood pressure - but patient refuses and only wants to see Dr. [name]. I spoke with patient and she states her BP is 162/82, and her heart rate is 78. I did schedule patient for an appt at 3:30 tomorrow with Dr.Letvak. ER precautions given.

## 2019-10-05 NOTE — Telephone Encounter (Signed)
[name] - Client TELEPHONE ADVICE RECORD [facility] Patient Name: Regina Chapman Gender: Female DOB: December 16, 1950 Age: 69 Y 31 M 23 D Return Phone Number: [phone] (Primary) Address: City/State/Zip: [place] Client [place] [facility] - Client Client Site [place] - Day Physician Viviana Simpler - MD Contact Type Call Who Is Calling Patient / Member / Family / Caregiver Call Type Triage / Clinical Relationship To Patient Self Return Phone Number [phone] (Primary) Chief Complaint Dizziness Reason for Call Symptomatic / Request for [place] states that she is experiencing dizziness. She has had problems with her ears, as well as a sinus infection. Translation No Nurse Assessment Nurse: Rock Nephew, RN, Juliann Pulse Date/Time (Eastern Time): 10/05/2019 11:46:00 AM Confirm and document reason for call. If symptomatic, describe symptoms. ---Caller states that she is experiencing dizziness. She has had problems with her ears, as well as a sinus infection ( was treated 2 weeks ago and finished abx ) . Has the patient had close contact with a person known or suspected to have the novel coronavirus illness OR traveled / lives in area with major community spread (including international travel) in the last 14 days from the onset of symptoms? * If Asymptomatic, screen for exposure and travel within the last 14 days. ---No Does the patient have any new or worsening symptoms? ---Yes Will a triage be completed? ---Yes Related visit to physician within the last 2 weeks? ---Yes Does the PT have any chronic conditions? (i.e. diabetes, asthma, this includes High risk factors for pregnancy, etc.) ---Yes List chronic conditions. ---HTN Is this a behavioral health or substance abuse call? ---No Guidelines Guideline Title Affirmed Question Affirmed Notes Nurse Date/Time (Eastern Time) Dizziness -  Vertigo [1] Dizziness (vertigo) present now AND [2] one or more STROKE RISK FACTORS (i.e., hypertension, diabetes, prior stroke/TIA, heart Wells, RN, Juliann Pulse 10/05/2019 11:47:09 AMPLEASE NOTE: All timestamps contained within this report are represented as [place] Standard Time. CONFIDENTIALTY NOTICE: This fax transmission is intended only for the addressee. It contains information that is legally privileged, confidential or otherwise protected from use or disclosure. If you are not the intended recipient, you are strictly prohibited from reviewing, disclosing, copying using or disseminating any of this information or taking any action in reliance on or regarding this information. If you have received this fax in error, please notify us immediately by telephone so that we can arrange for its return to [place]. Phone: [phone], Toll-Free: [phone], Fax: [phone] Page: 2 of 2 Call Id: [id number] Guidelines Guideline Title Affirmed Question Affirmed Notes Nurse Date/Time [name] Time) attack) (Exception: prior physician evaluation for this AND no different/ worse than usual) Disp. Time [name] Time) Disposition Final User 10/05/2019 11:53:14 AM Go to ED Now (or PCP triage) Yes Rock Nephew, RN, Gara Kroner Disagree/Comply Disagree Caller Understands Yes PreDisposition Call Doctor Care Advice Given Per Guideline GO TO ED NOW (OR PCP TRIAGE): * IF NO PCP (PRIMARY CARE PROVIDER) SECOND-LEVEL TRIAGE: You need to be seen within the next hour. Go to the [place] at _____________ [place] as soon as you can. * Another adult should drive. CARE ADVICE given per Dizziness - Vertigo (Adult) guideline. Comments User: [name], RN Date/Time [name] Time): 10/05/2019 12:01:03 PM I called the office back line and spoke with Jerene Pitch. I advised her of patient's refusal to go to ER , she wants to see PCP instead. Referrals GO TO FACILITY REFUSED

## 2019-10-06 ENCOUNTER — Other Ambulatory Visit: Payer: Self-pay

## 2019-10-06 ENCOUNTER — Ambulatory Visit (INDEPENDENT_AMBULATORY_CARE_PROVIDER_SITE_OTHER): Payer: Medicare Other | Admitting: Internal Medicine

## 2019-10-06 ENCOUNTER — Encounter: Payer: Self-pay | Admitting: Internal Medicine

## 2019-10-06 VITALS — BP 138/82 | HR 79 | Temp 98.3°F | Ht 61.5 in | Wt 176.8 lb

## 2019-10-06 DIAGNOSIS — I10 Essential (primary) hypertension: Secondary | ICD-10-CM

## 2019-10-06 DIAGNOSIS — C7B00 Secondary carcinoid tumors, unspecified site: Secondary | ICD-10-CM | POA: Diagnosis not present

## 2019-10-06 DIAGNOSIS — R42 Dizziness and giddiness: Secondary | ICD-10-CM

## 2019-10-06 MED ORDER — MECLIZINE HCL 25 MG PO TABS: 25.0000 mg | ORAL_TABLET | Freq: Three times a day (TID) | ORAL | 2 refills | Status: DC | PRN

## 2019-10-06 NOTE — Assessment & Plan Note (Signed)
BP Readings from Last 3 Encounters:  10/06/19 138/82  09/07/19 (!) 145/86  02/16/19 132/70   Has had high pressures upon first taking Did come down at [facility] and here Could be related to the carcinoid also Will continue losartan and HCTZ

## 2019-10-06 NOTE — Assessment & Plan Note (Signed)
Gets monthly injections at [facility] every 6 months

## 2019-10-06 NOTE — Assessment & Plan Note (Addendum)
Clearly seems to be vestibular Doesn't seem to have any signs to suggest CVA No inciting events and already takes HCTZ Will give meclizine for prn use Consider ENT evaluation if worsens

## 2019-10-06 NOTE — Progress Notes (Signed)
Subjective:    Patient ID: Regina Chapman, female    DOB: 1950-09-18, 69 y.o.   MRN: [id number]  HPI Here due to dizziness This visit occurred during the SARS-CoV-2 public health emergency.  Safety protocols were in place, including screening questions prior to the visit, additional usage of staff PPE, and extensive cleaning of exam room while observing appropriate contact time as indicated for disinfecting solutions.   Woke 4 mornings ago--very dizzy Had to hold on to make it to the bathroom Eased up by the afternoon Went to [facility] for monthly injection---BP initially high, then better Yesterday, had severe symptoms when dentist laid her back No true spinning--but had to take a break and felt nauseated  She feels it is from her ears Did get over the sinus symptoms from last month Occasional ear pain  Some symptoms if turns in bed Not with exertion  Current Outpatient Medications on File Prior to Visit  Medication Sig Dispense Refill  . aspirin 81 MG tablet Take 81 mg by mouth 2 (two) times a week.     . Biotin 10000 MCG TABS Take 1 tablet by mouth daily.    Marland Kitchen buPROPion (WELLBUTRIN SR) 150 MG 12 hr tablet Take 1 tablet (150 mg total) by mouth 2 (two) times daily. 180 tablet 3  . cholecalciferol (VITAMIN D) 1000 units tablet Take 1,000 Units by mouth daily.    . hydrochlorothiazide (HYDRODIURIL) 25 MG tablet TAKE 1 TABLET(25 MG) BY MOUTH DAILY 90 tablet 3  . loratadine (CLARITIN) 10 MG tablet Take 1 tablet (10 mg total) by mouth daily. 30 tablet 5  . losartan (COZAAR) 100 MG tablet TAKE 1 TABLET(100 MG) BY MOUTH DAILY 90 tablet 3  . Omega 3 1000 MG CAPS Take 2 capsules by mouth daily.    . potassium citrate (UROCIT-K) 10 MEQ (1080 MG) SR tablet Take 20 mEq by mouth 2 (two) times daily.    . traZODone (DESYREL) 50 MG tablet TAKE 1 TO 2 TABLETS(50 TO 100 MG) BY MOUTH AT BEDTIME AS NEEDED 180 tablet 3   No current facility-administered medications on file prior to visit.     Allergies  Allergen Reactions  . Augmentin [Amoxicillin-Pot Clavulanate] Nausea And Vomiting    Able to tolerate Amoxil  . Cortisone Other (See Comments)    Pt had depomedrol inj and next day had red face,puffiness in face and slight swelling in face; no SOB or swelling in mouth,tongue or throat.    Past Medical History:  Diagnosis Date  . Cancer ([place])    Melanoma  . 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 Depression   . History of melanoma 1985  . Hyperlipidemia   . Hypertension   . Kidney stones   . Obesity   . Obesity   . Psoriasis     Past Surgical History:  Procedure Laterality Date  . ABDOMINAL HYSTERECTOMY    . CATARACT EXTRACTION W/ INTRAOCULAR LENS  IMPLANT, BILATERAL  2013  . COLOSTOMY  2007  . LAPAROSCOPIC ASSISTED VAGINAL HYSTERECTOMY  1987   FOR ENDOMETRIOSIS  . LUNG REMOVAL, PARTIAL    . MELANOMA EXCISION  1985  . OOPHORECTOMY      Family History  Problem Relation Age of Onset  . Cervical cancer Sister        CERVICAL   . Heart disease Mother   . Dementia Mother   . Breast cancer Other   . Diabetes Neg Hx   . Hypertension Neg Hx   .  Colon cancer Neg Hx     Social History   Socioeconomic History  . Marital status: Widowed    Spouse name: Not on file  . Number of children: 1  . Years of education: Not on file  . Highest education level: Not on file  Occupational History  . Occupation: Radiographer, therapeutic service    Comment: Retired 2013  Tobacco Use  . Smoking status: Never Smoker  . Smokeless tobacco: Never Used  Substance and Sexual Activity  . Alcohol use: Yes    Alcohol/week: 3.0 standard drinks    Types: 3 Glasses of wine per week    Comment: 3 PER WEEK  . Drug use: No  . Sexual activity: Never    Birth control/protection: Surgical  Other Topics Concern  . Not on file  Social History Narrative   No living will or health care POA   Would want son to make decisions   Would accept resuscitation but no prolonged ventilation    No tube feeds if cognitively unaware   Social Determinants of Health   Financial Resource Strain:   . Difficulty of Paying Living Expenses:   Food Insecurity:   . Worried About Charity fundraiser in the Last Year:   . Arboriculturist in the Last Year:   Transportation Needs:   . Film/video editor (Medical):   Marland Kitchen Lack of Transportation (Non-Medical):   Physical Activity:   . Days of Exercise per Week:   . Minutes of Exercise per Session:   Stress:   . Feeling of Stress :   Social Connections:   . Frequency of Communication with Friends and Family:   . Frequency of Social Gatherings with Friends and Family:   . Attends Religious Services:   . Active Member of Clubs or Organizations:   . Attends Archivist Meetings:   Marland Kitchen Marital Status:   Intimate Partner Violence:   . Fear of Current or Ex-Partner:   . Emotionally Abused:   Marland Kitchen Physically Abused:   . Sexually Abused:    Review of Systems  No tinnitus No headache No hearing loss No focal weakness, facial droop, speech problems No recent travel or change in eating,etc     Objective:   Physical Exam  Constitutional: She is oriented to person, place, and time.  HENT:  Right Ear: Tympanic membrane and ear canal normal.  Left Ear: Tympanic membrane and ear canal normal.  Eyes:  No nystagmus  Cardiovascular: Normal rate and regular rhythm. Exam reveals no gallop.  No murmur heard. Respiratory: Effort normal and breath sounds normal.  GI: Normal appearance.  Musculoskeletal:     Cervical back: Neck supple.  Neurological: She is alert and oriented to person, place, and time. She displays no weakness. No cranial nerve deficit. Coordination and gait normal.  Skin: No rash noted.           Assessment & Plan:

## 2019-10-19 DIAGNOSIS — R42 Dizziness and giddiness: Secondary | ICD-10-CM

## 2019-11-02 ENCOUNTER — Ambulatory Visit: Payer: Medicare Other

## 2019-11-02 ENCOUNTER — Other Ambulatory Visit: Payer: Self-pay

## 2019-11-02 ENCOUNTER — Telehealth: Payer: Self-pay

## 2019-11-02 DIAGNOSIS — F3341 Major depressive disorder, recurrent, in partial remission: Secondary | ICD-10-CM

## 2019-11-02 DIAGNOSIS — E785 Hyperlipidemia, unspecified: Secondary | ICD-10-CM

## 2019-11-02 DIAGNOSIS — I10 Essential (primary) hypertension: Secondary | ICD-10-CM

## 2019-11-02 NOTE — Telephone Encounter (Signed)
PCP consult regarding CCM visit 11/02/19:  Patient reports worsening mood, more easily irritated and anxious. Denies change in medication adherence, refills timely. She asked about increasing her bupropion. We discussed increasing her bupropion 12 hr from 150 mg BID to 200 mg BID until she sees you next month for further evaluation pending PCP consult.   I would also like to reassess her lipids. Last lipid panel 2016, LDL 131. Could we order a lipid panel prior to or during AWV in August?  Thanks,   Debbora Dus, PharmD Clinical Pharmacist [facility] [phone]

## 2019-11-02 NOTE — Chronic Care Management (AMB) (Signed)
Chronic Care Management Pharmacy  Name: Regina Chapman  MRN: [id number] DOB: 1950/10/03  Chief Complaint/ HPI  Regina Chapman,  69 y.o., female presents for their Follow-Up CCM visit with the clinical pharmacist via telephone.  PCP : [name], MD  Their chronic conditions include: hypertension, hyperlipidemia, depression  Patient concerns/recent health changes: Reports vertigo is a lot better. Hearing test was clear. She is taking meclizine PRN and is it effective.   Last CCM 08/03/19: coordinated medication sync through [facility] with adherence packaging, discontinued OTC aspirin 81 mg as patient was only taking once weekly for primary prevention, recommend Shingrix, low cholesterol diet   Office Visits:   10/06/18: [name] - Vertigo, rx for meclizine PRN, ENT referral   09/07/19: [name] - Pansinusitis, rx for amoxicillin  12/07/18: [name] - increase bupropion to BID, add resistance training, Shingrix   Consult Visit:  05/19/19: chronic pain - no note available  05/17/19: benign tumor - no note available  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 Augmentin [Amoxicillin-Pot Clavulanate] Nausea And Vomiting    Able to tolerate Amoxil  . Cortisone Other (See Comments)    Pt had depomedrol inj and next day had red face,puffiness in face and slight swelling in face; no SOB or swelling in mouth,tongue or throat.   Medications: Outpatient Encounter Medications as of 11/02/2019  Medication Sig  . Biotin 10000 MCG TABS Take 1 tablet by mouth daily.  Marland Kitchen buPROPion (WELLBUTRIN SR) 150 MG 12 hr tablet Take 1 tablet (150 mg total) by mouth 2 (two) times daily.  . cholecalciferol (VITAMIN D) 1000 units tablet Take 1,000 Units by mouth daily.  . hydrochlorothiazide (HYDRODIURIL) 25 MG tablet TAKE 1 TABLET(25 MG) BY MOUTH DAILY  . loratadine (CLARITIN) 10 MG tablet Take 1 tablet (10 mg total) by mouth  daily.  Marland Kitchen losartan (COZAAR) 100 MG tablet TAKE 1 TABLET(100 MG) BY MOUTH DAILY  . meclizine (ANTIVERT) 25 MG tablet Take 1 tablet (25 mg total) by mouth 3 (three) times daily as needed for dizziness.  . Omega 3 1000 MG CAPS Take 2 capsules by mouth daily.  . potassium citrate (UROCIT-K) 10 MEQ (1080 MG) SR tablet Take 20 mEq by mouth 2 (two) times daily.  . traZODone (DESYREL) 50 MG tablet TAKE 1 TO 2 TABLETS(50 TO 100 MG) BY MOUTH AT BEDTIME AS NEEDED  . aspirin 81 MG tablet Take 81 mg by mouth 2 (two) times a week.  (Patient not taking: Reported on 11/02/2019)   No facility-administered encounter medications on file as of 11/02/2019.   Current Diagnosis/Assessment:  Goals    . Pharmacy Care Plan     CARE PLAN ENTRY  Current Barriers:  . Chronic Disease Management support, education, and care coordination needs related to Hypertension, Hyperlipidemia, and Depression   Hypertension BP Readings from Last 3 Encounters:  10/06/19 138/82  09/07/19 (!) 145/86  02/16/19 132/70 .  Pharmacist Clinical Goal(s): o Over the next 3 months, patient will work with PharmD and providers to maintain BP goal <140/90 mmHg . Current regimen:   Losartan 100 mg - 1 tablet daily (AM)  Hydrochlorothiazide 25 mg - 1 tablet daily (AM) . Interventions: o Reviewed home blood pressure monitoring log, BP is within goal . Patient self care activities - Over the next 3 months, patient will: o Continue to check BP several days per week, document, and provide at future appointments  o Ensure daily salt intake < 2300 mg/day  Hyperlipidemia Lab Results  Component Value Date/Time   LDLCALC 131 (H) 04/04/2015 09:36 AM   LDLDIRECT 183.9 02/09/2013 11:33 AM .  Pharmacist Clinical Goal(s): o Over the next 3 months, patient will work with PharmD and providers to achieve LDL goal < 100 . Current regimen:  o Omega 3 fatty acids (EPA/DHA) 1200 mg - 2 capsules once daily   Interventions: o Recommend updating lipid panel  at AWV . Patient self care activities - Over the next 3 months, patient will: . Slowly increase exercise with goal of 30 minutes, 5 days per week as tolerated . Incorporate a healthy diet high in vegetables, fruits and whole grains with low-fat dairy products, chicken, fish, legumes, non-tropical vegetable oils and nuts. Limit intake of sweets, sugar-sweetened beverages and red meats.  Depression . Pharmacist Clinical Goal(s) o Over the next 3 months, patient will work with PharmD and providers to improve irritability . Current regimen:   Bupropion 150 mg 12 hr - 1 tablet twice daily  Trazodone 50 mg - 1-2 tablets at bedtime as needed for sleep . Interventions: o Reviewed current symptom control, patient reports need for improvement in irritability  o Consult with PCP for medication management, increase bupropion  . Patient self care activities - Over the next 3 months, patient will: o Increase bupropion dose. New dose will be delivered through [facility]. o Continue trazodone as needed o Follow up with Dr. [name] in August   Please see past updates related to this goal by clicking on the "Past Updates" button in the selected goal       Hypertension   CMP completed at [facility] 05/04/19 - all WNL except elevated glucose - 143 CMP Latest Ref Rng & Units 07/07/2017 06/22/2017 09/26/2016  Glucose 70 - 99 mg/dL 97 95 91  BUN 6 - 23 mg/dL 16 12 19   Creatinine 0.40 - 1.20 mg/dL 0.77 0.78 0.89  Sodium 135 - 145 mEq/L 138 139 140  Potassium 3.5 - 5.1 mEq/L 4.1 4.1 4.2  Chloride 96 - 112 mEq/L 99 100 103  CO2 19 - 32 mEq/L 32 31 30  Calcium 8.4 - 10.5 mg/dL 10.4 10.8(H) 10.2  Total Protein 6.0 - 8.3 g/dL - 7.9 7.8  Total Bilirubin 0.2 - 1.2 mg/dL - 0.5 0.5  Alkaline Phos 39 - 117 U/L - 58 53  AST 0 - 37 U/L - 19 21  ALT 0 - 35 U/L - 24 19   Office blood pressures are: BP Readings from Last 3 Encounters:  10/06/19 138/82  09/07/19 (!) 145/86  02/16/19 132/70   BP goal < 140/90 mmHg Patient  has failed these meds in the past: none Patient checks BP at home 3-5x per week Patient home BP readings are ranging: 135/70-75 mmHg, HR normal range    Patient is currently controlled on the following medications:   Losartan 100 mg - 1 tablet daily (AM)  Hydrochlorothiazide 25 mg - 1 tablet daily (AM)  We discussed: refills timely, meds are being synced through [facility]  Plan: Continue current medications  Hyperlipidemia   CBC Latest Ref Rng & Units 06/22/2017 09/26/2016 01/20/2014  WBC 4.0 - 10.5 K/uL 9.8 7.7 8.8  Hemoglobin 12.0 - 15.0 g/dL 14.8 15.3(H) 15.6(H)  Hematocrit 36 - 46 % 43.2 45.8 46.4(H)  Platelets 150 - 400 K/uL 290.0 259.0 233.0   Lipid Panel     Component Value Date/Time   CHOL 187 04/04/2015 0936  TRIG 106.0 04/04/2015 0936   HDL 34.90 (L) 04/04/2015 0936   CHOLHDL 5 04/04/2015 0936   VLDL 21.2 04/04/2015 0936   LDLCALC 131 (H) 04/04/2015 0936   LDLDIRECT 183.9 02/09/2013 1133    Last lipid panel: 2016 LDL goal < 100 Patient has failed these meds in past: none Patient is currently uncontrolled on the following medications:   Omega 3 fatty acids (EPA/DHA) 1200 mg - 2 capsules once daily    We discussed: prefers not to add additional medication, due for updated lipid panel   Plan: Continue current medications; Recommend lipid panel with annual visit. Recommend dietary and exercise goals.  Depression    Patient has failed these meds in past: none  Patient is currently controlled on the following medications:   Bupropion 150 mg 12 hr - 1 tablet BID   Trazodone 50 mg - 1-2 tablets qhs   We discussed: Sleep - has reduced frequency of trazodone due to sleeping well Mood - reports feeling frustrated easily, feels like she needs to increase bupropion; last dose change in August 2020 (increased from 150 mg once daily to BID); feeling more depressed and anxious. Denies failed therapies in the past. Next PCP visit is August 2021.  Plan: Continue current  medications; Consult with PCP to discuss bupropion dose increase.  Kidney Stones   Patient has failed these meds in past: potassium citrate (cost - $50+) Patient is currently controlled on the following medications:   Potassium bicarbonate effervescent tablet 25 mEq - 1 tablet daily mixed with glass of water   We discussed: Sees nephrologist at [place] [facility]. After last visit, tried switching patient back to potassium citrate due to taste/difficulty swallowing potassium bicarbonate but cost was too high. Pt has tried different flavors of the potassium bicarb and still unable to tolerate. She continues potassium bicarbonate but only taking 2-3 days per week. She has discussed concerns with urology.  Plan: Continue current medications   Vaccines   Reviewed and discussed patient's vaccination history. Previously recommended patient receive Shingrix (reports cost - $175). Unable to afford vaccine. Reports received both doses of COVID-19 vaccine.   Immunization History  Administered Date(s) Administered  . Influenza, High Dose Seasonal PF 04/29/2016, 03/03/2017, 01/06/2019  . Influenza, Seasonal, Injecte, Preservative Fre 01/23/2015  . Influenza,inj,Quad PF,6+ Mos 02/09/2013, 01/20/2014  . Influenza-Unspecified 02/11/2012, 02/22/2018  . Pneumococcal Conjugate-13 07/20/2015  . Pneumococcal Polysaccharide-23 09/26/2016  . Tdap 02/09/2013   Plan: Remain up to date on vaccinations.  Medication Management  OTCs: loratadine 10 mg - PRN (hasn't needed in a long time), biotin 10000 mcg - 1 daily, vitamin D 1000 IU - 2 tablets   Pharmacy/Benefits: [facility]  Adherence: denies missed doses   Social support: lives alone, independent ADLs, son lives nearby  Affordability: denies concerns  CCM Follow Up:  3 months, telephone  Debbora Dus, PharmD Clinical Pharmacist [facility] at [facility] [phone]

## 2019-11-02 NOTE — Patient Instructions (Addendum)
Dear Regina Chapman,  Below is a summary of the goals we discussed during our follow up appointment on November 02, 2019. Please contact me anytime with questions or concerns.   Visit Information  Goals    . Pharmacy Care Plan     CARE PLAN ENTRY  Current Barriers:  . Chronic Disease Management support, education, and care coordination needs related to Hypertension, Hyperlipidemia, and Depression   Hypertension BP Readings from Last 3 Encounters:  10/06/19 138/82  09/07/19 (!) 145/86  02/16/19 132/70 .  Pharmacist Clinical Goal(s): o Over the next 3 months, patient will work with PharmD and providers to maintain BP goal <140/90 mmHg . Current regimen:   Losartan 100 mg - 1 tablet daily (AM)  Hydrochlorothiazide 25 mg - 1 tablet daily (AM) . Interventions: o Reviewed home blood pressure monitoring log, BP is within goal . Patient self care activities - Over the next 3 months, patient will: o Continue to check BP several days per week, document, and provide at future appointments o Ensure daily salt intake < 2300 mg/day  Hyperlipidemia Lab Results  Component Value Date/Time   LDLCALC 131 (H) 04/04/2015 09:36 AM   LDLDIRECT 183.9 02/09/2013 11:33 AM .  Pharmacist Clinical Goal(s): o Over the next 3 months, patient will work with PharmD and providers to achieve LDL goal < 100 . Current regimen:  o Omega 3 fatty acids (EPA/DHA) 1200 mg - 2 capsules once daily   Interventions: o Recommend updating lipid panel at [facility] . Patient self care activities - Over the next 3 months, patient will: . Slowly increase exercise with goal of 30 minutes, 5 days per week as tolerated . Incorporate a healthy diet high in vegetables, fruits and whole grains with low-fat dairy products, chicken, fish, legumes, non-tropical vegetable oils and nuts. Limit intake of sweets, sugar-sweetened beverages and red meats.  Depression . Pharmacist Clinical Goal(s) o Over the next 3 months, patient will work with  PharmD and providers to improve irritability . Current regimen:   Bupropion 150 mg 12 hr - 1 tablet twice daily  Trazodone 50 mg - 1-2 tablets at bedtime as needed for sleep . Interventions: o Reviewed current symptom control, patient reports need for improvement in irritability  o Consult with PCP for medication management, increase bupropion  . Patient self care activities - Over the next 3 months, patient will: o Increase bupropion dose. New dose will be delivered through [facility]. o Continue trazodone as needed o Follow up with Dr. [name] in August   Please see past updates related to this goal by clicking on the "Past Updates" button in the selected goal        The patient verbalized understanding of instructions provided today and agreed to receive a mailed copy of patient instruction and/or educational materials.  Telephone follow up appointment with pharmacy team member scheduled for: 02/06/20 at 9:00 AM (telephone)  Debbora Dus, PharmD Clinical Pharmacist [facility]   [place] refers to food and lifestyle choices that are based on the traditions of countries located on the [place]. This way of eating has been shown to help prevent certain conditions and improve outcomes for people who have chronic diseases, like kidney disease and heart disease. What are tips for following this plan? Lifestyle  Cook and eat meals together with your family, when possible.  Drink enough fluid to keep your urine clear or pale yellow.  Be physically active every  day. This includes: ? Aerobic exercise like running or swimming. ? Leisure activities like gardening, walking, or housework.  Get 7-8 hours of sleep each night.  If recommended by your health care provider, drink red wine in moderation. This means 1 glass a day for nonpregnant women and 2 glasses a day for men. A glass of wine equals 5 oz (150  mL). Reading food labels   Check the serving size of packaged foods. For foods such as rice and pasta, the serving size refers to the amount of cooked product, not dry.  Check the total fat in packaged foods. Avoid foods that have saturated fat or trans fats.  Check the ingredients list for added sugars, such as corn syrup. Shopping  At the grocery store, buy most of your food from the areas near the walls of the store. This includes: ? Fresh fruits and vegetables (produce). ? Grains, beans, nuts, and seeds. Some of these may be available in unpackaged forms or large amounts (in bulk). ? Fresh seafood. ? Poultry and eggs. ? Low-fat dairy products.  Buy whole ingredients instead of prepackaged foods.  Buy fresh fruits and vegetables in-season from local farmers markets.  Buy frozen fruits and vegetables in resealable bags.  If you do not have access to quality fresh seafood, buy precooked frozen shrimp or canned fish, such as tuna, salmon, or sardines.  Buy small amounts of raw or cooked vegetables, salads, or olives from the deli or salad bar at your store.  Stock your pantry so you always have certain foods on hand, such as olive oil, canned tuna, canned tomatoes, rice, pasta, and beans. Cooking  Cook foods with extra-virgin olive oil instead of using butter or other vegetable oils.  Have meat as a side dish, and have vegetables or grains as your main dish. This means having meat in small portions or adding small amounts of meat to foods like pasta or stew.  Use beans or vegetables instead of meat in common dishes like chili or lasagna.  Experiment with different cooking methods. Try roasting or broiling vegetables instead of steaming or sauteing them.  Add frozen vegetables to soups, stews, pasta, or rice.  Add nuts or seeds for added healthy fat at each meal. You can add these to yogurt, salads, or vegetable dishes.  Marinate fish or vegetables using olive oil, lemon  juice, garlic, and fresh herbs. Meal planning   Plan to eat 1 vegetarian meal one day each week. Try to work up to 2 vegetarian meals, if possible.  Eat seafood 2 or more times a week.  Have healthy snacks readily available, such as: ? Vegetable sticks with hummus. ? [place] yogurt. ? Fruit and nut trail mix.  Eat balanced meals throughout the week. This includes: ? Fruit: 2-3 servings a day ? Vegetables: 4-5 servings a day ? Low-fat dairy: 2 servings a day ? Fish, poultry, or lean meat: 1 serving a day ? Beans and legumes: 2 or more servings a week ? Nuts and seeds: 1-2 servings a day ? Whole grains: 6-8 servings a day ? Extra-virgin olive oil: 3-4 servings a day  Limit red meat and sweets to only a few servings a month What are my food choices?  Mediterranean diet ? Recommended  Grains: Whole-grain pasta. Brown rice. Bulgar wheat. Polenta. Couscous. Whole-wheat bread. [place].  Vegetables: Artichokes. Beets. Broccoli. Cabbage. Carrots. Eggplant. Green beans. Chard. Kale. Spinach. Onions. Leeks. Peas. Squash. Tomatoes. Peppers. Radishes.  Fruits: Apples. Apricots. Avocado. Berries.  Bananas. Cherries. Dates. Figs. Grapes. Lemons. Melon. Oranges. Peaches. Plums. Pomegranate.  Meats and other protein foods: Beans. Almonds. Sunflower seeds. Pine nuts. Peanuts. [place]. Salmon. Scallops. Shrimp. [place]. Tilapia. Clams. Oysters. Eggs.  Dairy: Low-fat milk. Cheese. Greek yogurt.  Beverages: Water. Red wine. Herbal tea.  Fats and oils: Extra virgin olive oil. Avocado oil. Grape seed oil.  Sweets and desserts: [place] yogurt with honey. Baked apples. Poached pears. Trail mix.  Seasoning and other foods: Basil. Cilantro. Coriander. Cumin. Mint. Parsley. Sage. Rosemary. Tarragon. Garlic. Oregano. Thyme. Pepper. Balsalmic vinegar. Tahini. Hummus. Tomato sauce. Olives. Mushrooms. ? Limit these  Grains: Prepackaged pasta or rice dishes. Prepackaged cereal with added  sugar.  Vegetables: Deep fried potatoes (french fries).  Fruits: Fruit canned in syrup.  Meats and other protein foods: Beef. Pork. Lamb. Poultry with skin. Hot dogs. Bernie[place] Salines.  Dairy: Ice cream. Sour cream. Whole milk.  Beverages: Juice. Sugar-sweetened soft drinks. Beer. Liquor and spirits.  Fats and oils: Butter. Canola oil. Vegetable oil. Beef fat (tallow). Lard.  Sweets and desserts: Cookies. Cakes. Pies. Candy.  Seasoning and other foods: Mayonnaise. Premade sauces and marinades. The items listed may not be a complete list. Talk with your dietitian about what dietary choices are right for you. Summary  The Mediterranean diet includes both food and lifestyle choices.  Eat a variety of fresh fruits and vegetables, beans, nuts, seeds, and whole grains.  Limit the amount of red meat and sweets that you eat.  Talk with your health care provider about whether it is safe for you to drink red wine in moderation. This means 1 glass a day for nonpregnant women and 2 glasses a day for men. A glass of wine equals 5 oz (150 mL). This information is not intended to replace advice given to you by your health care provider. Make sure you discuss any questions you have with your health care provider. Document Revised: 12/13/2015 Document Reviewed: 12/06/2015 Elsevier Patient Education  [place].

## 2019-11-03 NOTE — Telephone Encounter (Signed)
Okay to increase the bupropion to 200mg  bid (Shannon---please take care of this)  We can discuss the cholesterol at her visit and I can recheck at that time

## 2019-11-03 NOTE — Telephone Encounter (Signed)
Left message for pt to call office

## 2019-11-08 DIAGNOSIS — N200 Calculus of kidney: Secondary | ICD-10-CM | POA: Diagnosis not present

## 2019-11-08 MED ORDER — BUPROPION HCL ER (SR) 200 MG PO TB12: 200.0000 mg | ORAL_TABLET | Freq: Two times a day (BID) | ORAL | 3 refills | Status: DC

## 2019-11-08 NOTE — Telephone Encounter (Signed)
Spoke to pt. She would like to try the increase 200mg . I have sent the rx to the pharmacy.

## 2019-12-09 ENCOUNTER — Other Ambulatory Visit: Payer: Self-pay

## 2019-12-09 ENCOUNTER — Ambulatory Visit (INDEPENDENT_AMBULATORY_CARE_PROVIDER_SITE_OTHER): Payer: Medicare Other | Admitting: Internal Medicine

## 2019-12-09 ENCOUNTER — Encounter: Payer: Self-pay | Admitting: Internal Medicine

## 2019-12-09 VITALS — BP 116/70 | HR 73 | Temp 97.1°F | Ht 61.5 in | Wt 176.0 lb

## 2019-12-09 DIAGNOSIS — Z0000 Encounter for general adult medical examination without abnormal findings: Secondary | ICD-10-CM | POA: Diagnosis not present

## 2019-12-09 DIAGNOSIS — C7B00 Secondary carcinoid tumors, unspecified site: Secondary | ICD-10-CM | POA: Diagnosis not present

## 2019-12-09 DIAGNOSIS — N200 Calculus of kidney: Secondary | ICD-10-CM | POA: Diagnosis not present

## 2019-12-09 DIAGNOSIS — F3341 Major depressive disorder, recurrent, in partial remission: Secondary | ICD-10-CM

## 2019-12-09 DIAGNOSIS — Z7189 Other specified counseling: Secondary | ICD-10-CM | POA: Diagnosis not present

## 2019-12-09 DIAGNOSIS — I10 Essential (primary) hypertension: Secondary | ICD-10-CM | POA: Diagnosis not present

## 2019-12-09 NOTE — Assessment & Plan Note (Signed)
Taking OTC K-citrate

## 2019-12-09 NOTE — Assessment & Plan Note (Signed)
BP Readings from Last 3 Encounters:  12/09/19 116/70  10/06/19 138/82  09/07/19 (!) 145/86   Doing well on losartan Reviewed labs at [facility]

## 2019-12-09 NOTE — Assessment & Plan Note (Signed)
Mood better since increase in bupropion Will continue

## 2019-12-09 NOTE — Assessment & Plan Note (Signed)
Taking a break from the lanreotide Will decide about restarting in November

## 2019-12-09 NOTE — Progress Notes (Signed)
Hearing Screening   125Hz  250Hz  500Hz  1000Hz  2000Hz  3000Hz  4000Hz  6000Hz  8000Hz   Right ear:           Left ear:           Comments: June 2021  Vision Screening Comments: April 2021

## 2019-12-09 NOTE — Assessment & Plan Note (Signed)
See social history 

## 2019-12-09 NOTE — Assessment & Plan Note (Signed)
I have personally reviewed the Medicare Annual Wellness questionnaire and have noted 1. The patient's medical and social history 2. Their use of alcohol, tobacco or illicit drugs 3. Their current medications and supplements 4. The patient's functional ability including ADL's, fall risks, home safety risks and hearing or visual             impairment. 5. Diet and physical activities 6. Evidence for depression or mood disorders  The patients weight, height, BMI and visual acuity have been recorded in the chart I have made referrals, counseling and provided education to the patient based review of the above and I have provided the pt with a written personalized care plan for preventive services.  I have provided you with a copy of your personalized plan for preventive services. Please take the time to review along with your updated medication list.  Discussed fitness and exercise (add resistance) Flu vaccine in the fall Mammogram due again in 1 year Colon due again 8/22

## 2019-12-09 NOTE — Progress Notes (Signed)
Subjective:    Patient ID: Regina Chapman, female    DOB: 02/06/1951, 69 y.o.   MRN: [id number]  HPI Here for Medicare wellness visit and follow up of chronic health conditions This visit occurred during the SARS-CoV-2 public health emergency.  Safety protocols were in place, including screening questions prior to the visit, additional usage of staff PPE, and extensive cleaning of exam room while observing appropriate contact time as indicated for disinfecting solutions.   Reviewed form and advanced directives Reviewed other doctors Occasional drink of alcohol No tobacco Walking 2-3 times a week with neighbor Vision and hearing are fine No falls Chronic mood issues Independent with instrumental ADLs No sig memory issues  Continues with oncologist  Has decided to hold the lanreotide for a few months---due to possible side effects Will get re-imaged in November and then decide about restarting  Still with kidney stones No symptoms now Urocit very expensive---using OTC with K-citrate  No chest pain or SOB No dizziness or syncope----vertigo is gone now No palpitations Occasional headaches--nothing serious (?sinus) No edema  Depression is better since increase in bupropion No major anxiety Sleeps fairly well with the trazodone  Current Outpatient Medications on File Prior to Visit  Medication Sig Dispense Refill  . aspirin 81 MG tablet Take 81 mg by mouth 2 (two) times a week.      . Biotin 10000 MCG TABS Take 1 tablet by mouth daily.    Marland Kitchen buPROPion (WELLBUTRIN SR) 200 MG 12 hr tablet Take 1 tablet (200 mg total) by mouth 2 (two) times daily. 180 tablet 3  . cholecalciferol (VITAMIN D) 1000 units tablet Take 1,000 Units by mouth daily.    . hydrochlorothiazide (HYDRODIURIL) 25 MG tablet TAKE 1 TABLET(25 MG) BY MOUTH DAILY 90 tablet 3  . loratadine (CLARITIN) 10 MG tablet Take 1 tablet (10 mg total) by mouth daily. 30 tablet 5  . losartan (COZAAR) 100 MG tablet TAKE 1  TABLET(100 MG) BY MOUTH DAILY 90 tablet 3  . Omega 3 1000 MG CAPS Take 2 capsules by mouth daily.    . potassium citrate (UROCIT-K) 10 MEQ (1080 MG) SR tablet Take 20 mEq by mouth 2 (two) times daily.    . traZODone (DESYREL) 50 MG tablet TAKE 1 TO 2 TABLETS(50 TO 100 MG) BY MOUTH AT BEDTIME AS NEEDED 180 tablet 3  . meclizine (ANTIVERT) 25 MG tablet Take 1 tablet (25 mg total) by mouth 3 (three) times daily as needed for dizziness. (Patient not taking: Reported on 12/09/2019) 30 tablet 2   No current facility-administered medications on file prior to visit.    Allergies  Allergen Reactions  . Augmentin [Amoxicillin-Pot Clavulanate] Nausea And Vomiting    Able to tolerate Amoxil  . Cortisone Other (See Comments)    Pt had depomedrol inj and next day had red face,puffiness in face and slight swelling in face; no SOB or swelling in mouth,tongue or throat.    Past Medical History:  Diagnosis Date  . Cancer ([place])    Melanoma  . 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 Depression   . History of melanoma 1985  . Hyperlipidemia   . Hypertension   . Kidney stones   . Obesity   . Obesity   . Psoriasis     Past Surgical History:  Procedure Laterality Date  . ABDOMINAL HYSTERECTOMY    . CATARACT EXTRACTION W/ INTRAOCULAR LENS  IMPLANT, BILATERAL  2013  . COLOSTOMY  2007  .  LAPAROSCOPIC ASSISTED VAGINAL HYSTERECTOMY  1987   FOR ENDOMETRIOSIS  . LUNG REMOVAL, PARTIAL    . MELANOMA EXCISION  1985  . OOPHORECTOMY      Family History  Problem Relation Age of Onset  . Cervical cancer Sister        CERVICAL   . Heart disease Mother   . Dementia Mother   . Breast cancer Other   . Diabetes Neg Hx   . Hypertension Neg Hx   . Colon cancer Neg Hx     Social History   Socioeconomic History  . Marital status: Widowed    Spouse name: Not on file  . Number of children: 1  . Years of education: Not on file  . Highest education level: Not on file  Occupational History  . Occupation:  Radiographer, therapeutic service    Comment: Retired 2013  Tobacco Use  . Smoking status: Never Smoker  . Smokeless tobacco: Never Used  Substance and Sexual Activity  . Alcohol use: Yes    Alcohol/week: 3.0 standard drinks    Types: 3 Glasses of wine per week    Comment: 3 PER WEEK  . Drug use: No  . Sexual activity: Never    Birth control/protection: Surgical  Other Topics Concern  . Not on file  Social History Narrative   No living will or health care POA   Would want son to make decisions   Would accept resuscitation but no prolonged ventilation   No tube feeds if cognitively unaware   Social Determinants of Health   Financial Resource Strain:   . Difficulty of Paying Living Expenses:   Food Insecurity:   . Worried About Charity fundraiser in the Last Year:   . Arboriculturist in the Last Year:   Transportation Needs:   . Film/video editor (Medical):   Marland Kitchen Lack of Transportation (Non-Medical):   Physical Activity:   . Days of Exercise per Week:   . Minutes of Exercise per Session:   Stress:   . Feeling of Stress :   Social Connections:   . Frequency of Communication with Friends and Family:   . Frequency of Social Gatherings with Friends and Family:   . Attends Religious Services:   . Active Member of Clubs or Organizations:   . Attends Archivist Meetings:   Marland Kitchen Marital Status:   Intimate Partner Violence:   . Fear of Current or Ex-Partner:   . Emotionally Abused:   Marland Kitchen Physically Abused:   . Sexually Abused:    Review of Systems Appetite is good Weight is stable Wears seat belt Teeth okay--keeps up with dentist Chronic psoriasis---uses Rx cream No heartburn or dysphagia Bowels are slow at times---uses miralax fairly regularly Back pain is some better--had ESI through [facility]     Objective:   Physical Exam Constitutional:      Appearance: Normal appearance.  HENT:     Mouth/Throat:     Comments: No lesions Eyes:      Conjunctiva/sclera: Conjunctivae normal.     Pupils: Pupils are equal, round, and reactive to light.  Cardiovascular:     Rate and Rhythm: Normal rate and regular rhythm.     Pulses: Normal pulses.     Heart sounds: No murmur heard.  No gallop.   Pulmonary:     Effort: Pulmonary effort is normal.     Breath sounds: Normal breath sounds. No wheezing or rales.  Abdominal:  Palpations: Abdomen is soft.     Tenderness: There is no abdominal tenderness.  Musculoskeletal:     Cervical back: Neck supple.     Right lower leg: No edema.     Left lower leg: No edema.  Lymphadenopathy:     Cervical: No cervical adenopathy.  Neurological:     Mental Status: She is alert and oriented to person, place, and time.     Comments: President---"Biden, Trump, Obama" [id number] Recall 2/3  Psychiatric:        Mood and Affect: Mood normal.        Behavior: Behavior normal.            Assessment & Plan:

## 2020-01-04 ENCOUNTER — Other Ambulatory Visit: Payer: Self-pay | Admitting: Internal Medicine

## 2020-01-04 DIAGNOSIS — Z1231 Encounter for screening mammogram for malignant neoplasm of breast: Secondary | ICD-10-CM

## 2020-01-13 ENCOUNTER — Other Ambulatory Visit: Payer: Self-pay

## 2020-01-13 ENCOUNTER — Ambulatory Visit
Admission: RE | Admit: 2020-01-13 | Discharge: 2020-01-13 | Disposition: A | Discharge status: Home / Self Care | Payer: Medicare Other | Source: Ambulatory Visit | Attending: Internal Medicine | Admitting: Internal Medicine

## 2020-01-13 DIAGNOSIS — Z1231 Encounter for screening mammogram for malignant neoplasm of breast: Secondary | ICD-10-CM | POA: Insufficient documentation

## 2020-01-27 ENCOUNTER — Telehealth: Payer: Self-pay

## 2020-01-27 NOTE — Chronic Care Management (AMB) (Signed)
Chronic Care Management Pharmacy Assistant   Name: Regina Chapman  MRN: [id number] DOB: [birth year]  Reason for Encounter: Medication Review / Monthly Dispensing Call  PCP : [name], MD  Allergies:   Allergies  Allergen Reactions  . Augmentin [Amoxicillin-Pot Clavulanate] Nausea And Vomiting    Able to tolerate Amoxil  . Cortisone Other (See Comments)    Pt had depomedrol inj and next day had red face,puffiness in face and slight swelling in face; no SOB or swelling in mouth,tongue or throat.    Medications: Outpatient Encounter Medications as of 01/27/2020  Medication Sig  . aspirin 81 MG tablet Take 81 mg by mouth 2 (two) times a week.    . Biotin 10000 MCG TABS Take 1 tablet by mouth daily.  Marland Kitchen buPROPion (WELLBUTRIN SR) 200 MG 12 hr tablet Take 1 tablet (200 mg total) by mouth 2 (two) times daily.  . cholecalciferol (VITAMIN D) 1000 units tablet Take 1,000 Units by mouth daily.  . hydrochlorothiazide (HYDRODIURIL) 25 MG tablet TAKE 1 TABLET(25 MG) BY MOUTH DAILY  . loratadine (CLARITIN) 10 MG tablet Take 1 tablet (10 mg total) by mouth daily.  Marland Kitchen losartan (COZAAR) 100 MG tablet TAKE 1 TABLET(100 MG) BY MOUTH DAILY  . meclizine (ANTIVERT) 25 MG tablet Take 1 tablet (25 mg total) by mouth 3 (three) times daily as needed for dizziness. (Patient not taking: Reported on 12/09/2019)  . Omega 3 1000 MG CAPS Take 2 capsules by mouth daily.  . potassium citrate (UROCIT-K) 10 MEQ (1080 MG) SR tablet Take 20 mEq by mouth 2 (two) times daily.  . traZODone (DESYREL) 50 MG tablet TAKE 1 TO 2 TABLETS(50 TO 100 MG) BY MOUTH AT BEDTIME AS NEEDED   No facility-administered encounter medications on file as of 01/27/2020.    Current Diagnosis: Patient Active Problem List   Diagnosis Date Noted  . Vertigo 10/06/2019  . Kidney stones 12/07/2018  . Acute non-recurrent pansinusitis 10/08/2017  . Neuropathy 09/26/2016  . Advance directive discussed with patient 07/20/2015  .  Metastatic carcinoid tumor ([place]) 04/04/2015  . Obesity   . Routine general medical examination at a health care facility 02/09/2013  . Hyperlipidemia   . Hypertension   . 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  OVs- 12/09/2019- Dr Kara Pacer (PCP) since last care coordination call/Pharmacist visit.  No medication changes indicated.  BP Readings from Last 3 Encounters:  12/09/19 116/70  10/06/19 138/82  09/07/19 (!) 145/86    No results found for: HGBA1C   Patient obtains medications through Adherence Packaging  90 Days   Last adherence delivery include: None, Patient received a 90 day supply on 12/02/2019 for medications.   Patient declined the following medications last month: Trazodone 50 mg - 1 to 2 tablets nightly as needed (bedtime), Vitamin D3 50 mcg- 1 capsule daily (breakfast), Biotin 5 mg - 1 capsule daily (breakfast), Hydrochlorothiazide 25 mg- 1 tablet daily (breakfast), Losartan 100 mg- 1 tablet daily (breakfast), Bupropion SR 200 mg- 1 tablet twice daily (breakfast, bedtime), due to receiving a 90 day supply on 12/02/2019. Potassium Citrate 10 meq- 2 tablets twice daily (breakfast, evening meal), Omega 3 fish oil 360 mg- 2 tablets daily (breakfast) due to having an adequate supply on hand from [facility].   Patient is due for next adherence delivery on: 01/31/2020. Called 01/27/2020 by Wendy Poet,  CPA- no answer, left message to return call. Called 02/02/2020 by Wendy Poet, CPA- spoke patient.  Called patient and reviewed medications- No delivery needed.  This delivery to include: None- Patient received medications on 12/02/2019 for 90 day supply, next fill date is 03/01/2020.   Patient declined the following medications: Trazodone 50 mg - 1 to 2 tablets nightly as needed (bedtime), Vitamin D3 50 mcg- 1 capsule daily (breakfast), Biotin 5 mg - 1  capsule daily (breakfast), Hydrochlorothiazide 25 mg- 1 tablet daily (breakfast), Losartan 100 mg- 1 tablet daily (breakfast), Bupropion SR 200 mg- 1 tablet twice daily (breakfast, bedtime), due to receiving a 90 day supply on 12/02/2019. Potassium Citrate 10 meq- 2 tablets twice daily (breakfast, evening meal), Omega 3 fish oil 360 mg- 2 tablets daily (breakfast) due to having an adequate supply on hand from [facility].   Patient aware that we will call next month to review medications and coordinate delivery.   Follow-Up:  Coordination of Enhanced Pharmacy Services and Pharmacist Review Patient does not need any refills at this time. Donette Larry, CPP notified.  Pattricia Boss, [place] Pharmacist Assistant [phone]

## 2020-01-30 NOTE — Chronic Care Management (AMB) (Signed)
Chronic Care Management Pharmacy  Name: Regina Chapman  MRN: [id number] DOB: [birth year]  Chief Complaint/ HPI  Regina Chapman,  69 y.o., female presents for their Follow-Up CCM visit with the clinical pharmacist via telephone.  PCP : Regina Carbon, MD  Their chronic conditions include: hypertension, hyperlipidemia, depression  Patient concerns/recent health changes: Denies concerns at this time. Indicates medications going well with current regimen/pharmacy delivery. Following up with [facility] neurologist this afternoon.   Last CCM 08/03/19:  Reports vertigo is a lot better. Hearing test was clear. She is taking meclizine PRN and is it effective.   Office Visits:   8/13/212: Regina Chapman - routine general medical exam. No medication changes noted.   10/06/19: Regina Chapman - Vertigo, rx for meclizine PRN, ENT referral   09/07/19: Regina Chapman - Pansinusitis, rx for amoxicillin  12/07/18: Regina Chapman - increase bupropion to BID, add resistance training, Shingrix   Consult Visit:  01/13/20: mammogram.   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 Augmentin [Amoxicillin-Pot Clavulanate] Nausea And Vomiting    Able to tolerate Amoxil  . Cortisone Other (See Comments)    Pt had depomedrol inj and next day had red face,puffiness in face and slight swelling in face; no SOB or swelling in mouth,tongue or throat.   Medications: Outpatient Encounter Medications as of 02/06/2020  Medication Sig  . aspirin 81 MG tablet Take 81 mg by mouth 2 (two) times a week.    . Biotin 10000 MCG TABS Take 1 tablet by mouth daily.  Marland Kitchen buPROPion (WELLBUTRIN SR) 200 MG 12 hr tablet Take 1 tablet (200 mg total) by mouth 2 (two) times daily.  . cholecalciferol (VITAMIN D) 1000 units tablet Take 1,000 Units by mouth daily.  . hydrochlorothiazide (HYDRODIURIL) 25  MG tablet TAKE 1 TABLET(25 MG) BY MOUTH DAILY  . loratadine (CLARITIN) 10 MG tablet Take 1 tablet (10 mg total) by mouth daily.  Marland Kitchen losartan (COZAAR) 100 MG tablet TAKE 1 TABLET(100 MG) BY MOUTH DAILY  . meclizine (ANTIVERT) 25 MG tablet Take 1 tablet (25 mg total) by mouth 3 (three) times daily as needed for dizziness. (Patient not taking: Reported on 12/09/2019)  . Omega 3 1000 MG CAPS Take 2 capsules by mouth daily.  . potassium citrate (UROCIT-K) 10 MEQ (1080 MG) SR tablet Take 20 mEq by mouth 2 (two) times daily.  . traZODone (DESYREL) 50 MG tablet TAKE 1 TO 2 TABLETS(50 TO 100 MG) BY MOUTH AT BEDTIME AS NEEDED   No facility-administered encounter medications on file as of 02/06/2020.   Current Diagnosis/Assessment:  Goals    . Pharmacy Care Plan     CARE PLAN ENTRY  Current Barriers:  . Chronic Disease Management support, education, and care coordination needs related to Hypertension, Hyperlipidemia, and Depression   Hypertension BP Readings from Last 3 Encounters:  10/06/19 138/82  09/07/19 (!) 145/86  02/16/19 132/70 .  Pharmacist Clinical Goal(s): o Over the next 3 months, patient will work with PharmD and providers to maintain BP goal <140/90 mmHg . Current regimen:   Losartan 100 mg - 1 tablet daily (AM)  Hydrochlorothiazide 25 mg - 1 tablet daily (AM) . Interventions: o Reviewed home blood pressure monitoring log, BP is within goal . Patient self care activities - Over the next 3 months, patient will: o Continue to check  BP several days per week, document, and provide at future appointments o Ensure daily salt intake < 2300 mg/day  Hyperlipidemia Lab Results  Component Value Date/Time   LDLCALC 131 (H) 04/04/2015 09:36 AM   LDLDIRECT 183.9 02/09/2013 11:33 AM .  Pharmacist Clinical Goal(s): o Over the next 3 months, patient will work with PharmD and providers to achieve LDL goal < 100 . Current regimen:  o Omega 3 fatty acids (EPA/DHA) 1200 mg - 2 capsules once  daily   Interventions: o Discussed lipid panel drawn at [facility]. No results available at this time.  . Patient self care activities - Over the next 3 months, patient will: . Slowly increase exercise with goal of 30 minutes, 5 days per week as tolerated . Incorporate a healthy diet high in vegetables, fruits and whole grains with low-fat dairy products, chicken, fish, legumes, non-tropical vegetable oils and nuts. Limit intake of sweets, sugar-sweetened beverages and red meats.  Depression . Pharmacist Clinical Goal(s) o Over the next 3 months, patient will work with PharmD and providers to improve irritability . Current regimen:   Bupropion 200 mg 12 hr - 1 tablet twice daily  Trazodone 50 mg - 1-2 tablets at bedtime as needed for sleep . Interventions: o Reviewed current symptom control, patient reports good control with current therapy . Patient self care activities - Over the next 3 months, patient will: o Medications continued to be delivered through [facility]. o Continue trazodone as needed   Please see past updates related to this goal by clicking on the "Past Updates" button in the selected goal       Hypertension   CMP completed at [facility] 05/04/19 - all WNL except elevated glucose - 143 CMP Latest Ref Rng & Units 07/07/2017 06/22/2017 09/26/2016  Glucose 70 - 99 mg/dL 97 95 91  BUN 6 - 23 mg/dL 16 12 19   Creatinine 0.40 - 1.20 mg/dL 0.77 0.78 0.89  Sodium 135 - 145 mEq/L 138 139 140  Potassium 3.5 - 5.1 mEq/L 4.1 4.1 4.2  Chloride 96 - 112 mEq/L 99 100 103  CO2 19 - 32 mEq/L 32 31 30  Calcium 8.4 - 10.5 mg/dL 10.4 10.8(H) 10.2  Total Protein 6.0 - 8.3 g/dL - 7.9 7.8  Total Bilirubin 0.2 - 1.2 mg/dL - 0.5 0.5  Alkaline Phos 39 - 117 U/L - 58 53  AST 0 - 37 U/L - 19 21  ALT 0 - 35 U/L - 24 19   Office blood pressures are: BP Readings from Last 3 Encounters:  12/09/19 116/70  10/06/19 138/82  09/07/19 (!) 145/86   BP goal < 140/90 mmHg Patient has failed these meds in the  past: none Patient checks BP at home 3-5x per week Patient home BP readings are ranging: 135/70-75 mmHg, HR normal range    Patient is currently controlled on the following medications:   Losartan 100 mg - 1 tablet daily (AM)  Hydrochlorothiazide 25 mg - 1 tablet daily (AM)  We discussed: refills timely, meds are being synced through [facility]  Update 02/06/20: Patient reports good control with therapy.    Plan: Continue current medications  Hyperlipidemia   CBC Latest Ref Rng & Units 06/22/2017 09/26/2016 01/20/2014  WBC 4.0 - 10.5 K/uL 9.8 7.7 8.8  Hemoglobin 12.0 - 15.0 g/dL 14.8 15.3(H) 15.6(H)  Hematocrit 36 - 46 % 43.2 45.8 46.4(H)  Platelets 150 - 400 K/uL 290.0 259.0 233.0   Lipid Panel     Component Value Date/Time  CHOL 187 04/04/2015 0936   TRIG 106.0 04/04/2015 0936   HDL 34.90 (L) 04/04/2015 0936   CHOLHDL 5 04/04/2015 0936   VLDL 21.2 04/04/2015 0936   LDLCALC 131 (H) 04/04/2015 0936   LDLDIRECT 183.9 02/09/2013 1133    Last lipid panel: 2016 LDL goal < 100 Patient has failed these meds in past: none Patient is currently uncontrolled on the following medications:   Omega 3 fatty acids (EPA/DHA) 1200 mg - 2 capsules once daily    We discussed: prefers not to add additional medication, due for updated lipid panel   Update 02/06/20: Patient reports that her lipid panel was drawn at [facility]. She isn't sure what the result was. Patient denies any concerns noted from provider.. Results not available in chart. Patient reports continuing to take fish oil daily.   Plan: Continue current medications; Recommend continuing dietary and exercise goals.  Depression    Patient has failed these meds in past: none  Patient is currently controlled on the following medications:   Bupropion 200 mg 12 hr - 1 tablet BID   Trazodone 50 mg - 1-2 tablets qhs   We discussed: Sleep - has reduced frequency of trazodone due to sleeping well Mood - reports feeling frustrated  easily, feels like she needs to increase bupropion; last dose change in August 2020 (increased from 150 mg once daily to BID); feeling more depressed and anxious. Denies failed therapies in the past. Next PCP visit is August 2021.  Update 02/06/20: Patient reports good control with therapy. Denies need for change at this time. Scheduled to follow-up with [place] [facility]rologist today.   Plan: Continue current medications; Monitor symptoms and control at next CCM call in 3 months.   Kidney Stones   Patient has failed these meds in past: potassium citrate (cost - $50+) Patient is currently controlled on the following medications:   Potassium bicarbonate effervescent tablet 25 mEq - 1 tablet daily mixed with glass of water   We discussed: Sees nephrologist at [place] [facility]. After last visit, tried switching patient back to potassium citrate due to taste/difficulty swallowing potassium bicarbonate but cost was too high. Pt has tried different flavors of the potassium bicarb and still unable to tolerate. She continues potassium bicarbonate but only taking 2-3 days per week. She has discussed concerns with urology.  Plan: Continue current medications   Vaccines   Reviewed and discussed patient's vaccination history. Previously recommended patient receive Shingrix (reports cost - $175). Unable to afford vaccine. Reports received both doses of COVID-19 vaccine.   Immunization History  Administered Date(s) Administered  . Influenza, High Dose Seasonal PF 04/29/2016, 03/03/2017, 01/06/2019  . Influenza, Seasonal, Injecte, Preservative Fre 01/23/2015  . Influenza,inj,Quad PF,6+ Mos 02/09/2013, 01/20/2014  . Influenza-Unspecified 02/11/2012, 02/22/2018  . PFIZER SARS-COV-2 Vaccination 06/22/2019, 07/19/2019  . Pneumococcal Conjugate-13 07/20/2015  . Pneumococcal Polysaccharide-23 09/26/2016  . Tdap 02/09/2013   Plan: Remain up to date on vaccinations. Recommend  considering third COVID vaccine.   Medication Management  OTCs: loratadine 10 mg - PRN (hasn't needed in a long time), biotin 10000 mcg - 1 daily, vitamin D 1000 IU - 2 tablets   Pharmacy/Benefits: [facility]  Adherence: denies missed doses   Social support: lives alone, independent ADLs, son lives nearby  Affordability: denies concerns  CCM Follow Up:  3 months, telephone  Sherre Poot, PharmD, [facility] Clinical Pharmacist [place] [phone] (office) [phone] (mobile)

## 2020-02-06 ENCOUNTER — Ambulatory Visit: Payer: Medicare Other

## 2020-02-06 ENCOUNTER — Other Ambulatory Visit: Payer: Self-pay

## 2020-02-06 DIAGNOSIS — R82991 Hypocitraturia: Secondary | ICD-10-CM | POA: Diagnosis not present

## 2020-02-06 DIAGNOSIS — E782 Mixed hyperlipidemia: Secondary | ICD-10-CM

## 2020-02-06 DIAGNOSIS — N200 Calculus of kidney: Secondary | ICD-10-CM | POA: Diagnosis not present

## 2020-02-06 DIAGNOSIS — I10 Essential (primary) hypertension: Secondary | ICD-10-CM

## 2020-02-06 NOTE — Patient Instructions (Signed)
Visit Information  Goals Addressed            This Visit's Progress   . Pharmacy Care Plan       CARE PLAN ENTRY  Current Barriers:  . Chronic Disease Management support, education, and care coordination needs related to Hypertension, Hyperlipidemia, and Depression   Hypertension BP Readings from Last 3 Encounters:  10/06/19 138/82  09/07/19 (!) 145/86  02/16/19 132/70 .  Pharmacist Clinical Goal(s): o Over the next 3 months, patient will work with PharmD and providers to maintain BP goal <140/90 mmHg . Current regimen:   Losartan 100 mg - 1 tablet daily (AM)  Hydrochlorothiazide 25 mg - 1 tablet daily (AM) . Interventions: o Reviewed home blood pressure monitoring log, BP is within goal . Patient self care activities - Over the next 3 months, patient will: o Continue to check BP several days per week, document, and provide at future appointments o Ensure daily salt intake < 2300 mg/day  Hyperlipidemia Lab Results  Component Value Date/Time   LDLCALC 131 (H) 04/04/2015 09:36 AM   LDLDIRECT 183.9 02/09/2013 11:33 AM .  Pharmacist Clinical Goal(s): o Over the next 3 months, patient will work with PharmD and providers to achieve LDL goal < 100 . Current regimen:  o Omega 3 fatty acids (EPA/DHA) 1200 mg - 2 capsules once daily   Interventions: o Discussed lipid panel drawn at [facility]. No results available at this time.  . Patient self care activities - Over the next 3 months, patient will: . Slowly increase exercise with goal of 30 minutes, 5 days per week as tolerated . Incorporate a healthy diet high in vegetables, fruits and whole grains with low-fat dairy products, chicken, fish, legumes, non-tropical vegetable oils and nuts. Limit intake of sweets, sugar-sweetened beverages and red meats.  Depression . Pharmacist Clinical Goal(s) o Over the next 3 months, patient will work with PharmD and providers to improve irritability . Current regimen:   Bupropion 200 mg 12 hr -  1 tablet twice daily  Trazodone 50 mg - 1-2 tablets at bedtime as needed for sleep . Interventions: o Reviewed current symptom control, patient reports good control with current therapy . Patient self care activities - Over the next 3 months, patient will: o Medications continued to be delivered through [facility]. o Continue trazodone as needed   Please see past updates related to this goal by clicking on the "Past Updates" button in the selected goal        The patient verbalized understanding of instructions provided today and declined a print copy of patient instruction materials.   Telephone follow up appointment with pharmacy team member scheduled for: 04/2020  Sherre Poot, PharmD, [facility] Clinical Pharmacist [facility] [phone] (office) [phone] (mobile)   DASH Eating Plan DASH stands for "Dietary Approaches to Stop Hypertension." The DASH eating plan is a healthy eating plan that has been shown to reduce high blood pressure (hypertension). It may also reduce your risk for type 2 diabetes, heart disease, and stroke. The DASH eating plan may also help with weight loss. What are tips for following this plan?  General guidelines  Avoid eating more than 2,300 mg (milligrams) of salt (sodium) a day. If you have hypertension, you may need to reduce your sodium intake to 1,500 mg a day.  Limit alcohol intake to no more than 1 drink a day for nonpregnant women and 2 drinks a day for men. One drink equals 12 oz of beer, 5  oz of wine, or 1 oz of hard liquor.  Work with your health care provider to maintain a healthy body weight or to lose weight. Ask what an ideal weight is for you.  Get at least 30 minutes of exercise that causes your heart to beat faster (aerobic exercise) most days of the week. Activities may include walking, swimming, or biking.  Work with your health care provider or diet and nutrition specialist (dietitian) to adjust your eating plan to  your individual calorie needs. Reading food labels   Check food labels for the amount of sodium per serving. Choose foods with less than 5 percent of the Daily Value of sodium. Generally, foods with less than 300 mg of sodium per serving fit into this eating plan.  To find whole grains, look for the word "whole" as the first word in the ingredient list. Shopping  Buy products labeled as "low-sodium" or "no salt added."  Buy fresh foods. Avoid canned foods and premade or frozen meals. Cooking  Avoid adding salt when cooking. Use salt-free seasonings or herbs instead of table salt or sea salt. Check with your health care provider or pharmacist before using salt substitutes.  Do not fry foods. Cook foods using healthy methods such as baking, boiling, grilling, and broiling instead.  Cook with heart-healthy oils, such as olive, canola, soybean, or sunflower oil. Meal planning  Eat a balanced diet that includes: ? 5 or more servings of fruits and vegetables each day. At each meal, try to fill half of your plate with fruits and vegetables. ? Up to 6-8 servings of whole grains each day. ? Less than 6 oz of lean meat, poultry, or fish each day. A 3-oz serving of meat is about the same size as a deck of cards. One egg equals 1 oz. ? 2 servings of low-fat dairy each day. ? A serving of nuts, seeds, or beans 5 times each week. ? Heart-healthy fats. Healthy fats called Omega-3 fatty acids are found in foods such as flaxseeds and coldwater fish, like sardines, salmon, and mackerel.  Limit how much you eat of the following: ? Canned or prepackaged foods. ? Food that is high in trans fat, such as fried foods. ? Food that is high in saturated fat, such as fatty meat. ? Sweets, desserts, sugary drinks, and other foods with added sugar. ? Full-fat dairy products.  Do not salt foods before eating.  Try to eat at least 2 vegetarian meals each week.  Eat more home-cooked food and less restaurant,  buffet, and fast food.  When eating at a restaurant, ask that your food be prepared with less salt or no salt, if possible. What foods are recommended? The items listed may not be a complete list. Talk with your dietitian about what dietary choices are best for you. Grains Whole-grain or whole-wheat bread. Whole-grain or whole-wheat pasta. Ebelin Dillehay rice. Modena Morrow. Bulgur. Whole-grain and low-sodium cereals. Pita bread. Low-fat, low-sodium crackers. Whole-wheat flour tortillas. Vegetables Fresh or frozen vegetables (raw, steamed, roasted, or grilled). Low-sodium or reduced-sodium tomato and vegetable juice. Low-sodium or reduced-sodium tomato sauce and tomato paste. Low-sodium or reduced-sodium canned vegetables. Fruits All fresh, dried, or frozen fruit. Canned fruit in natural juice (without added sugar). Meat and other protein foods Skinless chicken or [place]. Ground chicken or [place]. Pork with fat trimmed off. Fish and seafood. Egg whites. Dried beans, peas, or lentils. Unsalted nuts, nut butters, and seeds. Unsalted canned beans. Lean cuts of beef with fat trimmed off.  Low-sodium, lean deli meat. Dairy Low-fat (1%) or fat-free (skim) milk. Fat-free, low-fat, or reduced-fat cheeses. Nonfat, low-sodium ricotta or cottage cheese. Low-fat or nonfat yogurt. Low-fat, low-sodium cheese. Fats and oils Soft margarine without trans fats. Vegetable oil. Low-fat, reduced-fat, or light mayonnaise and salad dressings (reduced-sodium). Canola, safflower, olive, soybean, and sunflower oils. Avocado. Seasoning and other foods Herbs. Spices. Seasoning mixes without salt. Unsalted popcorn and pretzels. Fat-free sweets. What foods are not recommended? The items listed may not be a complete list. Talk with your dietitian about what dietary choices are best for you. Grains Baked goods made with fat, such as croissants, muffins, or some breads. Dry pasta or rice meal packs. Vegetables Creamed or fried  vegetables. Vegetables in a cheese sauce. Regular canned vegetables (not low-sodium or reduced-sodium). Regular canned tomato sauce and paste (not low-sodium or reduced-sodium). Regular tomato and vegetable juice (not low-sodium or reduced-sodium). Angie Fava. Olives. Fruits Canned fruit in a light or heavy syrup. Fried fruit. Fruit in cream or butter sauce. Meat and other protein foods Fatty cuts of meat. Ribs. Fried meat. Berniece Salines. Sausage. Bologna and other processed lunch meats. Salami. Fatback. Hotdogs. Bratwurst. Salted nuts and seeds. Canned beans with added salt. Canned or smoked fish. Whole eggs or egg yolks. Chicken or [place] with skin. Dairy Whole or 2% milk, cream, and half-and-half. Whole or full-fat cream cheese. Whole-fat or sweetened yogurt. Full-fat cheese. Nondairy creamers. Whipped toppings. Processed cheese and cheese spreads. Fats and oils Butter. Stick margarine. Lard. Shortening. Ghee. Bacon fat. Tropical oils, such as coconut, palm kernel, or palm oil. Seasoning and other foods Salted popcorn and pretzels. Onion salt, garlic salt, seasoned salt, table salt, and sea salt. Worcestershire sauce. Tartar sauce. Barbecue sauce. Teriyaki sauce. Soy sauce, including reduced-sodium. Steak sauce. Canned and packaged gravies. Fish sauce. Oyster sauce. Cocktail sauce. Horseradish that you find on the shelf. Ketchup. Mustard. Meat flavorings and tenderizers. Bouillon cubes. Hot sauce and Tabasco sauce. Premade or packaged marinades. Premade or packaged taco seasonings. Relishes. Regular salad dressings. Where to find more information:  National Heart, Lung, and [place]: https://wilson-eaton.com/  American Heart Association: www.heart.org Summary  The DASH eating plan is a healthy eating plan that has been shown to reduce high blood pressure (hypertension). It may also reduce your risk for type 2 diabetes, heart disease, and stroke.  With the DASH eating plan, you should limit salt (sodium)  intake to 2,300 mg a day. If you have hypertension, you may need to reduce your sodium intake to 1,500 mg a day.  When on the DASH eating plan, aim to eat more fresh fruits and vegetables, whole grains, lean proteins, low-fat dairy, and heart-healthy fats.  Work with your health care provider or diet and nutrition specialist (dietitian) to adjust your eating plan to your individual calorie needs. This information is not intended to replace advice given to you by your health care provider. Make sure you discuss any questions you have with your health care provider. Document Revised: 03/27/2017 Document Reviewed: 04/07/2016 Elsevier Patient Education  2020 [employer].

## 2020-02-07 DIAGNOSIS — Z23 Encounter for immunization: Secondary | ICD-10-CM | POA: Diagnosis not present

## 2020-02-29 ENCOUNTER — Telehealth: Payer: Self-pay

## 2020-02-29 NOTE — Progress Notes (Addendum)
Chronic Care Management Pharmacy Assistant   Name: Regina Chapman  MRN: [id number] DOB: Apr 21, 1951  Reason for Encounter: Medication Review  Patient Questions:  1.  Have you seen any other providers since your last visit? Yes, [facility]  2.  Any changes in your medicines or health? No   Dory Horn,  69 y.o. , female presents for their Follow-Up CCM visit with the clinical pharmacist via telephone.  PCP : [name], MD  Allergies:   Allergies  Allergen Reactions   Augmentin [Amoxicillin-Pot Clavulanate] Nausea And Vomiting    Able to tolerate Amoxil   Cortisone Other (See Comments)    Pt had depomedrol inj and next day had red face,puffiness in face and slight swelling in face; no SOB or swelling in mouth,tongue or throat.    Medications: Outpatient Encounter Medications as of 02/29/2020  Medication Sig   aspirin 81 MG tablet Take 81 mg by mouth 2 (two) times a week.     Biotin 10000 MCG TABS Take 1 tablet by mouth daily.   buPROPion (WELLBUTRIN SR) 200 MG 12 hr tablet Take 1 tablet (200 mg total) by mouth 2 (two) times daily.   cholecalciferol (VITAMIN D) 1000 units tablet Take 1,000 Units by mouth daily.   hydrochlorothiazide (HYDRODIURIL) 25 MG tablet TAKE 1 TABLET(25 MG) BY MOUTH DAILY   loratadine (CLARITIN) 10 MG tablet Take 1 tablet (10 mg total) by mouth daily.   losartan (COZAAR) 100 MG tablet TAKE 1 TABLET(100 MG) BY MOUTH DAILY   meclizine (ANTIVERT) 25 MG tablet Take 1 tablet (25 mg total) by mouth 3 (three) times daily as needed for dizziness. (Patient not taking: Reported on 12/09/2019)   Omega 3 1000 MG CAPS Take 2 capsules by mouth daily.   potassium citrate (UROCIT-K) 10 MEQ (1080 MG) SR tablet Take 20 mEq by mouth 2 (two) times daily.   traZODone (DESYREL) 50 MG tablet TAKE 1 TO 2 TABLETS(50 TO 100 MG) BY MOUTH AT BEDTIME AS NEEDED   No facility-administered encounter medications on file as of 02/29/2020.    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  Recent visits: 02/06/2020 [facility] - Continue Potassium Citrate (OTC)-take 2 tablets in the morning and 1 tablet at night  BP Readings from Last 3 Encounters:  12/09/19 116/70  10/06/19 138/82  09/07/19 (!) 145/86    Home BP monitoring: 140/80, 145/86 No results found for: HGBA1C   Patient obtains medications through Vials  90 Days   Last adherence delivery on 12/05/19 included Losartan 100 mg - 1 daily   Hydrochlorothiazide 25 mg - 1 daily   Bupropion 200 mg 12 hr -1 twice daily  Trazodone 50 mg - 1-2 tablets at bedtime PRN   Vitamin D3 2000 IU - 1 daily  Biotin 5000 mcg - 1 daily   Patient is due for next adherence delivery on:  03/02/2020 Called patient and reviewed medications and coordinated delivery.  This delivery to include: Hydrochlorothiazide 25 mg - 1 daily  (breakfast) Bupropion 200 mg 12 hr -1 twice daily (breakfast, bedtime) Trazodone 50 mg - 1-2 tablets at bedtime PRN   Vitamin D3 2000 IU - 1 daily (breakfast) Biotin 5000 mcg - 1 daily (breakfast)  Patient declined the following medications: Potassium Citrate 10 meq- 2 tablets at breakfast and 1 tablet at bedtime due to unable to tolerate and cost Omega 3 fish oil 360 mg- 2 tablets daily (breakfast) due to filling OTC needs at [facility] Losartan 100mg -1 tab daily (breakfast) due to abundance on  hand  Patient does not need any refills prior to delivery.   Reviewed by Debbora Dus: Noted patients BP is elevated above 140/90 and past due for refill on losartan. Contacted patient on 11/4 to discuss adherence and left VM for return call.   Confirmed delivery date of 03/02/2020 advised patient that pharmacy will contact them the morning of delivery.  Doristine Counter, [place] [facility]  Debbora Dus, PharmD Clinical Pharmacist [facility] at [facility] [phone]

## 2020-03-05 DIAGNOSIS — M25559 Pain in unspecified hip: Secondary | ICD-10-CM | POA: Diagnosis not present

## 2020-03-05 DIAGNOSIS — C801 Malignant (primary) neoplasm, unspecified: Secondary | ICD-10-CM | POA: Diagnosis not present

## 2020-03-05 DIAGNOSIS — M4800 Spinal stenosis, site unspecified: Secondary | ICD-10-CM | POA: Diagnosis not present

## 2020-03-05 DIAGNOSIS — Z23 Encounter for immunization: Secondary | ICD-10-CM | POA: Diagnosis not present

## 2020-03-05 DIAGNOSIS — J984 Other disorders of lung: Secondary | ICD-10-CM | POA: Diagnosis not present

## 2020-03-05 DIAGNOSIS — G939 Disorder of brain, unspecified: Secondary | ICD-10-CM | POA: Diagnosis not present

## 2020-03-05 DIAGNOSIS — Z79899 Other long term (current) drug therapy: Secondary | ICD-10-CM | POA: Diagnosis not present

## 2020-03-05 DIAGNOSIS — C7A8 Other malignant neuroendocrine tumors: Secondary | ICD-10-CM | POA: Diagnosis not present

## 2020-03-05 DIAGNOSIS — R42 Dizziness and giddiness: Secondary | ICD-10-CM | POA: Diagnosis not present

## 2020-03-05 DIAGNOSIS — Z85820 Personal history of malignant melanoma of skin: Secondary | ICD-10-CM | POA: Diagnosis not present

## 2020-03-05 DIAGNOSIS — C7B8 Other secondary neuroendocrine tumors: Secondary | ICD-10-CM | POA: Diagnosis not present

## 2020-03-05 DIAGNOSIS — C7951 Secondary malignant neoplasm of bone: Secondary | ICD-10-CM | POA: Diagnosis not present

## 2020-03-05 DIAGNOSIS — C7802 Secondary malignant neoplasm of left lung: Secondary | ICD-10-CM | POA: Diagnosis not present

## 2020-03-05 DIAGNOSIS — R110 Nausea: Secondary | ICD-10-CM | POA: Diagnosis not present

## 2020-03-05 DIAGNOSIS — C7801 Secondary malignant neoplasm of right lung: Secondary | ICD-10-CM | POA: Diagnosis not present

## 2020-03-05 DIAGNOSIS — N201 Calculus of ureter: Secondary | ICD-10-CM | POA: Diagnosis not present

## 2020-03-26 ENCOUNTER — Telehealth: Payer: Self-pay

## 2020-03-26 NOTE — Progress Notes (Signed)
Chronic Care Management Pharmacy Assistant   Name: Regina Chapman  MRN: [id number] DOB: [birth year]  Reason for Encounter: Medication Review  Patient Questions:  1.  Have you seen any other providers since your last visit? No  2.  Any changes in your medicines or health? No   Regina Chapman,  69 y.o. , female presents for their Follow-Up CCM visit with the clinical pharmacist via telephone.  PCP : [name], MD  Allergies:   Allergies  Allergen Reactions  . Augmentin [Amoxicillin-Pot Clavulanate] Nausea And Vomiting    Able to tolerate Amoxil  . Cortisone Other (See Comments)    Pt had depomedrol inj and next day had red face,puffiness in face and slight swelling in face; no SOB or swelling in mouth,tongue or throat.    Medications: Outpatient Encounter Medications as of 03/26/2020  Medication Sig  . aspirin 81 MG tablet Take 81 mg by mouth 2 (two) times a week.    . Biotin 10000 MCG TABS Take 1 tablet by mouth daily.  Marland Kitchen buPROPion (WELLBUTRIN SR) 200 MG 12 hr tablet Take 1 tablet (200 mg total) by mouth 2 (two) times daily.  . cholecalciferol (VITAMIN D) 1000 units tablet Take 1,000 Units by mouth daily.  . hydrochlorothiazide (HYDRODIURIL) 25 MG tablet TAKE 1 TABLET(25 MG) BY MOUTH DAILY  . loratadine (CLARITIN) 10 MG tablet Take 1 tablet (10 mg total) by mouth daily.  Marland Kitchen losartan (COZAAR) 100 MG tablet TAKE 1 TABLET(100 MG) BY MOUTH DAILY  . meclizine (ANTIVERT) 25 MG tablet Take 1 tablet (25 mg total) by mouth 3 (three) times daily as needed for dizziness. (Patient not taking: Reported on 12/09/2019)  . Omega 3 1000 MG CAPS Take 2 capsules by mouth daily.  . potassium citrate (UROCIT-K) 10 MEQ (1080 MG) SR tablet Take 20 mEq by mouth 2 (two) times daily.  . traZODone (DESYREL) 50 MG tablet TAKE 1 TO 2 TABLETS(50 TO 100 MG) BY MOUTH AT BEDTIME AS NEEDED   No facility-administered encounter medications on file as of 03/26/2020.    Current Diagnosis: Patient  Active Problem List   Diagnosis Date Noted  . Vertigo 10/06/2019  . Kidney stones 12/07/2018  . Acute non-recurrent pansinusitis 10/08/2017  . Neuropathy 09/26/2016  . Advance directive discussed with patient 07/20/2015  . Metastatic carcinoid tumor ([place]) 04/04/2015  . Obesity   . Routine general medical examination at a health care facility 02/09/2013  . Hyperlipidemia   . Hypertension   . Recurrent major depression in partial remission ([place]) 09/16/2008    Goals Addressed   None     Follow-Up:  Coordination of Enhanced Pharmacy Services   .Marland Kitchen Reviewed chart for medication changes ahead of medication coordination call.  No OVs, Consults, or hospital visits since last care coordination call/Pharmacist visit. No medication changes indicated.  BP Readings from Last 3 Encounters:  12/09/19 116/70  10/06/19 138/82  09/07/19 (!) 145/86    No results found for: HGBA1C   Patient obtains medications through Vials  90 Days    Last adherence delivery included: Hydrochlorothiazide 25 mg - 1 daily  (breakfast)  Bupropion 200 mg 12 hr -1 twice daily (breakfast, bedtime)  Trazodone 50 mg - 1-2 tablets at bedtime PRN    Vitamin D3 2000 IU - 1 daily (breakfast)  Biotin 5000 mcg - 1 daily (breakfast  Patient declined (meds) last month due to PRN use/additional supply on hand. Potassium Citrate 10 meq- 2 tablets at breakfast and 1  tablet at bedtime due to unable to tolerate and cost Omega 3 fish oil 360 mg- 2 tablets daily (breakfast) due to filling OTC needs at [facility] Losartan 100mg -1 tab daily (breakfast) due to abundance on hand)  Patient is due for next adherence delivery on: 03/31/2020. Called patient and reviewed medications and coordinated delivery.  This delivery to include  :Losartan 100 mg tablet- Take 1 tablet by mouth daily     Patient declined the following medications  Bupropion 200mg  12 HR Tablet- Take one tablet by mouth two time daily (on hand supply due  to delivery 03/01/2020)  Vitamin D3 50 mcg- Take one capsule by mouth daily (on hand supply due to delivery 03/01/2020)  Hydrochlorothiazide 25 mg tablet-Take one tablet by mouth daily (on hand supply due to delivery 03/01/2020)  Meclizine 25 mg table- Take 1 tablet by mouth three times daily (on hand supply due to delivery 03/01/2020)  Omega 3 1000 mg Capsule- Take 2 capsules by mouth daily (on hand supply due to delivery 03/01/2020)  Potassium Citrate 10 meq Sr Tablet- take two tablets by mouth two times daily (on hand supply due to delivery 03/01/2020)  Trazodone 50 mg tablet- Take 1 to 2 tablet by mouth at bedtime as needed ((on hand supply due to delivery 03/01/2020)  Aspirin 81mg  tablet- Take one tablet by mouth two times a week (OTC)  Loratadine 10 mg Tablet- Take 1 tablet by mouth daily (OTC)   Confirmed delivery date of 03/30/2020, advised patient that pharmacy will contact them the morning of delivery.

## 2020-04-17 ENCOUNTER — Telehealth: Payer: Self-pay

## 2020-04-17 NOTE — Chronic Care Management (AMB) (Signed)
Chronic Care Management Pharmacy Assistant   Name: Regina Chapman  MRN: [id number] DOB: [birth year]  Reason for Encounter: Medication Review  Patient Questions:  1.  Have you seen any other providers since your last visit? No  2.  Any changes in your medicines or health? No    PCP : [name], MD  Allergies:   Allergies  Allergen Reactions  . Augmentin [Amoxicillin-Pot Clavulanate] Nausea And Vomiting    Able to tolerate Amoxil  . Cortisone Other (See Comments)    Pt had depomedrol inj and next day had red face,puffiness in face and slight swelling in face; no SOB or swelling in mouth,tongue or throat.    Medications: Outpatient Encounter Medications as of 04/17/2020  Medication Sig  . aspirin 81 MG tablet Take 81 mg by mouth 2 (two) times a week.    . Biotin 10000 MCG TABS Take 1 tablet by mouth daily.  Marland Kitchen buPROPion (WELLBUTRIN SR) 200 MG 12 hr tablet Take 1 tablet (200 mg total) by mouth 2 (two) times daily.  . cholecalciferol (VITAMIN D) 1000 units tablet Take 1,000 Units by mouth daily.  . hydrochlorothiazide (HYDRODIURIL) 25 MG tablet TAKE 1 TABLET(25 MG) BY MOUTH DAILY  . loratadine (CLARITIN) 10 MG tablet Take 1 tablet (10 mg total) by mouth daily.  Marland Kitchen losartan (COZAAR) 100 MG tablet TAKE 1 TABLET(100 MG) BY MOUTH DAILY  . meclizine (ANTIVERT) 25 MG tablet Take 1 tablet (25 mg total) by mouth 3 (three) times daily as needed for dizziness. (Patient not taking: Reported on 12/09/2019)  . Omega 3 1000 MG CAPS Take 2 capsules by mouth daily.  . potassium citrate (UROCIT-K) 10 MEQ (1080 MG) SR tablet Take 20 mEq by mouth 2 (two) times daily.  . traZODone (DESYREL) 50 MG tablet TAKE 1 TO 2 TABLETS(50 TO 100 MG) BY MOUTH AT BEDTIME AS NEEDED   No facility-administered encounter medications on file as of 04/17/2020.    Current Diagnosis: Patient Active Problem List   Diagnosis Date Noted  . Vertigo 10/06/2019  . Kidney stones 12/07/2018  . Acute non-recurrent  pansinusitis 10/08/2017  . Neuropathy 09/26/2016  . Advance directive discussed with patient 07/20/2015  . Metastatic carcinoid tumor ([place]) 04/04/2015  . Obesity   . Routine general medical examination at a health care facility 02/09/2013  . Hyperlipidemia   . Hypertension   . 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  No OVs, Consults, or hospital visits since last care coordination call/Pharmacist visit.   No medication changes indicated.  BP Readings from Last 3 Encounters:  12/09/19 116/70  10/06/19 138/82  09/07/19 (!) 145/86    No results found for: HGBA1C   04/17/20 [facility]   Patient obtains medications through Vials  90 Days   Last adherence delivery included:  Losartan 100 mg tablet- Take 1 tablet by mouth daily   Patient declined the following medications last month: Aspirin 81mg  tablet- Take one tablet by mouth two times a week (OTC) Bupropion 200mg  12 HR Tablet- Take one tablet by mouth two time daily (on hand supply due to delivery 03/01/2020) Hydrochlorothiazide 25 mg tablet-Take one tablet by mouth daily (on hand supply due to delivery 03/01/2020) Omega 3 1000 mg Capsule- Take 2 capsules by mouth daily (on hand supply due to delivery 03/01/2020) Potassium Citrate 10 meq Sr Tablet- take two tablets by mouth two times daily (on hand supply due to  delivery 03/01/2020) Trazodone 50 mg tablet- Take 1 to 2 tablet by mouth at bedtime as needed ((on hand supply due to delivery 03/01/2020) Vitamin D3 50 mcg- Take one capsule by mouth daily (on hand supply due to delivery 03/01/2020)     Patient is due for next adherence delivery on: 04/28/2020.  Called patient and reviewed medications and coordinated delivery.  This delivery to include: NONE  Patient will not need a short or acute  fill of any medications, prior to adherence delivery.   Patient declined the  following medications: Biotin 5 mg Take 1 capsule daily (breakfast) due to 90 ds on 03/01/20 Bupropion 200mg  12 HR Tablet- Take one tablet by mouth two time daily (on hand supply due to delivery 03/01/2020) Hydrochlorothiazide 25 mg tablet-Take one tablet by mouth daily (on hand supply due to delivery 03/01/2020) Losartan 100 mg tablet- Take 1 tablet by mouth daily - die tp 90 DS om 03/30/20 Omega 3 1000 mg Capsule- Take 2 capsules by mouth daily (on hand supply due to delivery 03/01/2020) Potassium Citrate 10 meq Sr Tablet- take two tablets by mouth two times daily (on hand supply due to delivery 03/01/2020) Trazodone 50 mg tablet- Take 1 to 2 tablet by mouth at bedtime as needed ((on hand supply due to delivery 03/01/2020) Vitamin D3 50 mcg- Take one capsule by mouth daily (on hand supply due to delivery 03/01/2020)  Patient does not need refills for any medications  No medications needed for  delivery date of 04/26/20.  Patient states she is doing well. Due to it being a busy week for her, she has not checked her blood pressure this week. Notes she usually checks a few times a week. She last checked about 1 week ago and it was 137/78. She states she is not having any major side effects with losartan. "She may forget a dose 1-2 times a week." She is not using a pill box at this time to organize medications.    Follow-Up:  Comptroller and Pharmacist Review   Debbora Dus, CPP notified  Margaretmary Dys, [place] [facility] Assistant [phone]

## 2020-05-11 ENCOUNTER — Other Ambulatory Visit: Payer: Self-pay

## 2020-05-11 ENCOUNTER — Telehealth: Payer: Self-pay

## 2020-05-11 ENCOUNTER — Ambulatory Visit: Payer: Medicare Other

## 2020-05-11 DIAGNOSIS — I10 Essential (primary) hypertension: Secondary | ICD-10-CM

## 2020-05-11 DIAGNOSIS — E785 Hyperlipidemia, unspecified: Secondary | ICD-10-CM

## 2020-05-11 MED ORDER — TRAZODONE HCL 50 MG PO TABS: ORAL_TABLET | ORAL | 3 refills | Status: DC

## 2020-05-11 NOTE — Telephone Encounter (Signed)
-----   Message from Debbora Dus, [facility] sent at 05/11/2020 11:29 AM EST ----- Regarding: Refill Request Regina Chapman is requesting a refill on trazodone to [facility].  Thank you,  Debbora Dus, PharmD Clinical Pharmacist [facility] at [facility] [phone]

## 2020-05-11 NOTE — Telephone Encounter (Signed)
Rx sent electronically.  

## 2020-05-11 NOTE — Chronic Care Management (AMB) (Signed)
Chronic Care Management Pharmacy  Name: Regina Chapman  MRN: [id number] DOB: Apr 24, 1951  Chief Complaint/ HPI  Regina Chapman,  70 y.o., female presents for their Follow-Up CCM visit with the clinical pharmacist via telephone.  PCP : [name], MD  Their chronic conditions include: hypertension, hyperlipidemia, depression  Last CCM 02/06/20  Office Visits:   12/09/19: [name] - Routine general medical exam. No medication changes noted  10/06/18: [facility] - Vertigo, rx for meclizine PRN, ENT referral   09/07/19: [name] - Pansinusitis, rx for amoxicillin  12/07/18: [facility] - increase bupropion to BID, add resistance training, Shingrix   Consult Visit:  02/06/20: urology - kidney stones, continue OTC potassium citrate 99 mg, 2 tab in am, 1 in pm.  05/19/19: chronic pain - no note available  05/17/19: benign tumor - no note available  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 Augmentin [Amoxicillin-Pot Clavulanate] Nausea And Vomiting    Able to tolerate Amoxil  . Cortisone Other (See Comments)    Pt had depomedrol inj and next day had red face,puffiness in face and slight swelling in face; no SOB or swelling in mouth,tongue or throat.   Medications: Outpatient Encounter Medications as of 05/11/2020  Medication Sig  . aspirin 81 MG tablet Take 81 mg by mouth 2 (two) times a week.    . Biotin 10000 MCG TABS Take 1 tablet by mouth daily.  Marland Kitchen buPROPion (WELLBUTRIN SR) 200 MG 12 hr tablet Take 1 tablet (200 mg total) by mouth 2 (two) times daily.  . cholecalciferol (VITAMIN D) 1000 units tablet Take 1,000 Units by mouth daily.  . hydrochlorothiazide (HYDRODIURIL) 25 MG tablet TAKE 1 TABLET(25 MG) BY MOUTH DAILY  . losartan (COZAAR) 100 MG tablet TAKE 1 TABLET(100 MG) BY MOUTH DAILY  . Omega 3 1000 MG CAPS Take 2 capsules by mouth daily.  . potassium citrate (UROCIT-K) 10 MEQ (1080 MG) SR  tablet Take 20 mEq by mouth 2 (two) times daily.  . traZODone (DESYREL) 50 MG tablet TAKE 1 TO 2 TABLETS(50 TO 100 MG) BY MOUTH AT BEDTIME AS NEEDED  . meclizine (ANTIVERT) 25 MG tablet Take 1 tablet (25 mg total) by mouth 3 (three) times daily as needed for dizziness. (Patient not taking: Reported on 05/22/2020)  . [DISCONTINUED] loratadine (CLARITIN) 10 MG tablet Take 1 tablet (10 mg total) by mouth daily.  . [DISCONTINUED] traZODone (DESYREL) 50 MG tablet TAKE 1 TO 2 TABLETS(50 TO 100 MG) BY MOUTH AT BEDTIME AS NEEDED   No facility-administered encounter medications on file as of 05/11/2020.   Current Diagnosis/Assessment:  Goals    . Pharmacy Care Plan     CARE PLAN ENTRY  Current Barriers:  . Chronic Disease Management support, education, and care coordination needs related to Hypertension, Hyperlipidemia, and Depression   Hypertension BP Readings from Last 3 Encounters:  10/06/19 138/82  09/07/19 (!) 145/86  02/16/19 132/70 .  Pharmacist Clinical Goal(s): o Over the next 3 months, patient will work with PharmD and providers to maintain BP goal <140/90 mmHg . Current regimen:   Losartan 100 mg - 1 tablet daily (AM)  Hydrochlorothiazide 25 mg - 1 tablet daily (AM) . Interventions: o Reviewed home blood pressure monitoring log, BP is within goal . Patient self care activities - Over the next 3 months, patient will: o Continue to check BP several days per week, document, and  provide at future appointments o Ensure daily salt intake < 2300 mg/day  Hyperlipidemia Lab Results  Component Value Date/Time   LDLCALC 131 (H) 04/04/2015 09:36 AM   LDLDIRECT 183.9 02/09/2013 11:33 AM .  Pharmacist Clinical Goal(s): o Over the next 3 months, patient will work with PharmD and providers to achieve LDL goal < 100 . Current regimen:  o Omega 3 fatty acids (EPA/DHA) 1200 mg - 2 capsules once daily   Interventions: o Recommend repeat lipid panel at annual visit  o Consider starting a  cholesterol medication to lower cardiovascular risk  . Patient self care activities - Over the next 3 months, patient will: . Slowly increase exercise with goal of 30 minutes, 5 days per week as tolerated . Incorporate a healthy diet high in vegetables, fruits and whole grains with low-fat dairy products, chicken, fish, legumes, non-tropical vegetable oils and nuts. Limit intake of sweets, sugar-sweetened beverages and red meats.  Depression . Pharmacist Clinical Goal(s) o Over the next 3 months, patient will work with PharmD and providers to improve irritability . Current regimen:   Bupropion 200 mg 12 hr - 1 tablet twice daily  Trazodone 50 mg - 1-2 tablets at bedtime as needed for sleep . Interventions: o Reviewed current symptom control, patient reports good control with current therapy . Patient self care activities - Over the next 3 months, patient will: o Medications continued to be delivered through [facility]. o Continue trazodone as needed   Please see past updates related to this goal by clicking on the "Past Updates" button in the selected goal        Hyperlipidemia   LDL goal < 100  Last lipids Lab Results  Component Value Date   CHOL 187 04/04/2015   HDL 34.90 (L) 04/04/2015   LDLCALC 131 (H) 04/04/2015   LDLDIRECT 183.9 02/09/2013   TRIG 106.0 04/04/2015   CHOLHDL 5 04/04/2015   Hepatic Function Latest Ref Rng & Units 07/07/2017 06/22/2017 09/26/2016  Total Protein 6.0 - 8.3 g/dL - 7.9 7.8  Albumin 3.5 - 5.2 g/dL 4.2 4.3 4.4  AST 0 - 37 U/L - 19 21  ALT 0 - 35 U/L - 24 19  Alk Phosphatase 39 - 117 U/L - 58 53  Total Bilirubin 0.2 - 1.2 mg/dL - 0.5 0.5  Bilirubin, Direct 0.0 - 0.3 mg/dL - - -    Unclear if lipid panel completed at [facility]  Patient has failed these meds in past: none  Patient is currently controlled on the following medications:   Omega 3 fish oil (EPA/DHA)1200 mg- 2 capsule once daily   Update 05/11/20: Discussed need to update lipid panel.  Patient prefers to wait until next wellness visit to collect labs. Based on prior labs recommend starting a statin. Patient denies any family history of premature ASCVD. She declines statin today.  Plan: Continue diet and exercise goals. Recommend repeat lipid panel in August 2022.  Hypertension   CMP completed at [facility] 05/04/19 - all WNL except elevated glucose - 143 CMP Latest Ref Rng & Units 07/07/2017 06/22/2017 09/26/2016  Glucose 70 - 99 mg/dL 97 95 91  BUN 6 - 23 mg/dL 16 12 19   Creatinine 0.40 - 1.20 mg/dL 0.77 0.78 0.89  Sodium 135 - 145 mEq/L 138 139 140  Potassium 3.5 - 5.1 mEq/L 4.1 4.1 4.2  Chloride 96 - 112 mEq/L 99 100 103  CO2 19 - 32 mEq/L 32 31 30  Calcium 8.4 - 10.5 mg/dL 10.4 10.8(H) 10.2  Total Protein 6.0 - 8.3 g/dL - 7.9 7.8  Total Bilirubin 0.2 - 1.2 mg/dL - 0.5 0.5  Alkaline Phos 39 - 117 U/L - 58 53  AST 0 - 37 U/L - 19 21  ALT 0 - 35 U/L - 24 19   Office blood pressures are: BP Readings from Last 3 Encounters:  12/09/19 116/70  10/06/19 138/82  09/07/19 (!) 145/86   BP goal < 140/90 mmHg Patient has failed these meds in the past: none Patient checks BP at home 3-5x per week   Patient is currently controlled on the following medications:   Losartan 100 mg - 1 tablet daily (AM)  Hydrochlorothiazide 25 mg - 1 tablet daily (AM)  Update 05/11/20: No home BP readings today. Clinic readings within goal. Refills timely, meds are being synced through [facility].  Plan: Continue current medications   Medication Management   Patient's preferred pharmacy is:  [facility] - [place], [place] - [address] [place] [address] Phone: [phone] Fax: [phone]  All medications are synced except Losartan - plan to resync 07/01/20 to others  OTCs: biotin 5 mg - 1 daily, vitamin D 2000 IU -1 daily, OTC potassium citrate 99 mg, 2 tab in am, 1 in pm Adherence: denies missed doses  Affordability: denies  concerns  CCM Follow Up:  6 months, telephone  Debbora Dus, PharmD Clinical Pharmacist [facility] at [facility] [phone]

## 2020-05-22 NOTE — Patient Instructions (Signed)
Dear Regina Chapman,  Below is a summary of the goals we discussed during our follow up appointment on May 11, 2020. Please contact me anytime with questions or concerns.   Visit Information  Goals Addressed            This Visit's Progress   . Pharmacy Care Plan       CARE PLAN ENTRY  Current Barriers:  . Chronic Disease Management support, education, and care coordination needs related to Hypertension, Hyperlipidemia, and Depression   Hypertension BP Readings from Last 3 Encounters:  10/06/19 138/82  09/07/19 (!) 145/86  02/16/19 132/70 .  Pharmacist Clinical Goal(s): o Over the next 3 months, patient will work with PharmD and providers to maintain BP goal <140/90 mmHg . Current regimen:   Losartan 100 mg - 1 tablet daily (AM)  Hydrochlorothiazide 25 mg - 1 tablet daily (AM) . Interventions: o Reviewed home blood pressure monitoring log, BP is within goal . Patient self care activities - Over the next 3 months, patient will: o Continue to check BP several days per week, document, and provide at future appointments o Ensure daily salt intake < 2300 mg/day  Hyperlipidemia Lab Results  Component Value Date/Time   LDLCALC 131 (H) 04/04/2015 09:36 AM   LDLDIRECT 183.9 02/09/2013 11:33 AM .  Pharmacist Clinical Goal(s): o Over the next 3 months, patient will work with PharmD and providers to achieve LDL goal < 100 . Current regimen:  o Omega 3 fatty acids (EPA/DHA) 1200 mg - 2 capsules once daily   Interventions: o Recommend repeat lipid panel at annual visit  o Consider starting a cholesterol medication to lower cardiovascular risk  . Patient self care activities - Over the next 3 months, patient will: . Slowly increase exercise with goal of 30 minutes, 5 days per week as tolerated . Incorporate a healthy diet high in vegetables, fruits and whole grains with low-fat dairy products, chicken, fish, legumes, non-tropical vegetable oils and nuts. Limit intake of  sweets, sugar-sweetened beverages and red meats.  Depression . Pharmacist Clinical Goal(s) o Over the next 3 months, patient will work with PharmD and providers to improve irritability . Current regimen:   Bupropion 200 mg 12 hr - 1 tablet twice daily  Trazodone 50 mg - 1-2 tablets at bedtime as needed for sleep . Interventions: o Reviewed current symptom control, patient reports good control with current therapy . Patient self care activities - Over the next 3 months, patient will: o Medications continued to be delivered through [facility]. o Continue trazodone as needed   Please see past updates related to this goal by clicking on the "Past Updates" button in the selected goal        Patient verbalizes understanding of instructions provided today and agrees to view in [place].   The pharmacy team will reach out to the patient again over the next 30 days.   Regina Chapman, PharmD Clinical Pharmacist [facility] at [facility] [phone]

## 2020-05-28 ENCOUNTER — Telehealth: Payer: Self-pay | Admitting: *Deleted

## 2020-05-28 NOTE — Telephone Encounter (Signed)
I don't mind seeing her tomorrow---but most sinus infections that have just started are due to viruses, and antibiotics are not indicated.. She may want to postpone the visit as I usually don't give antibiotics for sinus infections that have been going on under week

## 2020-05-28 NOTE — Telephone Encounter (Signed)
Talked with Dr [name]. We will do her virtual visit and decide if he does an antibiotic or have her contact us later for one.

## 2020-05-28 NOTE — Telephone Encounter (Signed)
Patient called stating that she thinks that she has a sinus infection and would like an antibiotic called in for her.Advised patient unfortunately our providers will not call in antibiotics without at least a virtual visit.  Patient stated that she started Friday with a headache, head congestion,cough and low grade fever. Patient denies SOB or difficulty breathing. Patient scheduled for a virtual visit with Dr. [name] tomorrow 05/29/20 at 7:30 am. Patient was advised to rest and drink lots of fluids. Patient was given ER precautions and she verbalized understanding.

## 2020-05-29 ENCOUNTER — Telehealth (INDEPENDENT_AMBULATORY_CARE_PROVIDER_SITE_OTHER): Payer: Medicare Other | Admitting: Internal Medicine

## 2020-05-29 ENCOUNTER — Encounter: Payer: Self-pay | Admitting: Internal Medicine

## 2020-05-29 ENCOUNTER — Other Ambulatory Visit: Payer: Self-pay

## 2020-05-29 DIAGNOSIS — Z20822 Contact with and (suspected) exposure to covid-19: Secondary | ICD-10-CM | POA: Diagnosis not present

## 2020-05-29 DIAGNOSIS — J0140 Acute pansinusitis, unspecified: Secondary | ICD-10-CM

## 2020-05-29 DIAGNOSIS — Z03818 Encounter for observation for suspected exposure to other biological agents ruled out: Secondary | ICD-10-CM | POA: Diagnosis not present

## 2020-05-29 NOTE — Progress Notes (Signed)
Subjective:    Patient ID: Regina Chapman, female    DOB: [birth year], 70 y.o.   MRN: [id number]  HPI Video virtual visit for respiratory symptoms Identification done Reviewed limitations and billing and she gave consent Participants---patient in her home and I am in my office  Started 4 days---just wasn't feeling good Sore throat and nasal congestion--and headache Some cough---and feels it in sinuses Low grade fever in evening (99.4) No SOB Some ear pain  Using aleve--helps the overall feel  Did go out with son and step daughter for meal Wore masks  Current Outpatient Medications on File Prior to Visit  Medication Sig Dispense Refill  . aspirin 81 MG tablet Take 81 mg by mouth 2 (two) times a week.      . Biotin 10000 MCG TABS Take 1 tablet by mouth daily.    Marland Kitchen buPROPion (WELLBUTRIN SR) 200 MG 12 hr tablet Take 1 tablet (200 mg total) by mouth 2 (two) times daily. 180 tablet 3  . cholecalciferol (VITAMIN D) 1000 units tablet Take 1,000 Units by mouth daily.    . hydrochlorothiazide (HYDRODIURIL) 25 MG tablet TAKE 1 TABLET(25 MG) BY MOUTH DAILY 90 tablet 3  . losartan (COZAAR) 100 MG tablet TAKE 1 TABLET(100 MG) BY MOUTH DAILY 90 tablet 3  . Omega 3 1000 MG CAPS Take 2 capsules by mouth daily.    . potassium citrate (UROCIT-K) 10 MEQ (1080 MG) SR tablet Take 20 mEq by mouth 2 (two) times daily.    . traZODone (DESYREL) 50 MG tablet TAKE 1 TO 2 TABLETS(50 TO 100 MG) BY MOUTH AT BEDTIME AS NEEDED 180 tablet 3  . meclizine (ANTIVERT) 25 MG tablet Take 1 tablet (25 mg total) by mouth 3 (three) times daily as needed for dizziness. (Patient not taking: Reported on 05/29/2020) 30 tablet 2   No current facility-administered medications on file prior to visit.    Allergies  Allergen Reactions  . Augmentin [Amoxicillin-Pot Clavulanate] Nausea And Vomiting    Able to tolerate Amoxil  . Cortisone Other (See Comments)    Pt had depomedrol inj and next day had red face,puffiness in face  and slight swelling in face; no SOB or swelling in mouth,tongue or throat.    Past Medical History:  Diagnosis Date  . Cancer ([place])    Melanoma  . 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 Depression   . History of melanoma 1985  . Hyperlipidemia   . Hypertension   . Kidney stones   . Obesity   . Obesity   . Psoriasis     Past Surgical History:  Procedure Laterality Date  . ABDOMINAL HYSTERECTOMY    . CATARACT EXTRACTION W/ INTRAOCULAR LENS  IMPLANT, BILATERAL  2013  . COLOSTOMY  2007  . LAPAROSCOPIC ASSISTED VAGINAL HYSTERECTOMY  1987   FOR ENDOMETRIOSIS  . LUNG REMOVAL, PARTIAL    . MELANOMA EXCISION  1985  . OOPHORECTOMY      Family History  Problem Relation Age of Onset  . Cervical cancer Sister        CERVICAL   . Heart disease Mother   . Dementia Mother   . Breast cancer Other   . Diabetes Neg Hx   . Hypertension Neg Hx   . Colon cancer Neg Hx     Social History   Socioeconomic History  . Marital status: Widowed    Spouse name: Not on file  . Number of children: 1  . Years of  education: Not on file  . Highest education level: Not on file  Occupational History  . Occupation: Radiographer, therapeutic service    Comment: Retired 2013  Tobacco Use  . Smoking status: Never Smoker  . Smokeless tobacco: Never Used  Substance and Sexual Activity  . Alcohol use: Yes    Alcohol/week: 3.0 standard drinks    Types: 3 Glasses of wine per week    Comment: 3 PER WEEK  . Drug use: No  . Sexual activity: Never    Birth control/protection: Surgical  Other Topics Concern  . Not on file  Social History Narrative   No living will or health care POA   Would want son to make decisions   Would accept resuscitation but no prolonged ventilation   No tube feeds if cognitively unaware   Social Determinants of Health   Financial Resource Strain: Not on file  Food Insecurity: Not on file  Transportation Needs: Not on file  Physical Activity: Not on file  Stress: Not on  file  Social Connections: Not on file  Intimate Partner Violence: Not on file   Review of Systems Slight nausea yesterday---no vomiting Appetite is off--but making herself eat Hasn't lost taste or smell    Objective:   Physical Exam Constitutional:      Appearance: Normal appearance.  Pulmonary:     Effort: Pulmonary effort is normal. No respiratory distress.     Comments: Occasional dry cough Neurological:     Mental Status: She is alert.            Assessment & Plan:

## 2020-05-29 NOTE — Assessment & Plan Note (Signed)
Clearly seems to be viral---likely COVID She plans to go to [facility] today to be tested Discussed supportive care If COVID positive---stay isolated for the rest of this week Not particularly sick, so won't refer for Rx even if positive  Regardless, I recommended booster when she feels better  If sinus symptoms persist to next week, would treat empirically with amoxicillin

## 2020-05-30 MED ORDER — HYDROCODONE-HOMATROPINE 5-1.5 MG/5ML PO SYRP: 5.0000 mL | ORAL_SOLUTION | Freq: Every evening | ORAL | 0 refills | Status: DC | PRN

## 2020-06-20 ENCOUNTER — Telehealth: Payer: Self-pay

## 2020-06-20 NOTE — Chronic Care Management (AMB) (Addendum)
Chronic Care Management Pharmacy Assistant   Name: Regina Chapman  MRN: [id number] DOB: [birth year]  Reason for Encounter: Medication Review- Medication Adherence and Delivery Coordination  Patient Question:  1.  Have you seen any other providers since your last visit? Yes  05/29/20 Video Visit- Dr. [name] 05/30/20- Dr. [name]- short course of cough syrup  PCP : [name], MD  Allergies:   Allergies  Allergen Reactions   Augmentin [Amoxicillin-Pot Clavulanate] Nausea And Vomiting    Able to tolerate Amoxil   Cortisone Other (See Comments)    Pt had depomedrol inj and next day had red face,puffiness in face and slight swelling in face; no SOB or swelling in mouth,tongue or throat.    Medications: Outpatient Encounter Medications as of 06/20/2020  Medication Sig   aspirin 81 MG tablet Take 81 mg by mouth 2 (two) times a week.     Biotin 10000 MCG TABS Take 1 tablet by mouth daily.   buPROPion (WELLBUTRIN SR) 200 MG 12 hr tablet Take 1 tablet (200 mg total) by mouth 2 (two) times daily.   cholecalciferol (VITAMIN D) 1000 units tablet Take 1,000 Units by mouth daily.   hydrochlorothiazide (HYDRODIURIL) 25 MG tablet TAKE 1 TABLET(25 MG) BY MOUTH DAILY   HYDROcodone-homatropine (HYCODAN) 5-1.5 MG/5ML syrup Take 5 mLs by mouth at bedtime as needed for cough.   losartan (COZAAR) 100 MG tablet TAKE 1 TABLET(100 MG) BY MOUTH DAILY   meclizine (ANTIVERT) 25 MG tablet Take 1 tablet (25 mg total) by mouth 3 (three) times daily as needed for dizziness. (Patient not taking: Reported on 05/29/2020)   Omega 3 1000 MG CAPS Take 2 capsules by mouth daily.   potassium citrate (UROCIT-K) 10 MEQ (1080 MG) SR tablet Take 20 mEq by mouth 2 (two) times daily.   traZODone (DESYREL) 50 MG tablet TAKE 1 TO 2 TABLETS(50 TO 100 MG) BY MOUTH AT BEDTIME AS NEEDED   No facility-administered encounter medications on file as of 06/20/2020.    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  Recent OV, Consult or Hospital visit: 05/29/20- Dr. [name]- PCP video visit.  No medication changes indicated- given short term cough syrup on 05/30/20  BP Readings from Last 3 Encounters:  05/29/20 (!) 145/63  12/09/19 116/70  10/06/19 138/82    No results found for: HGBA1C   Patient obtains medications through Vials  90 Days   Last adherence delivery date: 05/28/20, 90 day supply Biotin 5 mg Take 1 capsule daily  Vitamin D3 50 mcg- Take one capsule by mouth daily Hydrochlorothiazide 25 mg tablet-Take one tablet by mouth daily Bupropion 200mg  12 HR Tablet- Take one tablet by mouth two times daily Trazodone 50 mg tablet- Take 1 to 2 tablet by mouth at bedtime as needed  Patient declined the following medications last delivery: Losartan 100 mg tablet- Take 1 tablet by mouth daily - due to 90 DS on 03/30/20  Omega 3 1000 mg Capsule- Take 2 capsules by mouth daily (  purchases from [place]) Potassium Citrate 10 meq  - DISCONTINUED, taking OTC potassium now per urology   Patient is due for next adherence delivery on: 06/27/20  Contacted patient and reviewed medications and coordinated delivery.  This delivery to include: Vials - short fill to run out 08/26/20 with other medications  Losartan 100 mg tablet- Take 1 tablet by mouth daily   Patient declined the following medications this month due to receiving 90 DS on 05/28/20 OTC Biotin 5 mg Take 1 capsule daily (breakfast) OTC Vitamin D3 50 mcg- Take  one capsule by mouth daily Hydrochlorothiazide 25 mg tablet-Take one tablet by mouth daily Bupropion 200mg  12 HR Tablet- Take one tablet by mouth twice daily Trazodone 50 mg tablet- Take 1 to 2 tablet by mouth at bedtime as needed Buys OTC OTC Omega 3 1000 mg capsule- Take 2 capsules by mouth daily (purchases from [facility]) OTC potassium citrate 99 mg - Take 2 tablets in the morning and 1 in the evening (purchases from [place])  Patient does not need refills on any medications.  Confirmed delivery date of 06/27/20, advised patient that pharmacy will contact her the morning of delivery.  Recent blood pressure readings are as follows:  143/69 138/73 125/63 127/69  She does not have pulse readings or dates for her blood pressure readings. Patient states she had recent diagnosis of Covid-19 earlier this month. She is feeling much better now. Denies taking any cold or sinus medications currently.   Follow-Up:  Coordination of Enhanced Pharmacy Services and Pharmacist Review  Debbora Dus, CPP notified  Margaretmary Dys, [place] Pharmacy Assistant [phone]  I have reviewed the care management and care coordination activities outlined in this encounter and I am certifying that I agree with the content of this note. No further action required.  Debbora Dus, PharmD Clinical Pharmacist [facility] at [facility] [phone]

## 2020-07-03 DIAGNOSIS — C7951 Secondary malignant neoplasm of bone: Secondary | ICD-10-CM | POA: Diagnosis not present

## 2020-07-03 DIAGNOSIS — D3A8 Other benign neuroendocrine tumors: Secondary | ICD-10-CM | POA: Diagnosis not present

## 2020-07-03 DIAGNOSIS — C7B09 Secondary carcinoid tumors of other sites: Secondary | ICD-10-CM | POA: Diagnosis not present

## 2020-07-03 DIAGNOSIS — C7A090 Malignant carcinoid tumor of the bronchus and lung: Secondary | ICD-10-CM | POA: Diagnosis not present

## 2020-07-03 DIAGNOSIS — R918 Other nonspecific abnormal finding of lung field: Secondary | ICD-10-CM | POA: Diagnosis not present

## 2020-07-03 DIAGNOSIS — C313 Malignant neoplasm of sphenoid sinus: Secondary | ICD-10-CM | POA: Diagnosis not present

## 2020-07-03 DIAGNOSIS — C7B8 Other secondary neuroendocrine tumors: Secondary | ICD-10-CM | POA: Diagnosis not present

## 2020-07-03 DIAGNOSIS — N201 Calculus of ureter: Secondary | ICD-10-CM | POA: Diagnosis not present

## 2020-07-03 DIAGNOSIS — G9389 Other specified disorders of brain: Secondary | ICD-10-CM | POA: Diagnosis not present

## 2020-07-03 DIAGNOSIS — N2882 Megaloureter: Secondary | ICD-10-CM | POA: Diagnosis not present

## 2020-07-03 DIAGNOSIS — C784 Secondary malignant neoplasm of small intestine: Secondary | ICD-10-CM | POA: Diagnosis not present

## 2020-07-03 DIAGNOSIS — C7802 Secondary malignant neoplasm of left lung: Secondary | ICD-10-CM | POA: Diagnosis not present

## 2020-07-03 DIAGNOSIS — C7A8 Other malignant neuroendocrine tumors: Secondary | ICD-10-CM | POA: Diagnosis not present

## 2020-07-03 DIAGNOSIS — C7801 Secondary malignant neoplasm of right lung: Secondary | ICD-10-CM | POA: Diagnosis not present

## 2020-07-03 DIAGNOSIS — C7A010 Malignant carcinoid tumor of the duodenum: Secondary | ICD-10-CM | POA: Diagnosis not present

## 2020-07-03 DIAGNOSIS — Z902 Acquired absence of lung [part of]: Secondary | ICD-10-CM | POA: Diagnosis not present

## 2020-07-09 DIAGNOSIS — N200 Calculus of kidney: Secondary | ICD-10-CM | POA: Diagnosis not present

## 2020-08-01 DIAGNOSIS — M48062 Spinal stenosis, lumbar region with neurogenic claudication: Secondary | ICD-10-CM | POA: Diagnosis not present

## 2020-08-01 DIAGNOSIS — M5136 Other intervertebral disc degeneration, lumbar region: Secondary | ICD-10-CM | POA: Diagnosis not present

## 2020-08-01 DIAGNOSIS — M5416 Radiculopathy, lumbar region: Secondary | ICD-10-CM | POA: Diagnosis not present

## 2020-08-13 ENCOUNTER — Other Ambulatory Visit: Payer: Self-pay | Admitting: Internal Medicine

## 2020-08-15 ENCOUNTER — Other Ambulatory Visit: Payer: Self-pay | Admitting: Internal Medicine

## 2020-08-15 ENCOUNTER — Telehealth: Payer: Self-pay

## 2020-08-15 NOTE — Chronic Care Management (AMB) (Addendum)
Chronic Care Management Pharmacy Assistant   Name: Regina Chapman  MRN: [id number] DOB: Jul 31, 1950  Reason for Encounter: Medication Review,Medication adherence and delivery coordination  Recent office visits:  None since last CCM contact  Recent consult visits:  08/01/2020  Sharlet Salina DO, [facility] - No medication changes  07/03/2020  Leslie Andrea, [place] - No medications changes  Hospital visits:  None in previous 6 months  Medications: Outpatient Encounter Medications as of 08/15/2020  Medication Sig   aspirin 81 MG tablet Take 81 mg by mouth 2 (two) times a week.     Biotin 10000 MCG TABS Take 1 tablet by mouth daily.   buPROPion (WELLBUTRIN SR) 200 MG 12 hr tablet TAKE ONE TABLET BY MOUTH TWICE DAILY   cholecalciferol (VITAMIN D) 1000 units tablet Take 1,000 Units by mouth daily.   hydrochlorothiazide (HYDRODIURIL) 25 MG tablet TAKE ONE TABLET BY MOUTH ONCE DAILY   HYDROcodone-homatropine (HYCODAN) 5-1.5 MG/5ML syrup Take 5 mLs by mouth at bedtime as needed for cough.   losartan (COZAAR) 100 MG tablet TAKE ONE TABLET BY MOUTH ONCE DAILY   meclizine (ANTIVERT) 25 MG tablet Take 1 tablet (25 mg total) by mouth 3 (three) times daily as needed for dizziness. (Patient not taking: Reported on 05/29/2020)   Omega 3 1000 MG CAPS Take 2 capsules by mouth daily.   potassium citrate (UROCIT-K) 10 MEQ (1080 MG) SR tablet Take 20 mEq by mouth 2 (two) times daily.   traZODone (DESYREL) 50 MG tablet TAKE 1 TO 2 TABLETS(50 TO 100 MG) BY MOUTH AT BEDTIME AS NEEDED   No facility-administered encounter medications on file as of 08/15/2020.    BP Readings from Last 3 Encounters:  05/29/20 (!) 145/63  12/09/19 116/70  10/06/19 138/82    No results found for: HGBA1C    No medication changes indicated  Patient obtains medications through Vials  90 Days   Last adherence delivery date: 06/27/2020 Vials - sync to other meds please  Losartan 100 mg tablet- Take 1 tablet by  mouth daily - *Short fill to match other synced medications*   Medications not needed:   Due to 90 DS on 05/28/20  OTC Biotin 5 mg Take 1 capsule daily (breakfast)  OTC Vitamin D3 50 mcg- Take one capsule by mouth daily  Hydrochlorothiazide 25 mg tablet-Take one tablet by mouth daily  Bupropion 200mg  12 HR Tablet- Take one tablet by mouth two time daily  Trazodone 50 mg tablet- Take 1 to 2 tablet by mouth at bedtime as needed   Buys OTC  OTC Omega 3 1000 mg Capsule- Take 2 capsules by mouth daily (purchases from [place])  OTC Potassium 99 mg - 2 in the morning and 1 in the evening    Patient is due for next adherence delivery on: 08/24/2020  Spoke with patient on 08/20/2020 and reviewed medications and coordinated delivery.  This delivery to include: Vials  90 Days  Hydrochlorothiazide 25 mg tablet-Take one tablet by mouth daily Bupropion 200 mg 12 HR Tablet- Take one tablet by mouth two time daily Losartan potassium 100 mg - Take one tablet by mouth once daily  PRN/VIAL medications: None requested  Patient declined the following medications this month: OTC Biotin 5 mg Take 1 capsule daily (breakfast) Pt. reports she has adequate supply  OTC Vitamin D3 50 mcg- Take one capsule by mouth daily pt. reports she has adequate supply Trazodone 50 mg tablet- Take 1 to 2 tablet by mouth  at bedtime as needed pt. reports she has adequate supply   Buys OTC OTC Omega 3 1000 mg Capsule- Take 2 capsules by mouth daily (purchases from [place]) OTC Potassium 99 mg - 2 in the morning and 1 in the evening    Patient needs refills on the following medications: none  Confirmed delivery date of 08/24/2020 advised patient that pharmacy will contact her the morning of delivery.   Recent blood pressure readings are as follows:  138/67  -P  127/62  -P  144/75  -P  Recent blood glucose readings are as follows:  Fasting:  The patient reports  90 avg. Before Meals: n/a After Meals: n/a Bedtime:  n/a  Star Rating Drugs:  Medication:  Last Fill: Day Supply Losartan 100mg . 06/26/2020 60ds   Follow-Up:  Coordination of Enhanced Pharmacy Services and Pharmacist Review  Debbora Dus, CPP notified  Avel Sensor, [place] Assistant [phone]  I have reviewed the care management and care coordination activities outlined in this encounter and I am certifying that I agree with the content of this note. No further action required.  Debbora Dus, PharmD Clinical Pharmacist [facility] at [facility] [phone]

## 2020-08-21 DIAGNOSIS — M4726 Other spondylosis with radiculopathy, lumbar region: Secondary | ICD-10-CM | POA: Diagnosis not present

## 2020-08-21 DIAGNOSIS — Z79899 Other long term (current) drug therapy: Secondary | ICD-10-CM | POA: Diagnosis not present

## 2020-08-21 DIAGNOSIS — M5416 Radiculopathy, lumbar region: Secondary | ICD-10-CM | POA: Diagnosis not present

## 2020-08-21 DIAGNOSIS — M48061 Spinal stenosis, lumbar region without neurogenic claudication: Secondary | ICD-10-CM | POA: Diagnosis not present

## 2020-08-21 DIAGNOSIS — M5136 Other intervertebral disc degeneration, lumbar region: Secondary | ICD-10-CM | POA: Diagnosis not present

## 2020-09-17 ENCOUNTER — Other Ambulatory Visit: Payer: Self-pay

## 2020-09-17 ENCOUNTER — Ambulatory Visit
Admission: EM | Admit: 2020-09-17 | Discharge: 2020-09-17 | Disposition: A | Discharge status: Home / Self Care | Payer: Medicare Other | Attending: Emergency Medicine | Admitting: Emergency Medicine

## 2020-09-17 DIAGNOSIS — R319 Hematuria, unspecified: Secondary | ICD-10-CM | POA: Diagnosis not present

## 2020-09-17 DIAGNOSIS — B9689 Other specified bacterial agents as the cause of diseases classified elsewhere: Secondary | ICD-10-CM | POA: Diagnosis not present

## 2020-09-17 DIAGNOSIS — N390 Urinary tract infection, site not specified: Secondary | ICD-10-CM | POA: Diagnosis not present

## 2020-09-17 LAB — POCT URINALYSIS DIP (MANUAL ENTRY)
Bilirubin, UA: NEGATIVE
Glucose, UA: NEGATIVE mg/dL
Ketones, POC UA: NEGATIVE mg/dL
Nitrite, UA: POSITIVE — AB
Protein Ur, POC: 30 mg/dL — AB
Spec Grav, UA: 1.025 (ref 1.010–1.025)
Urobilinogen, UA: 1 E.U./dL
pH, UA: 5.5 (ref 5.0–8.0)

## 2020-09-17 MED ORDER — CEPHALEXIN 500 MG PO CAPS: 500.0000 mg | ORAL_CAPSULE | Freq: Two times a day (BID) | ORAL | 0 refills | Status: AC

## 2020-09-17 NOTE — ED Triage Notes (Addendum)
Patient presents to Urgent Care with complaints of flank pain, diarrhea,  abdominal pain, nausea, increased urinary freq. Since Friday. And has noted a odor with her urine. Treating discomfort with  aleve and tramadol. Pt states has a hx of UTIs and kidney stones. Unable to see her urologist for current issue no available appt at this time.    Denies fever or hematuria.

## 2020-09-17 NOTE — Discharge Instructions (Addendum)
Take the antibiotic as directed.  The urine culture is pending.  We will call you if it shows the need to change or discontinue your antibiotic.    Follow up with your primary care provider or urologist if your symptoms are not improving.

## 2020-09-17 NOTE — ED Provider Notes (Signed)
Roderic Palau    CSN: [id number] Arrival date & time: 09/17/20  1036      History   Chief Complaint Chief Complaint  Patient presents with  . Nausea  . Abdominal Pain  . Flank Pain    HPI Regina Chapman is a 70 y.o. female.   Patient presents with dysuria x4 days.  She also reports urinary frequency, lower abdominal pain, flank pain, nausea, diarrhea.  Treatment attempted at home with Aleve and tramadol.  She denies fever, chills, vomiting, vaginal symptoms, or other symptoms.  Her medical history includes kidney stones, recurrent UTIs, obesity, hypertension, depression.    The history is provided by the patient and medical records.    Past Medical History:  Diagnosis Date  . Cancer ([place])    Melanoma  . 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 Depression   . History of melanoma 1985  . Hyperlipidemia   . Hypertension   . Kidney stones   . Obesity   . Obesity   . Psoriasis     Patient Active Problem List   Diagnosis Date Noted  . Vertigo 10/06/2019  . Kidney stones 12/07/2018  . Acute non-recurrent pansinusitis 10/08/2017  . Neuropathy 09/26/2016  . Advance directive discussed with patient 07/20/2015  . Metastatic carcinoid tumor ([place]) 04/04/2015  . Obesity   . Routine general medical examination at a health care facility 02/09/2013  . Hyperlipidemia   . Hypertension   . Recurrent major depression in partial remission ([place]) 09/16/2008    Past Surgical History:  Procedure Laterality Date  . ABDOMINAL HYSTERECTOMY    . CATARACT EXTRACTION W/ INTRAOCULAR LENS  IMPLANT, BILATERAL  2013  . COLOSTOMY  2007  . LAPAROSCOPIC ASSISTED VAGINAL HYSTERECTOMY  1987   FOR ENDOMETRIOSIS  . LUNG REMOVAL, PARTIAL    . MELANOMA EXCISION  1985  . OOPHORECTOMY      OB History    Gravida  1   Para  1   Term  1   Preterm  0   AB  0   Living  1     SAB  0   IAB  0   Ectopic  0   Multiple  0   Live Births  1            Home Medications     Prior to Admission medications   Medication Sig Start Date End Date Taking? Authorizing Provider  cephALEXin (KEFLEX) 500 MG capsule Take 1 capsule (500 mg total) by mouth 2 (two) times daily for 5 days. 09/17/20 09/22/20 Yes Sharion Balloon, NP  aspirin 81 MG tablet Take 81 mg by mouth 2 (two) times a week.      [provider]  Biotin 10000 MCG TABS Take 1 tablet by mouth daily.    [provider]  buPROPion (WELLBUTRIN SR) 200 MG 12 hr tablet TAKE ONE TABLET BY MOUTH TWICE DAILY 08/13/20   Viviana Simpler I, MD  cholecalciferol (VITAMIN D) 1000 units tablet Take 1,000 Units by mouth daily.    [provider]  hydrochlorothiazide (HYDRODIURIL) 25 MG tablet TAKE ONE TABLET BY MOUTH ONCE DAILY 08/13/20   [name], MD  HYDROcodone-homatropine [facility]) 5-1.5 MG/5ML syrup Take 5 mLs by mouth at bedtime as needed for cough. 05/30/20   [name], MD  losartan (COZAAR) 100 MG tablet TAKE ONE TABLET BY MOUTH ONCE DAILY 08/15/20   [name], MD  meclizine (ANTIVERT) 25 MG tablet  Take 1 tablet (25 mg total) by mouth 3 (three) times daily as needed for dizziness. Patient not taking: Reported on 05/29/2020 10/06/19   [name], MD  Omega 3 1000 MG CAPS Take 2 capsules by mouth daily.    [provider]  potassium citrate (UROCIT-K) 10 MEQ (1080 MG) SR tablet Take 20 mEq by mouth 2 (two) times daily.    [provider]  traZODone (DESYREL) 50 MG tablet TAKE 1 TO 2 TABLETS(50 TO 100 MG) BY MOUTH AT BEDTIME AS NEEDED 05/11/20   [name], MD    Family History Family History  Problem Relation Age of Onset  . Cervical cancer Sister        CERVICAL   . Heart disease Mother   . Dementia Mother   . Breast cancer Other   . Diabetes Neg Hx   . Hypertension Neg Hx   . Colon cancer Neg Hx     Social History Social History   Tobacco Use  . Smoking status: Never Smoker  . Smokeless tobacco: Never Used  Substance Use Topics  .  Alcohol use: Yes    Alcohol/week: 3.0 standard drinks    Types: 3 Glasses of wine per week    Comment: 3 PER WEEK  . Drug use: No     Allergies   Augmentin [amoxicillin-pot clavulanate] and Cortisone   Review of Systems Review of Systems  Constitutional: Negative for chills and fever.  Respiratory: Negative for cough and shortness of breath.   Cardiovascular: Negative for chest pain and palpitations.  Gastrointestinal: Positive for abdominal pain, diarrhea and nausea. Negative for vomiting.  Genitourinary: Positive for dysuria, flank pain and frequency. Negative for hematuria, pelvic pain and vaginal discharge.  Skin: Negative for color change and rash.  All other systems reviewed and are negative.    Physical Exam Triage Vital Signs ED Triage Vitals  Enc Vitals Group     BP      Pulse      Resp      Temp      Temp src      SpO2      Weight      Height      Head Circumference      Peak Flow      Pain Score      Pain Loc      Pain Edu?      Excl. in [place]?    No data found.  Updated Vital Signs BP (!) 145/69 (BP Location: Left Arm)   Pulse 82   Temp (!) 97.3 F (36.3 C) (Temporal)   Resp 16   SpO2 97%   Visual Acuity Right Eye Distance:   Left Eye Distance:   Bilateral Distance:    Right Eye Near:   Left Eye Near:    Bilateral Near:     Physical Exam Vitals and nursing note reviewed.  Constitutional:      General: She is not in acute distress.    Appearance: She is well-developed. She is not ill-appearing.  HENT:     Head: Normocephalic and atraumatic.     Mouth/Throat:     Mouth: Mucous membranes are moist.  Eyes:     Conjunctiva/sclera: Conjunctivae normal.  Cardiovascular:     Rate and Rhythm: Normal rate and regular rhythm.     Heart sounds: Normal heart sounds.  Pulmonary:     Effort: Pulmonary effort is normal. No respiratory distress.  Breath sounds: Normal breath sounds.  Abdominal:     General: Bowel sounds are normal.      Palpations: Abdomen is soft.     Tenderness: There is no abdominal tenderness. There is no right CVA tenderness, left CVA tenderness, guarding or rebound.  Musculoskeletal:     Cervical back: Neck supple.  Skin:    General: Skin is warm and dry.  Neurological:     General: No focal deficit present.     Mental Status: She is alert and oriented to person, place, and time.     Gait: Gait normal.  Psychiatric:        Mood and Affect: Mood normal.        Behavior: Behavior normal.      UC Treatments / Results  Labs (all labs ordered are listed, but only abnormal results are displayed) Labs Reviewed  POCT URINALYSIS DIP (MANUAL ENTRY) - Abnormal; Notable for the following components:      Result Value   Blood, UA moderate (*)    Protein Ur, POC =30 (*)    Nitrite, UA Positive (*)    Leukocytes, UA Small (1+) (*)    All other components within normal limits  URINE CULTURE    EKG   Radiology No results found.  Procedures Procedures (including critical care time)  Medications Ordered in UC Medications - No data to display  Initial Impression / Assessment and Plan / UC Course  I have reviewed the triage vital signs and the nursing notes.  Pertinent labs & imaging results that were available during my care of the patient were reviewed by me and considered in my medical decision making (see chart for details).   UTI.  Urine culture pending.  Treating with Keflex.  Discussed with patient we will call her if the culture shows the need to change or discontinue the antibiotic.  Instructed her to follow-up with her PCP or urologist if her symptoms are not improving.  She agrees to plan of care.   Final Clinical Impressions(s) / UC Diagnoses   Final diagnoses:  Urinary tract infection with hematuria, site unspecified     Discharge Instructions     Take the antibiotic as directed.  The urine culture is pending.  We will call you if it shows the need to change or discontinue  your antibiotic.    Follow up with your primary care provider or urologist if your symptoms are not improving.        ED Prescriptions    Medication Sig Dispense Auth. Provider   cephALEXin (KEFLEX) 500 MG capsule Take 1 capsule (500 mg total) by mouth 2 (two) times daily for 5 days. 10 capsule Sharion Balloon, NP     PDMP not reviewed this encounter.   Sharion Balloon, NP 09/17/20 1137

## 2020-09-18 LAB — URINE CULTURE: Culture: <10000 — AB

## 2020-10-22 DIAGNOSIS — N202 Calculus of kidney with calculus of ureter: Secondary | ICD-10-CM | POA: Diagnosis not present

## 2020-10-22 DIAGNOSIS — N201 Calculus of ureter: Secondary | ICD-10-CM | POA: Diagnosis not present

## 2020-10-22 DIAGNOSIS — N200 Calculus of kidney: Secondary | ICD-10-CM | POA: Diagnosis not present

## 2020-11-07 ENCOUNTER — Telehealth: Payer: Self-pay

## 2020-11-07 NOTE — Chronic Care Management (AMB) (Addendum)
    Chronic Care Management Pharmacy Assistant   Name: SARINA ROBLETO  MRN: [id number] DOB: 1950/08/31  Reason for Encounter: CCM Reminder Call   Conditions to be addressed/monitored: HTN and HLD   Medications: Outpatient Encounter Medications as of 11/07/2020  Medication Sig   aspirin 81 MG tablet Take 81 mg by mouth 2 (two) times a week.     Biotin 10000 MCG TABS Take 1 tablet by mouth daily.   buPROPion (WELLBUTRIN SR) 200 MG 12 hr tablet TAKE ONE TABLET BY MOUTH TWICE DAILY   cholecalciferol (VITAMIN D) 1000 units tablet Take 1,000 Units by mouth daily.   hydrochlorothiazide (HYDRODIURIL) 25 MG tablet TAKE ONE TABLET BY MOUTH ONCE DAILY   HYDROcodone-homatropine (HYCODAN) 5-1.5 MG/5ML syrup Take 5 mLs by mouth at bedtime as needed for cough.   losartan (COZAAR) 100 MG tablet TAKE ONE TABLET BY MOUTH ONCE DAILY   meclizine (ANTIVERT) 25 MG tablet Take 1 tablet (25 mg total) by mouth 3 (three) times daily as needed for dizziness. (Patient not taking: Reported on 05/29/2020)   Omega 3 1000 MG CAPS Take 2 capsules by mouth daily.   potassium citrate (UROCIT-K) 10 MEQ (1080 MG) SR tablet Take 20 mEq by mouth 2 (two) times daily.   traZODone (DESYREL) 50 MG tablet TAKE 1 TO 2 TABLETS(50 TO 100 MG) BY MOUTH AT BEDTIME AS NEEDED   No facility-administered encounter medications on file as of 11/07/2020.   Regina Chapman was contacted to remind her of her upcoming telephone visit with Debbora Dus on 11/08/2020 at 8:30AM.  She was reminded by voicemail to have all medications, supplements and any blood glucose and blood pressure readings available for review at appointment. Patient did not answer the phone for any discussion prior to tomorrow's appointment.  Star Rating Drugs: Medication:  Last Fill: Day Supply Losartan 100mg  08/23/2020 90  PCP appointment on 12/12/2020 and CCM appointment on 11/08/2020  Debbora Dus, CPP notified  Avel Sensor, [place]  Assistant [phone]  I have reviewed the care management and care coordination activities outlined in this encounter and I am certifying that I agree with the content of this note. No further action required.  Debbora Dus, PharmD Clinical Pharmacist [facility] at [facility] [phone]

## 2020-11-08 ENCOUNTER — Ambulatory Visit (INDEPENDENT_AMBULATORY_CARE_PROVIDER_SITE_OTHER): Payer: Medicare Other

## 2020-11-08 ENCOUNTER — Other Ambulatory Visit: Payer: Self-pay

## 2020-11-08 DIAGNOSIS — F3341 Major depressive disorder, recurrent, in partial remission: Secondary | ICD-10-CM

## 2020-11-08 DIAGNOSIS — E785 Hyperlipidemia, unspecified: Secondary | ICD-10-CM

## 2020-11-08 DIAGNOSIS — I10 Essential (primary) hypertension: Secondary | ICD-10-CM | POA: Diagnosis not present

## 2020-11-08 NOTE — Progress Notes (Signed)
Chronic Care Management Pharmacy Note  11/08/20 Name:  ANGELIQUE CHEVALIER MRN:  [id number] DOB:  [birth year]  Subjective: Regina Chapman is an 70 y.o. year old female who is a primary patient of Letvak, Theophilus Kinds, MD.  The CCM team was consulted for assistance with disease management and care coordination needs.    Engaged with patient by telephone for follow up visit in response to provider referral for pharmacy case management and/or care coordination services. She reports recently started acupuncture for sciatica. No recent medication changes.  Consent to Services:  The patient was given information about Chronic Care Management services, agreed to services, and gave verbal consent prior to initiation of services.  Please see initial visit note for detailed documentation.   Patient Care Team: [name], MD as PCP - General Debbora Dus, [facility] as Pharmacist (Pharmacist)  Recent office visits:  None since last CCM contact   Recent consult visits:  10/22/20 - Urology - Patient presented for recurrent nephrolithiasis. Persistent utereral stone, proceed for ureteroscopy.  08/21/20 - Orthopedic consult - Pt presented for initial evaluation of back pain. Continue with Aleve at night as needed. Continue with tramadol 50 mg 1 tablet p.o. twice daily as needed #14 sent to pharmacy.Follow-up with our office in 6 months for reevaluation.   Hospital visits:  None in past 6 months   Objective:  Lab Results  Component Value Date   CREATININE 0.77 07/07/2017   BUN 16 07/07/2017   GFR 79.38 07/07/2017   NA 138 07/07/2017   K 4.1 07/07/2017   CALCIUM 10.4 07/07/2017   CO2 32 07/07/2017   GLUCOSE 97 07/07/2017    Lab Results  Component Value Date/Time   GFR 79.38 07/07/2017 09:55 AM   GFR 78.21 06/22/2017 05:16 PM   MICROALBUR 1.1 06/22/2017 05:16 PM     Lab Results  Component Value Date   CHOL 187 04/04/2015   HDL 34.90 (L) 04/04/2015   LDLCALC 131 (H) 04/04/2015    LDLDIRECT 183.9 02/09/2013   TRIG 106.0 04/04/2015   CHOLHDL 5 04/04/2015    Hepatic Function Latest Ref Rng & Units 07/07/2017 06/22/2017 09/26/2016  Total Protein 6.0 - 8.3 g/dL - 7.9 7.8  Albumin 3.5 - 5.2 g/dL 4.2 4.3 4.4  AST 0 - 37 U/L - 19 21  ALT 0 - 35 U/L - 24 19  Alk Phosphatase 39 - 117 U/L - 58 53  Total Bilirubin 0.2 - 1.2 mg/dL - 0.5 0.5  Bilirubin, Direct 0.0 - 0.3 mg/dL - - -    Lab Results  Component Value Date/Time   TSH 1.21 02/09/2013 11:33 AM   TSH 1.678 03/13/2009 09:21 PM   FREET4 0.97 06/22/2017 05:16 PM   FREET4 1.54 09/26/2016 10:19 AM    CBC Latest Ref Rng & Units 06/22/2017 09/26/2016 01/20/2014  WBC 4.0 - 10.5 K/uL 9.8 7.7 8.8  Hemoglobin 12.0 - 15.0 g/dL 14.8 15.3(H) 15.6(H)  Hematocrit 36.0 - 46.0 % 43.2 45.8 46.4(H)  Platelets 150.0 - 400.0 K/uL 290.0 259.0 233.0    No results found for: VD25OH  Clinical ASCVD: No  The ASCVD Risk score Mikey Bussing DC Jr., et al., 2013) failed to calculate for the following reasons:   Cannot find a previous HDL lab   Cannot find a previous total cholesterol lab    Depression screen PHQ 2/9 12/04/2017  Decreased Interest 0  Down, Depressed, Hopeless 1  PHQ - 2 Score 1    Social History   Tobacco Use  Smoking Status Never  Smokeless Tobacco Never   BP Readings from Last 3 Encounters:  09/17/20 (!) 145/69  05/29/20 (!) 145/63  12/09/19 116/70   Pulse Readings from Last 3 Encounters:  09/17/20 82  05/29/20 77  12/09/19 73   Wt Readings from Last 3 Encounters:  12/09/19 176 lb (79.8 kg)  10/06/19 176 lb 12.8 oz (80.2 kg)  09/07/19 172 lb (78 kg)   BMI Readings from Last 3 Encounters:  12/09/19 32.72 kg/m  10/06/19 32.87 kg/m  09/07/19 31.97 kg/m    Assessment/Interventions: Review of patient past medical history, allergies, medications, health status, including review of consultants reports, laboratory and other test data, was performed as part of comprehensive evaluation and provision of chronic care  management services.   SDOH:  (Social Determinants of Health) assessments and interventions performed: Yes SDOH Interventions    Flowsheet Row Most Recent Value  SDOH Interventions   Financial Strain Interventions Intervention Not Indicated      SDOH Screenings   Alcohol Screen: Not on file  Depression (PHQ2-9): Not on file  Financial Resource Strain: Low Risk    Difficulty of Paying Living Expenses: Not very hard  Food Insecurity: Not on file  Housing: Not on file  Physical Activity: Not on file  Social Connections: Not on file  Stress: Not on file  Tobacco Use: Low Risk    Smoking Tobacco Use: Never   Smokeless Tobacco Use: Never  Transportation Needs: Not on file    CCM Care Plan  Allergies  Allergen Reactions   Augmentin [Amoxicillin-Pot Clavulanate] Nausea And Vomiting    Able to tolerate Amoxil   Cortisone Other (See Comments)    Pt had depomedrol inj and next day had red face,puffiness in face and slight swelling in face; no SOB or swelling in mouth,tongue or throat.    Medications Reviewed Today     Reviewed by Debbora Dus, [facility] (Pharmacist) on 11/08/20 at [phone]  Med List Status: <None>   Medication Order Taking? Sig Documenting Provider Last Dose Status Informant  Biotin 10000 MCG TABS [id number] Yes Take 1 tablet by mouth daily. [provider] Taking Active   buPROPion [facility] SR) 200 MG 12 hr tablet [id number] Yes TAKE ONE TABLET BY MOUTH TWICE DAILY [name], MD Taking Active   cholecalciferol (VITAMIN D) 1000 units tablet [id number] Yes Take 1,000 Units by mouth daily. [provider] Taking Active   hydrochlorothiazide (HYDRODIURIL) 25 MG tablet [id number] Yes TAKE ONE TABLET BY MOUTH ONCE DAILY [name], MD Taking Active   losartan (COZAAR) 100 MG tablet [id number] Yes TAKE ONE TABLET BY MOUTH ONCE DAILY [name], MD Taking Active   Omega 3 1000 MG CAPS [id number] Yes Take 2 capsules by mouth daily. [provider] Taking Active   potassium citrate (UROCIT-K) 10 MEQ (1080 MG) SR tablet [id number] Yes Take 20 mEq by mouth 2 (two) times daily. [provider] Taking Active   traZODone (DESYREL) 50 MG tablet [id number] Yes TAKE 1 TO 2 TABLETS(50 TO 100 MG) BY MOUTH AT BEDTIME AS NEEDED [name], MD Taking Active             Patient Active Problem List   Diagnosis Date Noted   Vertigo 10/06/2019   Kidney stones 12/07/2018   Acute non-recurrent pansinusitis 10/08/2017   Neuropathy 09/26/2016   Advance directive discussed with patient 07/20/2015   Metastatic carcinoid tumor ([place]) 04/04/2015   Obesity    Routine general medical examination  at a health care facility 02/09/2013   Hyperlipidemia    Hypertension    Recurrent major depression in partial remission ([place]) 09/16/2008    Immunization History  Administered Date(s) Administered   Influenza, High Dose Seasonal PF 04/29/2016, 03/03/2017, 01/06/2019   Influenza, Seasonal, Injecte, Preservative Fre 01/23/2015   Influenza,inj,Quad PF,6+ Mos 02/09/2013, 01/20/2014   Influenza-Unspecified 02/11/2012, 02/22/2018   PFIZER(Purple Top)SARS-COV-2 Vaccination 06/22/2019, 07/19/2019   Pneumococcal Conjugate-13 07/20/2015   Pneumococcal Polysaccharide-23 09/26/2016   Tdap 02/09/2013    Conditions to be addressed/monitored:  Hypertension, Depression, and Anxiety  Care Plan : [place]  Updates made by Debbora Dus, [facility] since 11/26/2020 12:00 AM     Problem: Disease Management   Priority: High  Onset Date: 11/08/2020  Note:   Current Barriers:  None identified  Pharmacist Clinical Goal(s):  Patient will contact provider office for questions/concerns as evidenced notation of same in electronic health record through collaboration with PharmD and provider.   Interventions: 1:1 collaboration with [name], MD regarding development and update of comprehensive plan of care as evidenced by  provider attestation and co-signature Inter-disciplinary care team collaboration (see longitudinal plan of care) Comprehensive medication review performed; medication list updated in electronic medical record  Hypertension  (BP goal <140/90) -Controlled - per home BP readings -Current treatment: HCTZ 25 mg - 1 tablet daily Losartan 100 mg - 1 tablet daily -Medications previously tried: none   -Current home readings: checks once daily - today 126/68, usually 135-140/67-70 -Current dietary habits: she has increased vegetable intake -Current exercise habits: none formal, stays active  -Denies hypotensive/hypertensive symptoms. Denies falls or imbalance. -Educated on BP goals and benefits of medications for prevention of heart attack, stroke and kidney damage; -Counseled to monitor BP at home weekly, document, and provide log at future appointments -Recommended to continue current medication  Depression/Anxiety (Goal: Improve mood) -Controlled - per patient report -Current treatment: Bupropion 200 mg 12 hr - 1 tablet twice daily Trazodone 50 mg - 1-2 tablets at bedtime as needed  -Medications previously tried/failed: none reported -PHQ9: none since 2019, patient reports she is doing well on current therapy  -GAD7: none -Recommended to continue current medication  Patient Goals/Self-Care Activities Patient will:  - contact CCM team with any concerns  Follow Up Plan: Telephone follow up appointment with care management team member scheduled for:  12 months     Medication Assistance: None required.  Patient affirms current coverage meets needs.  Compliance/Adherence/Medication fill history: Care Gaps: Shingrix, Flu vaccine, COVID-19 booster Hepatitis C screening  Star-Rating Drugs: None  Patient's preferred pharmacy is:  [facility] - [place], [place] - [address] [place] [address] Phone: [phone] Fax:  [phone]  Uses pill box? Yes Pt endorses 100% compliance She gets her OTCs through [place] and Follow Up Patient Decision:  Patient agrees to Care Plan and Follow-up.   Debbora Dus, PharmD Clinical Pharmacist [place] [facility] at [facility] [phone]

## 2020-11-13 ENCOUNTER — Telehealth: Payer: Self-pay

## 2020-11-13 NOTE — Chronic Care Management (AMB) (Addendum)
    Chronic Care Management Pharmacy Assistant   Name: Regina Chapman  MRN: [id number] DOB: [birth year]  Reason for Encounter: Medication Adherence and Delivery Coordination   Recent office visits:  None since last CCM contact  Recent consult visits:  10/22/20 - Urology no data found 08/21/20 - [facility]  no data found  Hospital visits:  09/17/20 [facility] - Started keflex 500mg  take 1 tablet  2 times a day for 5 days  Medications: Outpatient Encounter Medications as of 11/13/2020  Medication Sig   Biotin 10000 MCG TABS Take 1 tablet by mouth daily.   buPROPion (WELLBUTRIN SR) 200 MG 12 hr tablet TAKE ONE TABLET BY MOUTH TWICE DAILY   cholecalciferol (VITAMIN D) 1000 units tablet Take 1,000 Units by mouth daily.   ciprofloxacin (CIPRO) 500 MG tablet Take 500 mg by mouth 2 (two) times daily.   hydrochlorothiazide (HYDRODIURIL) 25 MG tablet TAKE ONE TABLET BY MOUTH ONCE DAILY   losartan (COZAAR) 100 MG tablet TAKE ONE TABLET BY MOUTH ONCE DAILY   Omega 3 1000 MG CAPS Take 2 capsules by mouth daily.   potassium citrate (UROCIT-K) 10 MEQ (1080 MG) SR tablet Take 20 mEq by mouth 2 (two) times daily.   traMADol (ULTRAM) 50 MG tablet Take 50 mg by mouth 2 (two) times daily as needed.   traZODone (DESYREL) 50 MG tablet TAKE 1 TO 2 TABLETS(50 TO 100 MG) BY MOUTH AT BEDTIME AS NEEDED   No facility-administered encounter medications on file as of 11/13/2020.   BP Readings from Last 3 Encounters:  09/17/20 (!) 145/69  05/29/20 (!) 145/63  12/09/19 116/70    No results found for: HGBA1C    Patient obtains medications through Vials  90 Days   Last adherence delivery date:  08/24/2020      Patient is due for next adherence delivery on: 11/22/2020  This delivery to include: Vials  90 Days  Hydrochlorothiazide 25 mg tablet-Take one tablet by mouth daily Bupropion 200 mg 12 HR Tablet- Take one tablet by mouth two times daily Losartan potassium 100 mg - Take one  tablet by mouth once daily  Patient declined the following medications this month: OTC Omega 3 1000 mg Capsule- Take 2 capsules by mouth daily (purchases from [facility]) OTC Potassium 99 mg - 2 in the morning and 1 in the evening  OTC Biotin 5 mg Take 1 capsule daily (breakfast) Pt. reports she has adequate supply OTC Vitamin D3 50 mcg- Take one capsule by mouth daily pt. reports she has adequate supply Trazodone 50mg   take 1-2 tablets at bedtime use as needed - patient declines refill today  No refill request needed from PCP.  Confirmed delivery date of 11/22/2020, advised patient that pharmacy will contact her the morning of delivery.  Follow-Up:  Medication Cost Review and Pharmacist Review  Debbora Dus, CPP notified  Avel Sensor, [place] Assistant [phone]  I have reviewed the care management and care coordination activities outlined in this encounter and I am certifying that I agree with the content of this note. No further action required.  Debbora Dus, PharmD Clinical Pharmacist [place] [facility] at [facility] [phone]

## 2020-11-14 DIAGNOSIS — N201 Calculus of ureter: Secondary | ICD-10-CM | POA: Diagnosis not present

## 2020-11-14 DIAGNOSIS — N189 Chronic kidney disease, unspecified: Secondary | ICD-10-CM | POA: Diagnosis not present

## 2020-11-14 DIAGNOSIS — N132 Hydronephrosis with renal and ureteral calculous obstruction: Secondary | ICD-10-CM | POA: Diagnosis not present

## 2020-11-14 DIAGNOSIS — Z6833 Body mass index (BMI) 33.0-33.9, adult: Secondary | ICD-10-CM | POA: Diagnosis not present

## 2020-11-14 DIAGNOSIS — I129 Hypertensive chronic kidney disease with stage 1 through stage 4 chronic kidney disease, or unspecified chronic kidney disease: Secondary | ICD-10-CM | POA: Diagnosis not present

## 2020-11-14 DIAGNOSIS — E669 Obesity, unspecified: Secondary | ICD-10-CM | POA: Diagnosis not present

## 2020-11-14 DIAGNOSIS — N200 Calculus of kidney: Secondary | ICD-10-CM | POA: Diagnosis not present

## 2020-11-21 DIAGNOSIS — Z4589 Encounter for adjustment and management of other implanted devices: Secondary | ICD-10-CM | POA: Diagnosis not present

## 2020-11-21 DIAGNOSIS — N200 Calculus of kidney: Secondary | ICD-10-CM | POA: Diagnosis not present

## 2020-11-26 NOTE — Patient Instructions (Signed)
Dear Regina Chapman,  Below is a summary of the goals we discussed during our follow up appointment on November 08, 2020. Please contact me anytime with questions or concerns.   Visit Information   Patient Care Plan: CCM Pharmacy Care Plan     Problem Identified: Disease Management   Priority: High  Onset Date: 11/08/2020  Note:   Current Barriers:  None identified  Pharmacist Clinical Goal(s):  Patient will contact provider office for questions/concerns as evidenced notation of same in electronic health record through collaboration with PharmD and provider.   Interventions: 1:1 collaboration with [name], MD regarding development and update of comprehensive plan of care as evidenced by provider attestation and co-signature Inter-disciplinary care team collaboration (see longitudinal plan of care) Comprehensive medication review performed; medication list updated in electronic medical record  Hypertension  (BP goal <140/90) -Controlled - per home BP readings -Current treatment: HCTZ 25 mg - 1 tablet daily Losartan 100 mg - 1 tablet daily -Medications previously tried: none   -Current home readings: checks once daily - today 126/68, usually 135-140/67-70 -Current dietary habits: she has increased vegetable intake -Current exercise habits: none formal, stays active  -Denies hypotensive/hypertensive symptoms. Denies falls or imbalance. -Educated on BP goals and benefits of medications for prevention of heart attack, stroke and kidney damage; -Counseled to monitor BP at home weekly, document, and provide log at future appointments -Recommended to continue current medication  Depression/Anxiety (Goal: Improve mood) -Controlled - per patient report -Current treatment: Bupropion 200 mg 12 hr - 1 tablet twice daily Trazodone 50 mg - 1-2 tablets at bedtime as needed  -Medications previously tried/failed: none reported -PHQ9: none since 2019, patient reports she is doing well  on current therapy  -GAD7: none -Recommended to continue current medication  Patient Goals/Self-Care Activities Patient will:  - contact CCM team with any concerns  Follow Up Plan: Telephone follow up appointment with care management team member scheduled for:  12 months      Patient verbalizes understanding of instructions provided today and agrees to view in [place].   Debbora Dus, PharmD Clinical Pharmacist [facility] at [facility] [phone]

## 2020-11-28 DIAGNOSIS — N200 Calculus of kidney: Secondary | ICD-10-CM | POA: Diagnosis not present

## 2020-11-30 DIAGNOSIS — N200 Calculus of kidney: Secondary | ICD-10-CM | POA: Diagnosis not present

## 2020-12-12 ENCOUNTER — Other Ambulatory Visit: Payer: Self-pay

## 2020-12-12 ENCOUNTER — Encounter: Payer: Self-pay | Admitting: Internal Medicine

## 2020-12-12 ENCOUNTER — Ambulatory Visit (INDEPENDENT_AMBULATORY_CARE_PROVIDER_SITE_OTHER): Payer: Medicare Other | Admitting: Internal Medicine

## 2020-12-12 VITALS — BP 102/70 | HR 80 | Temp 97.4°F | Ht 61.0 in | Wt 183.0 lb

## 2020-12-12 DIAGNOSIS — C7B00 Secondary carcinoid tumors, unspecified site: Secondary | ICD-10-CM | POA: Diagnosis not present

## 2020-12-12 DIAGNOSIS — N200 Calculus of kidney: Secondary | ICD-10-CM | POA: Diagnosis not present

## 2020-12-12 DIAGNOSIS — Z7189 Other specified counseling: Secondary | ICD-10-CM

## 2020-12-12 DIAGNOSIS — I10 Essential (primary) hypertension: Secondary | ICD-10-CM

## 2020-12-12 DIAGNOSIS — F3341 Major depressive disorder, recurrent, in partial remission: Secondary | ICD-10-CM

## 2020-12-12 DIAGNOSIS — Z0000 Encounter for general adult medical examination without abnormal findings: Secondary | ICD-10-CM

## 2020-12-12 NOTE — Patient Instructions (Signed)
https://www.nhlbi.nih.gov/files/docs/public/heart/dash_brief.pdf">  DASH Eating Plan DASH stands for Dietary Approaches to Stop Hypertension. The DASH eating plan is a healthy eating plan that has been shown to: Reduce high blood pressure (hypertension). Reduce your risk for type 2 diabetes, heart disease, and stroke. Help with weight loss. What are tips for following this plan? Reading food labels Check food labels for the amount of salt (sodium) per serving. Choose foods with less than 5 percent of the Daily Value of sodium. Generally, foods with less than 300 milligrams (mg) of sodium per serving fit into this eating plan. To find whole grains, look for the word "whole" as the first word in the ingredient list. Shopping Buy products labeled as "low-sodium" or "no salt added." Buy fresh foods. Avoid canned foods and pre-made or frozen meals. Cooking Avoid adding salt when cooking. Use salt-free seasonings or herbs instead of table salt or sea salt. Check with your health care provider or pharmacist before using salt substitutes. Do not fry foods. Cook foods using healthy methods such as baking, boiling, grilling, roasting, and broiling instead. Cook with heart-healthy oils, such as olive, canola, avocado, soybean, or sunflower oil. Meal planning  Eat a balanced diet that includes: 4 or more servings of fruits and 4 or more servings of vegetables each day. Try to fill one-half of your plate with fruits and vegetables. 6-8 servings of whole grains each day. Less than 6 oz (170 g) of lean meat, poultry, or fish each day. A 3-oz (85-g) serving of meat is about the same size as a deck of cards. One egg equals 1 oz (28 g). 2-3 servings of low-fat dairy each day. One serving is 1 cup (237 mL). 1 serving of nuts, seeds, or beans 5 times each week. 2-3 servings of heart-healthy fats. Healthy fats called omega-3 fatty acids are found in foods such as walnuts, flaxseeds, fortified milks, and eggs.  These fats are also found in cold-water fish, such as sardines, salmon, and mackerel. Limit how much you eat of: Canned or prepackaged foods. Food that is high in trans fat, such as some fried foods. Food that is high in saturated fat, such as fatty meat. Desserts and other sweets, sugary drinks, and other foods with added sugar. Full-fat dairy products. Do not salt foods before eating. Do not eat more than 4 egg yolks a week. Try to eat at least 2 vegetarian meals a week. Eat more home-cooked food and less restaurant, buffet, and fast food.  Lifestyle When eating at a restaurant, ask that your food be prepared with less salt or no salt, if possible. If you drink alcohol: Limit how much you use to: 0-1 drink a day for women who are not pregnant. 0-2 drinks a day for men. Be aware of how much alcohol is in your drink. In the [place] one drink equals one 12 oz bottle of beer (355 mL), one 5 oz glass of wine (148 mL), or one 1 oz glass of hard liquor (44 mL). General information Avoid eating more than 2,300 mg of salt a day. If you have hypertension, you may need to reduce your sodium intake to 1,500 mg a day. Work with your health care provider to maintain a healthy body weight or to lose weight. Ask what an ideal weight is for you. Get at least 30 minutes of exercise that causes your heart to beat faster (aerobic exercise) most days of the week. Activities may include walking, swimming, or biking. Work with your health care provider   or dietitian to adjust your eating plan to your individual calorie needs. What foods should I eat? Fruits All fresh, dried, or frozen fruit. Canned fruit in natural juice (without addedsugar). Vegetables Fresh or frozen vegetables (raw, steamed, roasted, or grilled). Low-sodium or reduced-sodium tomato and vegetable juice. Low-sodium or reduced-sodium tomatosauce and tomato paste. Low-sodium or reduced-sodium canned vegetables. Grains Whole-grain or  whole-wheat bread. Whole-grain or whole-wheat pasta. Brown rice. Oatmeal. Quinoa. Bulgur. Whole-grain and low-sodium cereals. Pita bread.Low-fat, low-sodium crackers. Whole-wheat flour tortillas. Meats and other proteins Skinless chicken or turkey. Ground chicken or turkey. Pork with fat trimmed off. Fish and seafood. Egg whites. Dried beans, peas, or lentils. Unsalted nuts, nut butters, and seeds. Unsalted canned beans. Lean cuts of beef with fat trimmed off. Low-sodium, lean precooked or cured meat, such as sausages or meatloaves. Dairy Low-fat (1%) or fat-free (skim) milk. Reduced-fat, low-fat, or fat-free cheeses. Nonfat, low-sodium ricotta or cottage cheese. Low-fat or nonfatyogurt. Low-fat, low-sodium cheese. Fats and oils Soft margarine without trans fats. Vegetable oil. Reduced-fat, low-fat, or light mayonnaise and salad dressings (reduced-sodium). Canola, safflower, olive, avocado, soybean, andsunflower oils. Avocado. Seasonings and condiments Herbs. Spices. Seasoning mixes without salt. Other foods Unsalted popcorn and pretzels. Fat-free sweets. The items listed above may not be a complete list of foods and beverages you can eat. Contact a dietitian for more information. What foods should I avoid? Fruits Canned fruit in a light or heavy syrup. Fried fruit. Fruit in cream or buttersauce. Vegetables Creamed or fried vegetables. Vegetables in a cheese sauce. Regular canned vegetables (not low-sodium or reduced-sodium). Regular canned tomato sauce and paste (not low-sodium or reduced-sodium). Regular tomato and vegetable juice(not low-sodium or reduced-sodium). Pickles. Olives. Grains Baked goods made with fat, such as croissants, muffins, or some breads. Drypasta or rice meal packs. Meats and other proteins Fatty cuts of meat. Ribs. Fried meat. Bacon. Bologna, salami, and other precooked or cured meats, such as sausages or meat loaves. Fat from the back of a pig (fatback). Bratwurst.  Salted nuts and seeds. Canned beans with added salt. Canned orsmoked fish. Whole eggs or egg yolks. Chicken or turkey with skin. Dairy Whole or 2% milk, cream, and half-and-half. Whole or full-fat cream cheese. Whole-fat or sweetened yogurt. Full-fat cheese. Nondairy creamers. Whippedtoppings. Processed cheese and cheese spreads. Fats and oils Butter. Stick margarine. Lard. Shortening. Ghee. Bacon fat. Tropical oils, suchas coconut, palm kernel, or palm oil. Seasonings and condiments Onion salt, garlic salt, seasoned salt, table salt, and sea salt. Worcestershire sauce. Tartar sauce. Barbecue sauce. Teriyaki sauce. Soy sauce, including reduced-sodium. Steak sauce. Canned and packaged gravies. Fish sauce. Oyster sauce. Cocktail sauce. Store-bought horseradish. Ketchup. Mustard. Meat flavorings and tenderizers. Bouillon cubes. Hot sauces. Pre-made or packaged marinades. Pre-made or packaged taco seasonings. Relishes. Regular saladdressings. Other foods Salted popcorn and pretzels. The items listed above may not be a complete list of foods and beverages you should avoid. Contact a dietitian for more information. Where to find more information National Heart, Lung, and Blood Institute: www.nhlbi.nih.gov American Heart Association: www.heart.org Academy of Nutrition and Dietetics: www.eatright.org National Kidney Foundation: www.kidney.org Summary The DASH eating plan is a healthy eating plan that has been shown to reduce high blood pressure (hypertension). It may also reduce your risk for type 2 diabetes, heart disease, and stroke. When on the DASH eating plan, aim to eat more fresh fruits and vegetables, whole grains, lean proteins, low-fat dairy, and heart-healthy fats. With the DASH eating plan, you should limit salt (sodium) intake to 2,300   mg a day. If you have hypertension, you may need to reduce your sodium intake to 1,500 mg a day. Work with your health care provider or dietitian to adjust  your eating plan to your individual calorie needs. This information is not intended to replace advice given to you by your health care provider. Make sure you discuss any questions you have with your healthcare provider. Document Revised: 03/18/2019 Document Reviewed: 03/18/2019 Elsevier Patient Education  2022 Elsevier Inc.  

## 2020-12-12 NOTE — Assessment & Plan Note (Signed)
Doing fairly well on bupropion and trazodone for sleep

## 2020-12-12 NOTE — Assessment & Plan Note (Addendum)
I have personally reviewed the Medicare Annual Wellness questionnaire and have noted 1. The patient's medical and social history 2. Their use of alcohol, tobacco or illicit drugs 3. Their current medications and supplements 4. The patient's functional ability including ADL's, fall risks, home safety risks and hearing or visual             impairment. 5. Diet and physical activities 6. Evidence for depression or mood disorders  The patients weight, height, BMI and visual acuity have been recorded in the chart I have made referrals, counseling and provided education to the patient based review of the above and I have provided the pt with a written personalized care plan for preventive services.  I have provided you with a copy of your personalized plan for preventive services. Please take the time to review along with your updated medication list.  Mammogram due again by 9/23 Colon due now or may be extended 2 years---will check with Dr Loletha Carrow Discussed exercise Bivalent COVID and flu vaccines soon shingrix when covered

## 2020-12-12 NOTE — Progress Notes (Signed)
Subjective:    Patient ID: Regina Chapman, female    DOB: 05/17/1950, 70 y.o.   MRN: [id number]  HPI Here for Medicare wellness visit and follow up of chronic health conditions This visit occurred during the SARS-CoV-2 public health emergency.  Safety protocols were in place, including screening questions prior to the visit, additional usage of staff PPE, and extensive cleaning of exam room while observing appropriate contact time as indicated for disinfecting solutions.   Reviewed form and advanced directives Reviewed other doctors Occasional alcohol No tobacco Tries to walk occasionally---discussed Vision and hearing are fine No falls Independent with instrumental ADLs No sig memory problems  The carcinoid has not progressed lately No treatment for now--going back soon  Depression is controlled Only occasional sadness--but nothing persistent No anhedonia  Recent recurrence of ureteral stones Removed with cystoscopy, etc Still has one there  No chest pain or SOB No dizziness or syncope No edema No palpitations--or very rare fluttering  Current Outpatient Medications on File Prior to Visit  Medication Sig Dispense Refill   Biotin 10000 MCG TABS Take 1 tablet by mouth daily.     buPROPion (WELLBUTRIN SR) 200 MG 12 hr tablet TAKE ONE TABLET BY MOUTH TWICE DAILY 180 tablet 1   cholecalciferol (VITAMIN D) 1000 units tablet Take 1,000 Units by mouth daily.     hydrochlorothiazide (HYDRODIURIL) 25 MG tablet TAKE ONE TABLET BY MOUTH ONCE DAILY 90 tablet 1   losartan (COZAAR) 100 MG tablet TAKE ONE TABLET BY MOUTH ONCE DAILY 90 tablet 3   Omega 3 1000 MG CAPS Take 2 capsules by mouth daily.     potassium citrate (UROCIT-K) 10 MEQ (1080 MG) SR tablet Take 20 mEq by mouth 2 (two) times daily.     traMADol (ULTRAM) 50 MG tablet Take 50 mg by mouth 2 (two) times daily as needed.     traZODone (DESYREL) 50 MG tablet TAKE 1 TO 2 TABLETS(50 TO 100 MG) BY MOUTH AT BEDTIME AS NEEDED  180 tablet 3   No current facility-administered medications on file prior to visit.    Allergies  Allergen Reactions   Augmentin [Amoxicillin-Pot Clavulanate] Nausea And Vomiting    Able to tolerate Amoxil   Cortisone Other (See Comments)    Pt had depomedrol inj and next day had red face,puffiness in face and slight swelling in face; no SOB or swelling in mouth,tongue or throat.    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   Depression    History of melanoma 1985   Hyperlipidemia    Hypertension    Kidney stones    Obesity    Obesity    Psoriasis     Past Surgical History:  Procedure Laterality Date   ABDOMINAL HYSTERECTOMY     CATARACT EXTRACTION W/ INTRAOCULAR LENS  IMPLANT, BILATERAL  2013   COLOSTOMY  2007   LAPAROSCOPIC ASSISTED VAGINAL HYSTERECTOMY  1987   FOR ENDOMETRIOSIS   LUNG REMOVAL, PARTIAL     MELANOMA EXCISION  1985   OOPHORECTOMY      Family History  Problem Relation Age of Onset   Cervical cancer Sister        CERVICAL    Heart disease Mother    Dementia Mother    Breast cancer Other    Diabetes Neg Hx    Hypertension Neg Hx    Colon cancer Neg Hx  Social History   Socioeconomic History   Marital status: Widowed    Spouse name: Not on file   Number of children: 1   Years of education: Not on file   Highest education level: Not on file  Occupational History   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  Tobacco Use   Smoking status: Never   Smokeless tobacco: Never  Substance and Sexual Activity   Alcohol use: Yes    Alcohol/week: 3.0 standard drinks    Types: 3 Glasses of wine per week    Comment: 3 PER WEEK   Drug use: No   Sexual activity: Never    Birth control/protection: Surgical  Other Topics Concern   Not on file  Social History Narrative   No living will or health care POA   Would want son to make decisions   Would accept  resuscitation but no prolonged ventilation   No tube feeds if cognitively unaware   Social Determinants of Health   Financial Resource Strain: Low Risk    Difficulty of Paying Living Expenses: Not very hard  Food Insecurity: Not on file  Transportation Needs: Not on file  Physical Activity: Not on file  Stress: Not on file  Social Connections: Not on file  Intimate Partner Violence: Not on file   Review of Systems Appetite is fine Weight up slightly Sleeps okay ---with the trazodone Wears seat belt Teeth are okay---keeps up with dentist No suspicious skin lesions--no recent derm visit No heartburn or dysphagia Bowels move okay with fiber.  Some bladder spasm since kidney stone removal Back has been helped by acupuncture --last ESI not very helpful     Objective:   Physical Exam Constitutional:      Appearance: Normal appearance.  HENT:     Mouth/Throat:     Comments: No lesions Eyes:     Conjunctiva/sclera: Conjunctivae normal.     Pupils: Pupils are equal, round, and reactive to light.  Cardiovascular:     Rate and Rhythm: Normal rate and regular rhythm.     Pulses: Normal pulses.     Heart sounds: No murmur heard.   No gallop.  Pulmonary:     Effort: Pulmonary effort is normal.     Breath sounds: Normal breath sounds. No wheezing or rales.  Abdominal:     Palpations: Abdomen is soft.     Tenderness: There is no abdominal tenderness.  Musculoskeletal:     Cervical back: Neck supple.  Lymphadenopathy:     Cervical: No cervical adenopathy.  Skin:    General: Skin is warm.     Findings: No rash.  Neurological:     Mental Status: She is alert and oriented to person, place, and time.     Comments: President---"Biden, Trump, Obama" [id number] [id number] Recall 3/3  Psychiatric:        Mood and Affect: Mood normal.        Behavior: Behavior normal.           Assessment & Plan:

## 2020-12-12 NOTE — Assessment & Plan Note (Signed)
See social history 

## 2020-12-12 NOTE — Assessment & Plan Note (Signed)
Recent procedure Had been on potassium citrate--but couldn't tolerate  Using OTC citrate now

## 2020-12-12 NOTE — Assessment & Plan Note (Signed)
BP Readings from Last 3 Encounters:  12/12/20 102/70  09/17/20 (!) 145/69  05/29/20 (!) 145/63   Good control now on the losartan and HCTZ Recent labs fine

## 2020-12-12 NOTE — Progress Notes (Signed)
Hearing Screening  Method: Audiometry   500Hz  1000Hz  2000Hz  4000Hz   Right ear 20 20 20 20   Left ear 20 20 20 20   Vision Screening - Comments:: December 2021

## 2020-12-12 NOTE — Assessment & Plan Note (Signed)
Going back soon to decide about further Rx (whether needs to be restarted)

## 2021-01-07 DIAGNOSIS — M47815 Spondylosis without myelopathy or radiculopathy, thoracolumbar region: Secondary | ICD-10-CM | POA: Diagnosis not present

## 2021-01-07 DIAGNOSIS — N202 Calculus of kidney with calculus of ureter: Secondary | ICD-10-CM | POA: Diagnosis not present

## 2021-01-07 DIAGNOSIS — J984 Other disorders of lung: Secondary | ICD-10-CM | POA: Diagnosis not present

## 2021-01-07 DIAGNOSIS — R918 Other nonspecific abnormal finding of lung field: Secondary | ICD-10-CM | POA: Diagnosis not present

## 2021-01-07 DIAGNOSIS — C7A8 Other malignant neuroendocrine tumors: Secondary | ICD-10-CM | POA: Diagnosis not present

## 2021-01-07 DIAGNOSIS — C313 Malignant neoplasm of sphenoid sinus: Secondary | ICD-10-CM | POA: Diagnosis not present

## 2021-01-07 DIAGNOSIS — C7B8 Other secondary neuroendocrine tumors: Secondary | ICD-10-CM | POA: Diagnosis not present

## 2021-01-07 DIAGNOSIS — C7A010 Malignant carcinoid tumor of the duodenum: Secondary | ICD-10-CM | POA: Diagnosis not present

## 2021-01-07 DIAGNOSIS — D3A8 Other benign neuroendocrine tumors: Secondary | ICD-10-CM | POA: Diagnosis not present

## 2021-01-07 DIAGNOSIS — N1330 Unspecified hydronephrosis: Secondary | ICD-10-CM | POA: Diagnosis not present

## 2021-01-22 DIAGNOSIS — N200 Calculus of kidney: Secondary | ICD-10-CM | POA: Diagnosis not present

## 2021-01-22 DIAGNOSIS — R82991 Hypocitraturia: Secondary | ICD-10-CM | POA: Diagnosis not present

## 2021-01-22 DIAGNOSIS — R918 Other nonspecific abnormal finding of lung field: Secondary | ICD-10-CM | POA: Diagnosis not present

## 2021-01-22 DIAGNOSIS — R109 Unspecified abdominal pain: Secondary | ICD-10-CM | POA: Diagnosis not present

## 2021-01-22 DIAGNOSIS — N202 Calculus of kidney with calculus of ureter: Secondary | ICD-10-CM | POA: Diagnosis not present

## 2021-02-07 ENCOUNTER — Other Ambulatory Visit: Payer: Self-pay | Admitting: Internal Medicine

## 2021-02-11 ENCOUNTER — Telehealth: Payer: Self-pay

## 2021-02-11 NOTE — Chronic Care Management (AMB) (Addendum)
Chronic Care Management Pharmacy Assistant   Name: Regina Chapman  MRN: [id number] DOB: 10/10/1950   Reason for Encounter: Medication Adherence and Delivery Coordination   Recent office visits:  12/12/20-PCP-Patient presented for AWV.Discussed exercise, get vaccines soon,continue OTC citrate,no medication changes follow up 1 year.  Recent consult visits:  01/22/21-[facility]-Patient presented for follow up nephrolithiasis.Start Moonstone supplement. Take twice daily per package instructions.UA shows hypocitraturia,order litholink stone kit. 11/21/20-[facility]-Patient presented for post op care.Stent removal.no new medications 11/14/20-[facility]-Patient presented for kidney stone removal-Ureteroscopy- may use the following for pain and symptoms. Diclofenac: Prescription strength anti-inflammatory which has been shown to provide superior pain control for kidney pain compared to narcotics. Do not take ibuprofen or naproxen while taking this. Use as directed on bottle. Tamsulosin: Prescription to decrease ureter spasm. Take every night at bedtime. Start DAY AFTER surgery Oxybutynin: Prescription to decrease bladder spasms. Take three times a day if needed for bladder spasms.  Phenazopyridine: Prescription for painful urination (turns urine orange). Take three times a day if needed. Acetaminophen (Tylenol): Over the counter, general pain reliever. Take every 4-6 hours as directed on bottle Polyethylene glycol (Miralax): Over the counter, for prevention of constipation. Take once per day until no longer taking narcotics   Hospital visits:  11/14/20-[facility]- no admission  Medications: Outpatient Encounter Medications as of 02/11/2021  Medication Sig   Biotin 10000 MCG TABS Take 1 tablet by mouth daily.   buPROPion (WELLBUTRIN SR) 200 MG 12 hr tablet TAKE ONE TABLET BY MOUTH TWICE DAILY   cholecalciferol (VITAMIN D) 1000 units  tablet Take 1,000 Units by mouth daily.   hydrochlorothiazide (HYDRODIURIL) 25 MG tablet TAKE ONE TABLET BY MOUTH ONCE DAILY   losartan (COZAAR) 100 MG tablet TAKE ONE TABLET BY MOUTH ONCE DAILY   Omega 3 1000 MG CAPS Take 2 capsules by mouth daily.   potassium citrate (UROCIT-K) 10 MEQ (1080 MG) SR tablet Take 20 mEq by mouth 2 (two) times daily.   traMADol (ULTRAM) 50 MG tablet Take 50 mg by mouth 2 (two) times daily as needed.   traZODone (DESYREL) 50 MG tablet TAKE 1 TO 2 TABLETS(50 TO 100 MG) BY MOUTH AT BEDTIME AS NEEDED   No facility-administered encounter medications on file as of 02/11/2021.    BP Readings from Last 3 Encounters:  12/12/20 102/70  09/17/20 (!) 145/69  05/29/20 (!) 145/63    No results found for: HGBA1C    Last adherence delivery date:11/22/20      Patient is due for next adherence delivery on: 02/21/21  Multiple attempts made to reach patient. Unsuccessful outreach. Will refill based off of last adherence fill.   This delivery to include: Vials  90 Days VIAL medications: Hydrochlorothiazide 25 mg tablet-Take one tablet by mouth daily Bupropion 200 mg 12 HR Tablet- Take one tablet by mouth two times daily Losartan potassium 100 mg - Take one tablet by mouth once daily Trazodone 50 mg - Take one-two tablets at bedtime PRN   Patient declined the following medications this month: Unable to speak to patient  Refills requested from providers include: Bupropion 247m  Delivery scheduled for 02/21/21. Unable to speak with patient to confirm date.   Annual wellness visit in last year? Yes Most Recent BP reading: 102/70  80-P  12/12/20  MDebbora Dus CPP notified  [place] [phone] I have reviewed the care management and care coordination activities outlined in this encounter  and I am certifying that I agree with the content of this note. No further action required.  Debbora Dus, PharmD Clinical  Pharmacist [facility] at [facility] [phone]

## 2021-02-20 DIAGNOSIS — M48062 Spinal stenosis, lumbar region with neurogenic claudication: Secondary | ICD-10-CM | POA: Diagnosis not present

## 2021-02-20 DIAGNOSIS — M5136 Other intervertebral disc degeneration, lumbar region: Secondary | ICD-10-CM | POA: Diagnosis not present

## 2021-02-20 DIAGNOSIS — Z79899 Other long term (current) drug therapy: Secondary | ICD-10-CM | POA: Diagnosis not present

## 2021-02-20 DIAGNOSIS — M5416 Radiculopathy, lumbar region: Secondary | ICD-10-CM | POA: Diagnosis not present

## 2021-02-21 DIAGNOSIS — Z23 Encounter for immunization: Secondary | ICD-10-CM | POA: Diagnosis not present

## 2021-03-08 ENCOUNTER — Other Ambulatory Visit: Payer: Self-pay | Admitting: Internal Medicine

## 2021-03-08 DIAGNOSIS — Z1231 Encounter for screening mammogram for malignant neoplasm of breast: Secondary | ICD-10-CM

## 2021-03-28 ENCOUNTER — Encounter: Payer: Self-pay | Admitting: Internal Medicine

## 2021-04-01 ENCOUNTER — Other Ambulatory Visit: Payer: Self-pay

## 2021-04-01 ENCOUNTER — Ambulatory Visit (INDEPENDENT_AMBULATORY_CARE_PROVIDER_SITE_OTHER): Payer: Medicare Other | Admitting: Family

## 2021-04-01 ENCOUNTER — Encounter: Payer: Self-pay | Admitting: Family

## 2021-04-01 VITALS — BP 136/82 | HR 75 | Temp 97.7°F | Ht 61.0 in | Wt 187.0 lb

## 2021-04-01 DIAGNOSIS — L304 Erythema intertrigo: Secondary | ICD-10-CM | POA: Insufficient documentation

## 2021-04-01 MED ORDER — BUTENAFINE HCL 1 % EX CREA: TOPICAL_CREAM | CUTANEOUS | 1 refills | Status: DC

## 2021-04-01 NOTE — Assessment & Plan Note (Signed)
Antifungal cream sent to pharmacy patient advised on how to properly treat intertrigo.  Handout given to patient as well for proper care.  Patient to advise me if in the next 48 to 72 hours there is no improvement.  May consider combination cream with steroid if no improvement with just fungal.  Patient does state that she tolerates and has used hydrocortisone over-the-counter cream in the last year or so.  Only had an allergy to the intramuscular injection of Depo-Medrol.

## 2021-04-01 NOTE — Progress Notes (Signed)
Established Patient Office Visit  Subjective:  Patient ID: BEAU VANDUZER, female    DOB: Apr 01, 1951  Age: 70 y.o. MRN: [id number]  CC:  Chief Complaint  Patient presents with   Rash    Under stomach     HPI Regina Chapman is here today with c/o rash on her groin area and lower abdomin. She also states the rash is under her left breast. Has been trying to treat at home over the last one month with otc antbx ointment, neosporin with no relief of symptoms.   No fever and or chills.  Rash is itchy at times, and irritating. Sore at times.  Pt doesn't think she sweats a lot during the day.   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   Depression    History of melanoma 1985   Hyperlipidemia    Hypertension    Kidney stones    Obesity    Obesity    Psoriasis     Past Surgical History:  Procedure Laterality Date   ABDOMINAL HYSTERECTOMY     CATARACT EXTRACTION W/ INTRAOCULAR LENS  IMPLANT, BILATERAL  2013   COLOSTOMY  2007   LAPAROSCOPIC ASSISTED VAGINAL HYSTERECTOMY  1987   FOR ENDOMETRIOSIS   LUNG REMOVAL, PARTIAL     MELANOMA EXCISION  1985   OOPHORECTOMY      Family History  Problem Relation Age of Onset   Cervical cancer Sister        CERVICAL    Heart disease Mother    Dementia Mother    Breast cancer Other    Diabetes Neg Hx    Hypertension Neg Hx    Colon cancer Neg Hx     Social History   Socioeconomic History   Marital status: Widowed    Spouse name: Not on file   Number of children: 1   Years of education: Not on file   Highest education level: Not on file  Occupational History   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  Tobacco Use   Smoking status: Never   Smokeless tobacco: Never  Substance and Sexual Activity   Alcohol use: Yes    Alcohol/week: 3.0 standard drinks    Types: 3 Glasses of wine per week    Comment: 3 PER WEEK   Drug use: No    Sexual activity: Never    Birth control/protection: Surgical  Other Topics Concern   Not on file  Social History Narrative   No living will or health care POA   Would want son to make decisions   Would accept resuscitation but no prolonged ventilation   No tube feeds if cognitively unaware   Social Determinants of Health   Financial Resource Strain: Low Risk    Difficulty of Paying Living Expenses: Not very hard  Food Insecurity: Not on file  Transportation Needs: Not on file  Physical Activity: Not on file  Stress: Not on file  Social Connections: Not on file  Intimate Partner Violence: Not on file    Outpatient Medications Prior to Visit  Medication Sig Dispense Refill   Biotin 10000 MCG TABS Take 1 tablet by mouth daily.     buPROPion (WELLBUTRIN SR) 200 MG 12 hr tablet TAKE ONE TABLET BY MOUTH TWICE DAILY 180 tablet 3   cholecalciferol (VITAMIN D) 1000 units tablet Take 1,000 Units by mouth daily.  gabapentin (NEURONTIN) 100 MG capsule Take 100 mg by mouth at bedtime.     hydrochlorothiazide (HYDRODIURIL) 25 MG tablet TAKE ONE TABLET BY MOUTH ONCE DAILY 90 tablet 3   losartan (COZAAR) 100 MG tablet TAKE ONE TABLET BY MOUTH ONCE DAILY 90 tablet 3   Omega 3 1000 MG CAPS Take 2 capsules by mouth daily.     potassium citrate (UROCIT-K) 10 MEQ (1080 MG) SR tablet Take 20 mEq by mouth 2 (two) times daily.     traMADol (ULTRAM) 50 MG tablet Take 50 mg by mouth 2 (two) times daily as needed.     traZODone (DESYREL) 50 MG tablet TAKE 1 TO 2 TABLETS(50 TO 100 MG) BY MOUTH AT BEDTIME AS NEEDED 180 tablet 3   No facility-administered medications prior to visit.    Allergies  Allergen Reactions   Augmentin [Amoxicillin-Pot Clavulanate] Nausea And Vomiting    Able to tolerate Amoxil   Cortisone Other (See Comments)    Pt had depomedrol inj and next day had red face,puffiness in face and slight swelling in face; no SOB or swelling in mouth,tongue or throat.     Review of  Systems  Constitutional:  Negative for chills and fever.  Respiratory:  Negative for cough, shortness of breath and wheezing.   Cardiovascular:  Negative for chest pain and palpitations.  Genitourinary:  Negative for difficulty urinating.  Skin:  Positive for rash (itchy rash under left breast and also on bil inner groins as well as below her abdominal fold).     Objective:    Physical Exam Constitutional:      Appearance: Normal appearance. She is obese.  Skin:    Findings: Rash (papular lesions with few sattelite lesions, scant under left breast, more prominent bil inguinal folds as well as lower abdominal fold) present.  Neurological:     General: No focal deficit present.     Mental Status: She is alert and oriented to person, place, and time.       BP 136/82   Pulse 75   Temp 97.7 F (36.5 C) (Temporal)   Ht 5\' 1"  (1.549 m)   Wt 187 lb (84.8 kg)   SpO2 94%   BMI 35.33 kg/m  Wt Readings from Last 3 Encounters:  04/01/21 187 lb (84.8 kg)  12/12/20 183 lb (83 kg)  12/09/19 176 lb (79.8 kg)     Health Maintenance Due  Topic Date Due   Hepatitis C Screening  Never done   Zoster Vaccines- Shingrix (1 of 2) Never done   COVID-19 Vaccine (4 - Booster for Pfizer series) 09/01/2020   INFLUENZA VACCINE  11/26/2020   COLONOSCOPY (Pts 45-63yrs Insurance coverage will need to be confirmed)  12/25/2020    There are no preventive care reminders to display for this patient.  Lab Results  Component Value Date   TSH 1.21 02/09/2013   Lab Results  Component Value Date   WBC 9.8 06/22/2017   HGB 14.8 06/22/2017   HCT 43.2 06/22/2017   MCV 89.7 06/22/2017   PLT 290.0 06/22/2017   Lab Results  Component Value Date   NA 138 07/07/2017   K 4.1 07/07/2017   CO2 32 07/07/2017   GLUCOSE 97 07/07/2017   BUN 16 07/07/2017   CREATININE 0.77 07/07/2017   BILITOT 0.5 06/22/2017   ALKPHOS 58 06/22/2017   AST 19 06/22/2017   ALT 24 06/22/2017   PROT 7.9 06/22/2017    ALBUMIN 4.2 07/07/2017   CALCIUM 10.4 07/07/2017  GFR 79.38 07/07/2017   Lab Results  Component Value Date   CHOL 187 04/04/2015   Lab Results  Component Value Date   HDL 34.90 (L) 04/04/2015   Lab Results  Component Value Date   LDLCALC 131 (H) 04/04/2015   Lab Results  Component Value Date   TRIG 106.0 04/04/2015   Lab Results  Component Value Date   CHOLHDL 5 04/04/2015   No results found for: HGBA1C    Assessment & Plan:   Problem List Items Addressed This Visit       Musculoskeletal and Integument   Intertrigo - Primary    Antifungal cream sent to pharmacy patient advised on how to properly treat intertrigo.  Handout given to patient as well for proper care.  Patient to advise me if in the next 48 to 72 hours there is no improvement.  May consider combination cream with steroid if no improvement with just fungal.  Patient does state that she tolerates and has used hydrocortisone over-the-counter cream in the last year or so.  Only had an allergy to the intramuscular injection of Depo-Medrol.      Relevant Medications   Butenafine HCl 1 % cream    Meds ordered this encounter  Medications   Butenafine HCl 1 % cream    Sig: Apply one application to rash two times daily for up to two weeks    Dispense:  24 g    Refill:  1    Order Specific Question:   Supervising Provider    Answer:   Diona Browner, AMY E [[id number]]    Follow-up: Return in about 2 weeks (around 04/15/2021), or if no improvement in rash symptoms.    Eugenia Pancoast, FNP

## 2021-04-01 NOTE — Patient Instructions (Signed)
I have prescribed an antibiotic cream treatment if persists I prescribed an antifungal cream for you to typically find over-the-counter as well.  Please start and take for up to 2 weeks if mild improvement of the rash after 2 weeks but not acutely can do up to 4 weeks.  If symptoms persist and/or do not improve we will refer to dermatology.  If no improvement in the next 48 to 72 hours please let me know.  It was a pleasure seeing you today! Please do not hesitate to reach out with any questions and or concerns.  Regards,   Eugenia Pancoast

## 2021-04-05 ENCOUNTER — Encounter: Payer: Self-pay | Admitting: Family

## 2021-04-05 DIAGNOSIS — L304 Erythema intertrigo: Secondary | ICD-10-CM

## 2021-04-09 MED ORDER — CLOTRIMAZOLE-BETAMETHASONE 1-0.05 % EX CREA: TOPICAL_CREAM | CUTANEOUS | 0 refills | Status: DC

## 2021-04-09 NOTE — Telephone Encounter (Signed)
Pt called  in to follow up on message wants to know will something be call in to the pharmacy . Please advise #[phone]

## 2021-04-18 ENCOUNTER — Encounter: Payer: Self-pay | Admitting: Gastroenterology

## 2021-05-09 ENCOUNTER — Telehealth: Payer: Self-pay

## 2021-05-09 NOTE — Progress Notes (Addendum)
Chronic Care Management Pharmacy Assistant   Name: Regina Chapman  MRN: [id number] DOB: 04/01/1951  Reason for Encounter: CCM (Medication Adherence and Delivery Coordination)   Recent office visits:  None since last CCM contact  Recent consult visits:  04/05/2021 - Family Medicine - Patient Message - Start: clotrimazole-betamethasone (LOTRISONE) cream due to new rash areas.  04/01/2021 - Family Medicine - Patient presented for rash. Start: Butenafine HCl 1 % cream due to Intertrigo. 02/20/2021 - Physical Medicine and Rehabilitation - Patient presented for follow-up of acute on chronic bilateral low back pain with radiating pain to the left posterior buttock and posterior thigh stopping at the knee. No medication changes.   Hospital visits:  None in previous 6 months  Medications: Outpatient Encounter Medications as of 05/09/2021  Medication Sig   Biotin 10000 MCG TABS Take 1 tablet by mouth daily.   buPROPion (WELLBUTRIN SR) 200 MG 12 hr tablet TAKE ONE TABLET BY MOUTH TWICE DAILY   Butenafine HCl 1 % cream Apply one application to rash two times daily for up to two weeks   cholecalciferol (VITAMIN D) 1000 units tablet Take 1,000 Units by mouth daily.   clotrimazole-betamethasone (LOTRISONE) cream Apply 1 application topically twice daily up to two weeks   gabapentin (NEURONTIN) 100 MG capsule Take 100 mg by mouth at bedtime.   hydrochlorothiazide (HYDRODIURIL) 25 MG tablet TAKE ONE TABLET BY MOUTH ONCE DAILY   losartan (COZAAR) 100 MG tablet TAKE ONE TABLET BY MOUTH ONCE DAILY   Omega 3 1000 MG CAPS Take 2 capsules by mouth daily.   potassium citrate (UROCIT-K) 10 MEQ (1080 MG) SR tablet Take 20 mEq by mouth 2 (two) times daily.   traMADol (ULTRAM) 50 MG tablet Take 50 mg by mouth 2 (two) times daily as needed.   traZODone (DESYREL) 50 MG tablet TAKE 1 TO 2 TABLETS(50 TO 100 MG) BY MOUTH AT BEDTIME AS NEEDED   No facility-administered encounter medications on file as of  05/09/2021.   BP Readings from Last 3 Encounters:  04/01/21 136/82  12/12/20 102/70  09/17/20 (!) 145/69    No results found for: HGBA1C   Recent OV, Consult or Hospital visit:  No medication changes indicated only medication for rash in December.   Last adherence delivery date:02/21/2021      Patient is due for next adherence delivery on: 05/22/2021  Spoke with patient on 05/10/2021 reviewed medications and coordinated delivery.  This delivery to include: Vials  90 Days  Losartan potassium 100 mg - Take one tablet by mouth once daily Hydrochlorothiazide 25 mg tablet-Take one tablet by mouth daily Bupropion 200 mg 12 HR Tablet- Take one tablet by mouth two times daily Trazodone 50 mg - Take one-two tablets at bedtime PRN  Any concerns about your medications? No  How often do you forget or accidentally miss a dose? Rarely  Do you use a pillbox? No  Is patient in packaging No   No refill request needed.  Confirmed delivery date of 05/22/2021, advised patient that pharmacy will contact them the morning of delivery.   Recent blood pressure readings are as follows: Patient states she does not take it everyday, but it ranges 130-140 / 85. Patient was not at home to access her log.   Annual wellness visit in last year? Yes 12/12/2020 Most Recent BP reading: 136/82 on 04/01/2021  Debbora Dus, CPP notified  Marijean Niemann, [place] [facility] Assistant [phone]  Time Spent: 64 Minutes  I have reviewed the  care management and care coordination activities outlined in this encounter and I am certifying that I agree with the content of this note. No further action required.  Debbora Dus, PharmD Clinical Pharmacist [facility] [phone]

## 2021-05-15 ENCOUNTER — Other Ambulatory Visit: Payer: Self-pay | Admitting: Internal Medicine

## 2021-05-23 ENCOUNTER — Ambulatory Visit
Admission: RE | Admit: 2021-05-23 | Discharge: 2021-05-23 | Disposition: A | Discharge status: Home / Self Care | Payer: Medicare Other | Source: Ambulatory Visit | Attending: Internal Medicine | Admitting: Internal Medicine

## 2021-05-23 ENCOUNTER — Other Ambulatory Visit: Payer: Self-pay

## 2021-05-23 DIAGNOSIS — Z1231 Encounter for screening mammogram for malignant neoplasm of breast: Secondary | ICD-10-CM | POA: Insufficient documentation

## 2021-06-11 DIAGNOSIS — M48062 Spinal stenosis, lumbar region with neurogenic claudication: Secondary | ICD-10-CM | POA: Diagnosis not present

## 2021-06-11 DIAGNOSIS — M5416 Radiculopathy, lumbar region: Secondary | ICD-10-CM | POA: Diagnosis not present

## 2021-07-05 DIAGNOSIS — M5136 Other intervertebral disc degeneration, lumbar region: Secondary | ICD-10-CM | POA: Diagnosis not present

## 2021-07-05 DIAGNOSIS — M5416 Radiculopathy, lumbar region: Secondary | ICD-10-CM | POA: Diagnosis not present

## 2021-07-05 DIAGNOSIS — M48062 Spinal stenosis, lumbar region with neurogenic claudication: Secondary | ICD-10-CM | POA: Diagnosis not present

## 2021-07-08 DIAGNOSIS — Z23 Encounter for immunization: Secondary | ICD-10-CM | POA: Diagnosis not present

## 2021-07-08 DIAGNOSIS — C7B8 Other secondary neuroendocrine tumors: Secondary | ICD-10-CM | POA: Diagnosis not present

## 2021-07-08 DIAGNOSIS — C7951 Secondary malignant neoplasm of bone: Secondary | ICD-10-CM | POA: Diagnosis not present

## 2021-07-08 DIAGNOSIS — C801 Malignant (primary) neoplasm, unspecified: Secondary | ICD-10-CM | POA: Diagnosis not present

## 2021-07-08 DIAGNOSIS — J984 Other disorders of lung: Secondary | ICD-10-CM | POA: Diagnosis not present

## 2021-07-08 DIAGNOSIS — C7839 Secondary malignant neoplasm of other respiratory organs: Secondary | ICD-10-CM | POA: Diagnosis not present

## 2021-07-08 DIAGNOSIS — C7A8 Other malignant neuroendocrine tumors: Secondary | ICD-10-CM | POA: Diagnosis not present

## 2021-07-08 DIAGNOSIS — R918 Other nonspecific abnormal finding of lung field: Secondary | ICD-10-CM | POA: Diagnosis not present

## 2021-07-08 DIAGNOSIS — M8580 Other specified disorders of bone density and structure, unspecified site: Secondary | ICD-10-CM | POA: Diagnosis not present

## 2021-08-07 ENCOUNTER — Other Ambulatory Visit: Payer: Self-pay | Admitting: Internal Medicine

## 2021-08-08 ENCOUNTER — Telehealth: Payer: Self-pay

## 2021-08-08 NOTE — Progress Notes (Signed)
? ? ?  Chronic Care Management ?Pharmacy Assistant  ? ?Name: Regina Chapman  MRN: [id number] DOB: 12-Sep-1950 ? ?Reason for Encounter: CCM (Medication Adherence and Delivery Coordination) ?  ?Recent office visits:  ?None since last CCM contact ? 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 Continued observation with MRI of brain and CT of chest abdomen pelvis in 6 months. ?05/23/21 Mammogram ? ?Rehabilitation Visits: 2 visits ?[name] ?Degenerative Disc Disease; Lumbar Radiculitis; Lumbar Stenosis with Neurogenic Claudication ? ?Hospital visits:  ?None in previous 6 months ? ?Medications: ?Outpatient Encounter Medications as of 08/08/2021  ?Medication Sig  ? Biotin 10000 MCG TABS Take 1 tablet by mouth daily.  ? buPROPion (WELLBUTRIN SR) 200 MG 12 hr tablet TAKE ONE TABLET BY MOUTH TWICE DAILY  ? Butenafine HCl 1 % cream Apply one application to rash two times daily for up to two weeks  ? cholecalciferol (VITAMIN D) 1000 units tablet Take 1,000 Units by mouth daily.  ? clotrimazole-betamethasone (LOTRISONE) cream Apply 1 application topically twice daily up to two weeks  ? gabapentin (NEURONTIN) 100 MG capsule Take 100 mg by mouth at bedtime.  ? hydrochlorothiazide (HYDRODIURIL) 25 MG tablet TAKE ONE TABLET BY MOUTH ONCE DAILY  ? losartan (COZAAR) 100 MG tablet TAKE ONE TABLET BY MOUTH ONCE DAILY  ? Omega 3 1000 MG CAPS Take 2 capsules by mouth daily.  ? potassium citrate (UROCIT-K) 10 MEQ (1080 MG) SR tablet Take 20 mEq by mouth 2 (two) times daily.  ? traMADol (ULTRAM) 50 MG tablet Take 50 mg by mouth 2 (two) times daily as needed.  ? traZODone (DESYREL) 50 MG tablet TAKE 1 OR 2 TABLETS BY MOUTH EVERYDAY AT BEDTIME AS NEEDED  ? ?No facility-administered encounter medications on file as of 08/08/2021.  ? ?BP Readings from Last 3 Encounters:  ?04/01/21  136/82  ?12/12/20 102/70  ?09/17/20 (!) 145/69  ?  ?No results found for: HGBA1C  ? ?Recent OV, Consult or Hospital visit:  ?No medication changes indicated ? ?Last adherence delivery date: 05/22/2021     ? ?Patient is due for next adherence delivery on: 08/20/2021 ? ?Spoke with patient on 08/09/2021 reviewed medications and coordinated delivery. ? ?This delivery to include: Vials  90 Days  ? ?This delivery to include: Vials  90 Days  ?Losartan potassium 100 mg - Take one tablet by mouth once daily ?Hydrochlorothiazide 25 mg tablet-Take one tablet by mouth daily ?Bupropion 200 mg 12 HR Tablet- Take one tablet by mouth two times daily ? ?Patient denied:  ?Trazodone 50 mg - Take one-two tablets at bedtime PRN ?  ?Any concerns about your medications? No ? ?How often do you forget or accidentally miss a dose? Never ? ?Do you use a pillbox? No ? ?Is patient in packaging No ?  ?Refills requested from providers include: ?Losartan potassium 100 mg - Take one tablet by mouth once daily ? ?Confirmed delivery date of 08/20/2021, advised patient that pharmacy will contact them the morning of delivery. 08/20/2021 ? ?Recent blood pressure readings are as follows: Patient was not at home for blood pressure readings.  ? ?Annual wellness visit in last year? Yes 12/12/2020 ?Most Recent BP reading: 136/82 on 04/01/2021 ? ?[name], CPP notified ? ?Marijean Niemann, RMA ?Clinical Pharmacy Assistant ?[phone] ? ? ?

## 2021-08-21 DIAGNOSIS — M5136 Other intervertebral disc degeneration, lumbar region: Secondary | ICD-10-CM | POA: Diagnosis not present

## 2021-08-21 DIAGNOSIS — M48062 Spinal stenosis, lumbar region with neurogenic claudication: Secondary | ICD-10-CM | POA: Diagnosis not present

## 2021-08-21 DIAGNOSIS — M5416 Radiculopathy, lumbar region: Secondary | ICD-10-CM | POA: Diagnosis not present

## 2021-08-21 DIAGNOSIS — Z79899 Other long term (current) drug therapy: Secondary | ICD-10-CM | POA: Diagnosis not present

## 2021-11-05 ENCOUNTER — Telehealth: Payer: Self-pay

## 2021-11-05 NOTE — Progress Notes (Signed)
    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   buPROPion (WELLBUTRIN SR) 200 MG 12 hr tablet TAKE ONE TABLET BY MOUTH TWICE DAILY   Butenafine HCl 1 % cream Apply one application to rash two times daily for up to two weeks   cholecalciferol (VITAMIN D) 1000 units tablet Take 1,000 Units by mouth daily.   clotrimazole-betamethasone (LOTRISONE) cream Apply 1 application topically twice daily up to two weeks   gabapentin (NEURONTIN) 100 MG capsule Take 100 mg by mouth at bedtime.   hydrochlorothiazide (HYDRODIURIL) 25 MG tablet TAKE ONE TABLET BY MOUTH ONCE DAILY   losartan (COZAAR) 100 MG tablet TAKE ONE TABLET BY MOUTH ONCE DAILY   Omega 3 1000 MG CAPS Take 2 capsules by mouth daily.   potassium citrate (UROCIT-K) 10 MEQ (1080 MG) SR tablet Take 20 mEq by mouth 2 (two) times daily.   traMADol (ULTRAM) 50 MG tablet Take 50 mg by mouth 2 (two) times daily as needed.   traZODone (DESYREL) 50 MG  tablet TAKE 1 OR 2 TABLETS BY MOUTH EVERYDAY AT BEDTIME AS NEEDED   No facility-administered encounter medications on file as of 11/05/2021.   Regina Chapman was contacted to remind of upcoming telephone visit with [name]  on 11/08/21 at 8:45. Patient was reminded to have any blood glucose and blood pressure readings available for review at appointment.   Message was left reminding patient of appointment.  CCM referral has been placed prior to visit?  Yes   Star Rating Drugs: Medication:  Last Fill: Day Supply Losartan 100 mg 08/20/21 [place], CPP notified  Marijean Niemann, [place] Pharm[facility] Assistant [phone]

## 2021-11-07 ENCOUNTER — Telehealth: Payer: Self-pay

## 2021-11-07 NOTE — Progress Notes (Signed)
    Chronic Care Management Pharmacy Assistant   Name: Regina Chapman  MRN: [id number] DOB: 05-Feb-1951  Reason for Encounter: CCM (Medication Adherence and Delivery Coordination)  Recent office visits:  None since last CCM contact  Recent consult visits:  08/21/21 [name], NP (Phys Med) chronic bilateral low back pain No med changes FU PRN  Hospital visits:  None in previous 6 months  Medications: Outpatient Encounter Medications as of 11/07/2021  Medication Sig   Biotin 10000 MCG TABS Take 1 tablet by mouth daily.   buPROPion (WELLBUTRIN SR) 200 MG 12 hr tablet TAKE ONE TABLET BY MOUTH TWICE DAILY   Butenafine HCl 1 % cream Apply one application to rash two times daily for up to two weeks   cholecalciferol (VITAMIN D) 1000 units tablet Take 1,000 Units by mouth daily.   clotrimazole-betamethasone (LOTRISONE) cream Apply 1 application topically twice daily up to two weeks   gabapentin (NEURONTIN) 100 MG capsule Take 100 mg by mouth at bedtime.   hydrochlorothiazide (HYDRODIURIL) 25 MG tablet TAKE ONE TABLET BY MOUTH ONCE DAILY   losartan (COZAAR) 100 MG tablet TAKE ONE TABLET BY MOUTH ONCE DAILY   Omega 3 1000 MG CAPS Take 2 capsules by mouth daily.   potassium citrate (UROCIT-K) 10 MEQ (1080 MG) SR tablet Take 20 mEq by mouth 2 (two) times daily.   traMADol (ULTRAM) 50 MG tablet Take 50 mg by mouth 2 (two) times daily as needed.   traZODone (DESYREL) 50 MG tablet TAKE 1 OR 2 TABLETS BY MOUTH EVERYDAY AT BEDTIME AS NEEDED   No facility-administered encounter medications on file as of 11/07/2021.   BP Readings from Last 3 Encounters:  04/01/21 136/82  12/12/20 102/70  09/17/20 (!) 145/69    No results found for: "HGBA1C"    Recent OV, Consult or Hospital visit:  No medication changes indicated  Last adherence delivery date:08/20/21      Patient is due for next adherence delivery on: 11/18/21  Spoke with patient on 11/07/2021 reviewed medications and coordinated  delivery.  This delivery to include: Vials  90 Days  Losartan potassium 100 mg - Take one tablet by mouth once daily Hydrochlorothiazide 25 mg tablet-Take one tablet by mouth daily Bupropion 200 mg 12 HR Tablet- Take one tablet by mouth two times daily   Patient denied:  Trazodone 50 mg - Take one-two tablets at bedtime PRN   Any concerns about your medications? No   How often do you forget or accidentally miss a dose? Never   Do you use a pillbox? No   Is patient in packaging No              No refill request needed.  Confirmed delivery date of 11/18/2021, advised patient that pharmacy will contact them the morning of delivery.   Recent blood pressure readings are as follows: Patient did not have any readings, but stated she is checking it and her numbers have been doing good.   Annual wellness visit in last year? Yes 12/12/2020 Most Recent BP reading: 136/82 on 04/01/2021   [name], CPP notified   Marijean Niemann, [place] Clinical Pharmacy Assistant [phone]    Cycle dispensing form sent to Margaretmary Dys, PTM for review.

## 2021-11-08 ENCOUNTER — Telehealth: Payer: Medicare Other

## 2021-11-08 NOTE — Progress Notes (Deleted)
Chronic Care Management Pharmacy Note  11/08/2021 Name:  Regina Chapman MRN:  [id number] DOB:  Jan 21, 1951  Summary: ***  Recommendations/Changes made from today's visit: ***  Plan: ***   Subjective: Regina Chapman is an 71 y.o. year old female who is a primary patient of [name], MD.  The CCM team was consulted for assistance with disease management and care coordination needs.    Engaged with patient by telephone for follow up visit in response to provider referral for pharmacy case management and/or care coordination services.   Consent to Services:  The patient was given information about Chronic Care Management services, agreed to services, and gave verbal consent prior to initiation of services.  Please see initial visit note for detailed documentation.   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  07/05/21 Sharlet Salina (Phys Med) DDD (degenerative disc disease) 06/11/21 Sharlet Salina (Phys Med) Lumbar radiculitis  05/23/21 [place] [facility] visits: {Hospital DC Yes/No:25215}   Objective:  Lab Results  Component Value Date   CREATININE 0.77 07/07/2017   BUN 16 07/07/2017   GFR 79.38 07/07/2017   NA 138 07/07/2017   K 4.1 07/07/2017   CALCIUM 10.4 07/07/2017   CO2 32 07/07/2017   GLUCOSE 97 07/07/2017    Lab  Results  Component Value Date/Time   GFR 79.38 07/07/2017 09:55 AM   GFR 78.21 06/22/2017 05:16 PM   MICROALBUR 1.1 06/22/2017 05:16 PM    Last diabetic Eye exam: No results found for: "HMDIABEYEEXA"  Last diabetic Foot exam: No results found for: "HMDIABFOOTEX"   Lab Results  Component Value Date   CHOL 187 04/04/2015   HDL 34.90 (L) 04/04/2015   LDLCALC 131 (H) 04/04/2015   LDLDIRECT 183.9 02/09/2013   TRIG 106.0 04/04/2015   CHOLHDL 5 04/04/2015       Latest Ref Rng & Units 07/07/2017    9:55 AM 06/22/2017    5:16 PM 09/26/2016   10:19 AM  Hepatic Function  Total Protein 6.0 - 8.3 g/dL  7.9  7.8   Albumin 3.5 - 5.2 g/dL 4.2  4.3  4.4   AST 0 - 37 U/L  19  21   ALT 0 - 35 U/L  24  19   Alk Phosphatase 39 - 117 U/L  58  53   Total Bilirubin 0.2 - 1.2 mg/dL  0.5  0.5     Lab Results  Component Value Date/Time   TSH 1.21 02/09/2013 11:33 AM   TSH 1.678 03/13/2009 09:21 PM   FREET4 0.97 06/22/2017 05:16 PM   FREET4 1.54 09/26/2016 10:19 AM       Latest Ref Rng & Units 06/22/2017    5:16 PM 09/26/2016   10:19 AM 01/20/2014   10:32 AM  CBC  WBC 4.0 - 10.5 K/uL 9.8  7.7  8.8   Hemoglobin 12.0 - 15.0 g/dL 14.8  15.3  15.6   Hematocrit 36.0 -  46.0 % 43.2  45.8  46.4   Platelets 150.0 - 400.0 K/uL 290.0  259.0  233.0     No results found for: "VD25OH"  Clinical ASCVD: {YES/NO:21197} The ASCVD Risk score (Arnett DK, et al., 2019) failed to calculate for the following reasons:   Cannot find a previous HDL lab   Cannot find a previous total cholesterol lab       04/01/2021    9:39 AM 12/04/2017    8:42 AM  Depression screen PHQ 2/9  Decreased Interest 0 0  Down, Depressed, Hopeless 0 1  PHQ - 2 Score 0 1  Altered sleeping 0   Tired, decreased energy 0   Change in appetite 0   Feeling bad or failure about yourself  0   Trouble concentrating 0   Moving slowly or fidgety/restless 0   Suicidal thoughts 0   PHQ-9 Score 0      ***Other: (CHADS2VASc if Afib, MMRC or  CAT for COPD, ACT, DEXA)  Social History   Tobacco Use  Smoking Status Never  Smokeless Tobacco Never   BP Readings from Last 3 Encounters:  04/01/21 136/82  12/12/20 102/70  09/17/20 (!) 145/69   Pulse Readings from Last 3 Encounters:  04/01/21 75  12/12/20 80  09/17/20 82   Wt Readings from Last 3 Encounters:  04/01/21 187 lb (84.8 kg)  12/12/20 183 lb (83 kg)  12/09/19 176 lb (79.8 kg)   BMI Readings from Last 3 Encounters:  04/01/21 35.33 kg/m  12/12/20 34.58 kg/m  12/09/19 32.72 kg/m    Assessment/Interventions: Review of patient past medical history, allergies, medications, health status, including review of consultants reports, laboratory and other test data, was performed as part of comprehensive evaluation and provision of chronic care management services.   SDOH:  (Social Determinants of Health) assessments and interventions performed: {yes/no:20286}  SDOH Screenings   Alcohol Screen: Not on file  Depression (PHQ2-9): Low Risk  (04/01/2021)   Depression (PHQ2-9)    PHQ-2 Score: 0  Financial Resource Strain: Low Risk  (11/26/2020)   Overall Financial Resource Strain (CARDIA)    Difficulty of Paying Living Expenses: Not very hard  Food Insecurity: Not on file  Housing: Not on file  Physical Activity: Not on file  Social Connections: Not on file  Stress: Not on file  Tobacco Use: Low Risk  (05/23/2021)   Patient History    Smoking Tobacco Use: Never    Smokeless Tobacco Use: Never    Passive Exposure: Not on file  Transportation Needs: Not on file    CCM Care Plan  Allergies  Allergen Reactions   Augmentin [Amoxicillin-Pot Clavulanate] Nausea And Vomiting    Able to tolerate Amoxil   Cortisone Other (See Comments)    Pt had depomedrol inj and next day had red face,puffiness in face and slight swelling in face; no SOB or swelling in mouth,tongue or throat.  Pt tolerates topical steroid cream.    Medications Reviewed Today     Reviewed by  Eugenia Pancoast, FNP (Family Nurse Practitioner) on 04/09/21 at 1321  Med List Status: <None>   Medication Order Taking? Sig Documenting Provider Last Dose Status Informant  Biotin 10000 MCG TABS [id number] No Take 1 tablet by mouth daily. [provider] Taking Active   buPROPion [facility] SR) 200 MG 12 hr tablet [id number] No TAKE ONE TABLET BY MOUTH TWICE DAILY [name], MD Taking Active   Butenafine HCl 1 % cream [id number]  Apply one application to  rash two times daily for up to two weeks Eugenia Pancoast, FNP  Active   cholecalciferol (VITAMIN D) 1000 units tablet [id number] No Take 1,000 Units by mouth daily. [provider] Taking Active   clotrimazole-betamethasone (LOTRISONE) cream [id number] Yes Apply 1 application topically twice daily up to two weeks Eugenia Pancoast, FNP  Active   gabapentin (NEURONTIN) 100 MG capsule [id number] No Take 100 mg by mouth at bedtime. [provider] Taking Active   hydrochlorothiazide (HYDRODIURIL) 25 MG tablet [id number] No TAKE ONE TABLET BY MOUTH ONCE DAILY [name], MD Taking Active   losartan (COZAAR) 100 MG tablet [id number] No TAKE ONE TABLET BY MOUTH ONCE DAILY [name], MD Taking Active   Omega 3 1000 MG CAPS [id number] No Take 2 capsules by mouth daily. [provider] Taking Active   potassium citrate (UROCIT-K) 10 MEQ (1080 MG) SR tablet [id number] No Take 20 mEq by mouth 2 (two) times daily. [provider] Taking Active   traMADol (ULTRAM) 50 MG tablet [id number] No Take 50 mg by mouth 2 (two) times daily as needed. [provider] Taking Active   traZODone (DESYREL) 50 MG tablet [id number] No TAKE 1 TO 2 TABLETS(50 TO 100 MG) BY MOUTH AT BEDTIME AS NEEDED [name], MD Taking Active             Patient Active Problem List   Diagnosis Date Noted   Intertrigo 04/01/2021   Vertigo 10/06/2019   Kidney stones 12/07/2018   Neuropathy 09/26/2016   Advance  directive discussed with patient 07/20/2015   Metastatic carcinoid tumor ([place]) 04/04/2015   Obesity    Routine general medical examination at a health care facility 02/09/2013   Hyperlipidemia    Hypertension    Recurrent major depression in partial remission ([place]) 09/16/2008    Immunization History  Administered Date(s) Administered   Influenza, High Dose Seasonal PF 04/29/2016, 03/03/2017, 01/06/2019   Influenza, Seasonal, Injecte, Preservative Fre 01/23/2015   Influenza,inj,Quad PF,6+ Mos 02/09/2013, 01/20/2014   Influenza-Unspecified 02/11/2012, 02/22/2018, 02/07/2020   PFIZER(Purple Top)SARS-COV-2 Vaccination 06/22/2019, 07/19/2019, 07/07/2020   Pneumococcal Conjugate-13 07/20/2015   Pneumococcal Polysaccharide-23 09/26/2016   Tdap 02/09/2013    Conditions to be addressed/monitored:  {USCCMDZASSESSMENTOPTIONS:23563}  There are no care plans that you recently modified to display for this patient.    Medication Assistance: {MEDASSISTANCEINFO:25044}  Compliance/Adherence/Medication fill history: Care Gaps: ***  Star-Rating Drugs: ***  Medication Access: Within the past 30 days, how often has patient missed a dose of medication? *** Is a pillbox or other method used to improve adherence? {YES/NO:21197} Factors that may affect medication adherence? {CHL DESC; BARRIERS:21522} Are meds synced by current pharmacy? {YES/NO:21197} Are meds delivered by current pharmacy? {YES/NO:21197} Does patient experience delays in picking up medications due to transportation concerns? {YES/NO:21197}  [facility] Reviewed: Is patient disadvantaged to use [facility]?: {YES/NO:21197} Current Rx insurance plan: *** Name and location of Current pharmacy:  [facility] - [place], [place] - [place] [address] [place] [address] Phone: [phone] Fax: [phone]  [facility] services reviewed with patient today?:  {YES/NO:21197} Patient requests to transfer care to [facility]?: {YES/NO:21197} Reason patient declined to change pharmacies: {US patient preference:27474}   Care Plan and Follow Up Patient Decision:  {FOLLOWUP:24991}  Plan: {CM FOLLOW UP WPVX:48016}  ***    Current Barriers:  None identified  Pharmacist Clinical Goal(s):  Patient will contact provider office for questions/concerns as evidenced notation of same  in electronic health record through collaboration with PharmD and provider.   Interventions: 1:1 collaboration with [name], MD regarding development and update of comprehensive plan of care as evidenced by provider attestation and co-signature Inter-disciplinary care team collaboration (see longitudinal plan of care) Comprehensive medication review performed; medication list updated in electronic medical record  Hypertension  (BP goal <140/90) -Controlled - per home BP readings -Current treatment: HCTZ 25 mg daily Losartan 100 mg daily -Medications previously tried: none   -Current home readings: checks once daily - today 126/68, usually 135-140/67-70 -Current dietary habits: she has increased vegetable intake -Current exercise habits: none formal, stays active  -Denies hypotensive/hypertensive symptoms. Denies falls or imbalance. -Educated on BP goals and benefits of medications for prevention of heart attack, stroke and kidney damage; -Counseled to monitor BP at home weekly, document, and provide log at future appointments -Recommended to continue current medication  Depression/Anxiety (Goal: Improve mood) -Controlled - per patient report -PHQ9: none since 2019, patient reports she is doing well on current therapy  -GAD7: none -Current treatment: Bupropion 200 mg 12 hr BID Trazodone 50 mg - 1-2 tab HS PRN -Medications previously tried/failed: none reported -Recommended to continue current medication  Patient Goals/Self-Care  Activities Patient will:  - contact CCM team with any concerns

## 2021-12-13 ENCOUNTER — Encounter: Payer: Self-pay | Admitting: Internal Medicine

## 2021-12-13 ENCOUNTER — Ambulatory Visit (INDEPENDENT_AMBULATORY_CARE_PROVIDER_SITE_OTHER): Payer: Medicare Other | Admitting: Internal Medicine

## 2021-12-13 VITALS — BP 138/82 | HR 72 | Temp 98.4°F | Ht 61.5 in | Wt 175.0 lb

## 2021-12-13 DIAGNOSIS — Z0000 Encounter for general adult medical examination without abnormal findings: Secondary | ICD-10-CM | POA: Diagnosis not present

## 2021-12-13 DIAGNOSIS — F3341 Major depressive disorder, recurrent, in partial remission: Secondary | ICD-10-CM | POA: Diagnosis not present

## 2021-12-13 DIAGNOSIS — G629 Polyneuropathy, unspecified: Secondary | ICD-10-CM | POA: Diagnosis not present

## 2021-12-13 DIAGNOSIS — N200 Calculus of kidney: Secondary | ICD-10-CM | POA: Diagnosis not present

## 2021-12-13 DIAGNOSIS — I10 Essential (primary) hypertension: Secondary | ICD-10-CM | POA: Diagnosis not present

## 2021-12-13 DIAGNOSIS — C7B00 Secondary carcinoid tumors, unspecified site: Secondary | ICD-10-CM

## 2021-12-13 NOTE — Progress Notes (Signed)
Vision Screening   Right eye Left eye Both eyes  Without correction     With correction 20/40 20/50 20/40   Hearing Screening - Comments:: Passed Whisper Test

## 2021-12-13 NOTE — Assessment & Plan Note (Signed)
Some recent stress but mood generally okay on bupropion 200 bid

## 2021-12-13 NOTE — Assessment & Plan Note (Signed)
Takes gabapentin 100mg  daily Sees Dr Sharlet Salina also

## 2021-12-13 NOTE — Assessment & Plan Note (Signed)
Uses OTC citrate

## 2021-12-13 NOTE — Assessment & Plan Note (Signed)
I have personally reviewed the Medicare Annual Wellness questionnaire and have noted 1. The patient's medical and social history 2. Their use of alcohol, tobacco or illicit drugs 3. Their current medications and supplements 4. The patient's functional ability including ADL's, fall risks, home safety risks and hearing or visual             impairment. 5. Diet and physical activities 6. Evidence for depression or mood disorders  The patients weight, height, BMI and visual acuity have been recorded in the chart I have made referrals, counseling and provided education to the patient based review of the above and I have provided the pt with a written personalized care plan for preventive services.  I have provided you with a copy of your personalized plan for preventive services. Please take the time to review along with your updated medication list.  Colon due next year Recent mammogram--due again by 1/25 No pap due to age Discussed getting back to exercise shingrix at the pharmacy Updated COVID and flu vaccines in the fall

## 2021-12-13 NOTE — Progress Notes (Signed)
Subjective:    Patient ID: Regina Chapman, female    DOB: December 29, 1950, 71 y.o.   MRN: [id number]  HPI Here for Medicare wellness visit and follow up of chronic health conditions Reviewed advanced directives Reviewed other doctors---Dr [facility] oncology, Dr Waymond Cera, Dr Leodis Sias, Dr Lenox Ponds, Dr Janace Litten No surgery or hospitalizations this year Vision okay Hearing is good Occasional alcohol No tobacco Busy caring for mother in law---just went back to her home (so needs to get back to exercise) No falls Mood under control Independent with instrumental ADLs No memory problems  She is doing okay  Some ongoing back problems Sciatica and piriformis syndrome Sees Dr Annita Brod on gabapentin and rare trazodone  Carcinoid still quiet On observation---every 6 months  No recent kidney stones Uses OTC citrate  Depression "okay" Some down times---usually no more than 1 day Mother in law broke hip--and had been at her house Not anhedonic Sleeps okay with trazodone  Current Outpatient Medications on File Prior to Visit  Medication Sig Dispense Refill   Biotin 10000 MCG TABS Take 1 tablet by mouth daily.     buPROPion (WELLBUTRIN SR) 200 MG 12 hr tablet TAKE ONE TABLET BY MOUTH TWICE DAILY 180 tablet 3   cholecalciferol (VITAMIN D) 1000 units tablet Take 1,000 Units by mouth daily.     gabapentin (NEURONTIN) 100 MG capsule Take 100 mg by mouth at bedtime.     hydrochlorothiazide (HYDRODIURIL) 25 MG tablet TAKE ONE TABLET BY MOUTH ONCE DAILY 90 tablet 3   losartan (COZAAR) 100 MG tablet TAKE ONE TABLET BY MOUTH ONCE DAILY 90 tablet 3   Omega 3 1000 MG CAPS Take 2 capsules by mouth daily.     potassium citrate (UROCIT-K) 10 MEQ (1080 MG) SR tablet Take 20 mEq by mouth 2 (two) times daily.     traMADol (ULTRAM) 50 MG tablet Take 50 mg by mouth 2 (two) times daily as needed.     traZODone (DESYREL) 50 MG tablet TAKE 1 OR 2 TABLETS BY MOUTH EVERYDAY AT  BEDTIME AS NEEDED 180 tablet 3   No current facility-administered medications on file prior to visit.    Allergies  Allergen Reactions   Augmentin [Amoxicillin-Pot Clavulanate] Nausea And Vomiting    Able to tolerate Amoxil   Cortisone Other (See Comments)    Pt had depomedrol inj and next day had red face,puffiness in face and slight swelling in face; no SOB or swelling in mouth,tongue or throat.  Pt tolerates topical steroid cream.    Past Medical History:  Diagnosis Date   Cancer ([place])    Melanoma   Carcinoid tumor 2005   in Lungs   Depression    History of melanoma 1985   Hyperlipidemia    Hypertension    Kidney stones    Obesity    Obesity    Psoriasis     Past Surgical History:  Procedure Laterality Date   ABDOMINAL HYSTERECTOMY     CATARACT EXTRACTION W/ INTRAOCULAR LENS  IMPLANT, BILATERAL  2013   COLOSTOMY  2007   LAPAROSCOPIC ASSISTED VAGINAL HYSTERECTOMY  1987   FOR ENDOMETRIOSIS   LUNG REMOVAL, PARTIAL     MELANOMA EXCISION  1985   OOPHORECTOMY      Family History  Problem Relation Age of Onset   Cervical cancer Sister        CERVICAL    Heart disease Mother    Dementia Mother    Breast cancer Other    Diabetes Neg Hx  Hypertension Neg Hx    Colon cancer Neg Hx     Social History   Socioeconomic History   Marital status: Widowed    Spouse name: Not on file   Number of children: 1   Years of education: Not on file   Highest education level: Not on file  Occupational History   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  Tobacco Use   Smoking status: Never    Passive exposure: Past   Smokeless tobacco: Never  Substance and Sexual Activity   Alcohol use: Yes    Alcohol/week: 3.0 standard drinks of alcohol    Types: 3 Glasses of wine per week    Comment: 3 PER WEEK   Drug use: No   Sexual activity: Never    Birth control/protection: Surgical  Other Topics Concern   Not on file  Social History Narrative    No living will or health care POA   Would want son to make decisions   Would accept resuscitation but no prolonged ventilation   No tube feeds if cognitively unaware   Social Determinants of Health   Financial Resource Strain: Low Risk  (11/26/2020)   Overall Financial Resource Strain (CARDIA)    Difficulty of Paying Living Expenses: Not very hard  Food Insecurity: Not on file  Transportation Needs: Not on file  Physical Activity: Not on file  Stress: Not on file  Social Connections: Not on file  Intimate Partner Violence: Not on file   Review of Systems Appetite is good Weight stable Wears seat belt Teeth fine---keeps up with dentist No suspicious skin lesions---occ irritation under fat layer (uses OTC) No heartburn or dysphagia Bowels move fine--no blood  No dysuria. Rare urge leakage No other joint issues No chest pain or SOB No dizziness or syncope No edema    Objective:   Physical Exam Constitutional:      Appearance: Normal appearance.  HENT:     Mouth/Throat:     Comments: No lesions Eyes:     Pupils: Pupils are equal, round, and reactive to light.  Cardiovascular:     Rate and Rhythm: Normal rate and regular rhythm.     Pulses: Normal pulses.     Heart sounds: No murmur heard.    No gallop.  Pulmonary:     Effort: Pulmonary effort is normal.     Breath sounds: Normal breath sounds. No wheezing or rales.  Abdominal:     Palpations: Abdomen is soft.     Tenderness: There is no abdominal tenderness.  Musculoskeletal:     Cervical back: Neck supple.     Right lower leg: No edema.     Left lower leg: No edema.  Lymphadenopathy:     Cervical: No cervical adenopathy.  Skin:    Findings: No lesion or rash.  Neurological:     General: No focal deficit present.     Mental Status: She is alert and oriented to person, place, and time.     Comments: Mini-cog normal  Psychiatric:        Mood and Affect: Mood normal.        Behavior: Behavior normal.             Assessment & Plan:

## 2021-12-13 NOTE — Assessment & Plan Note (Signed)
Has not progressed rapidly enough for treatment Is seen every 6 months at [facility]

## 2021-12-13 NOTE — Assessment & Plan Note (Signed)
BP Readings from Last 3 Encounters:  12/13/21 138/82  04/01/21 136/82  12/12/20 102/70   Controlled on the losartan 100mg  and HCTZ 25 Labs regularly at [facility]

## 2021-12-20 ENCOUNTER — Encounter: Payer: Medicare Other | Admitting: Internal Medicine

## 2022-01-06 DIAGNOSIS — C7839 Secondary malignant neoplasm of other respiratory organs: Secondary | ICD-10-CM | POA: Diagnosis not present

## 2022-01-06 DIAGNOSIS — C7951 Secondary malignant neoplasm of bone: Secondary | ICD-10-CM | POA: Diagnosis not present

## 2022-01-06 DIAGNOSIS — K319 Disease of stomach and duodenum, unspecified: Secondary | ICD-10-CM | POA: Diagnosis not present

## 2022-01-06 DIAGNOSIS — C7B8 Other secondary neuroendocrine tumors: Secondary | ICD-10-CM | POA: Diagnosis not present

## 2022-01-06 DIAGNOSIS — N132 Hydronephrosis with renal and ureteral calculous obstruction: Secondary | ICD-10-CM | POA: Diagnosis not present

## 2022-01-06 DIAGNOSIS — C7A8 Other malignant neuroendocrine tumors: Secondary | ICD-10-CM | POA: Diagnosis not present

## 2022-01-06 DIAGNOSIS — C801 Malignant (primary) neoplasm, unspecified: Secondary | ICD-10-CM | POA: Diagnosis not present

## 2022-01-13 DIAGNOSIS — N200 Calculus of kidney: Secondary | ICD-10-CM | POA: Diagnosis not present

## 2022-01-13 DIAGNOSIS — N201 Calculus of ureter: Secondary | ICD-10-CM | POA: Diagnosis not present

## 2022-01-29 DIAGNOSIS — N200 Calculus of kidney: Secondary | ICD-10-CM | POA: Diagnosis not present

## 2022-01-29 DIAGNOSIS — N201 Calculus of ureter: Secondary | ICD-10-CM | POA: Diagnosis not present

## 2022-02-04 ENCOUNTER — Other Ambulatory Visit: Payer: Self-pay | Admitting: Internal Medicine

## 2022-02-05 ENCOUNTER — Telehealth: Payer: Self-pay

## 2022-02-05 NOTE — Chronic Care Management (AMB) (Signed)
    Chronic Care Management Pharmacy Assistant   Name: Regina Chapman  MRN: [id number] DOB: 07-13-1950  Reason for Encounter: Medication Adherence and Delivery Coordination    Recent office visits:  12/13/21-Richard Letvak,MD(PCP)- [facility],discussed screenings,vaccines,no medication changes, f/u 1 year.  Recent consult visits:  01/29/22-Matthew Klepper,NP(uro)-f/u stones,unsuccessful trial of MET for 91mm right distal ureteral stone. Will arrange for ureteroscopy  01/06/22-Michael Morse(onc)- no data  Hospital visits:  None in previous 6 months  Medications: Outpatient Encounter Medications as of 02/05/2022  Medication Sig   Biotin 10000 MCG TABS Take 1 tablet by mouth daily.   buPROPion (WELLBUTRIN SR) 200 MG 12 hr tablet TAKE ONE TABLET BY MOUTH TWICE DAILY   cholecalciferol (VITAMIN D) 1000 units tablet Take 1,000 Units by mouth daily.   gabapentin (NEURONTIN) 100 MG capsule Take 100 mg by mouth at bedtime.   hydrochlorothiazide (HYDRODIURIL) 25 MG tablet TAKE ONE TABLET BY MOUTH ONCE DAILY   losartan (COZAAR) 100 MG tablet TAKE ONE TABLET BY MOUTH ONCE DAILY   Omega 3 1000 MG CAPS Take 2 capsules by mouth daily.   potassium citrate (UROCIT-K) 10 MEQ (1080 MG) SR tablet Take 20 mEq by mouth 2 (two) times daily.   traMADol (ULTRAM) 50 MG tablet Take 50 mg by mouth 2 (two) times daily as needed.   traZODone (DESYREL) 50 MG tablet TAKE 1 OR 2 TABLETS BY MOUTH EVERYDAY AT BEDTIME AS NEEDED   No facility-administered encounter medications on file as of 02/05/2022.    BP Readings from Last 3 Encounters:  12/13/21 138/82  04/01/21 136/82  12/12/20 102/70    No results found for: "HGBA1C"    Recent OV, Consult or Hospital visit:   PCP/[facility]   Urology/Stones-No medication changes indicated   Last adherence delivery date:11/18/21      Patient is due for next adherence delivery on: 02/18/22  Spoke with patient on 02/05/22 reviewed medications and coordinated delivery.  This  delivery to include: Vials  90 Days  no safety caps  VIAL medications: Losartan potassium 100 mg - Take one tablet by mouth once daily Hydrochlorothiazide 25 mg tablet-Take one tablet by mouth daily Bupropion 200 mg 12 HR Tablet- Take one tablet by mouth two times daily   Patient declined the following medications this month: Trazodone $RemoveBeforeDE'50mg'wuIxCsiltFmQkwC$ -  uses prn   Any concerns about your medications?  The patient reports she has been on Gabapentin $RemoveBefor'100mg'ZEvLiuYENkzp$  1-2 a day(specialist) and she may add to her medication [facility] in the future   How often do you forget or accidentally miss a dose? Never  Do you use a pillbox? No  Is patient in packaging No  Refills requested from providers include: bupropion hcl SR, losartan   Confirmed delivery date of 02/18/22, advised patient that pharmacy will contact them the morning of delivery.  Recent blood pressure readings are as follows:none available   Annual wellness visit in last year? Yes Most Recent BP reading:135/68  Cycle dispensing form sent to [facility], CPP for review.  [name], CPP notified  Avel Sensor, [place]  [phone]

## 2022-02-06 DIAGNOSIS — N202 Calculus of kidney with calculus of ureter: Secondary | ICD-10-CM | POA: Diagnosis not present

## 2022-02-06 DIAGNOSIS — N200 Calculus of kidney: Secondary | ICD-10-CM | POA: Diagnosis not present

## 2022-02-13 DIAGNOSIS — N200 Calculus of kidney: Secondary | ICD-10-CM | POA: Diagnosis not present

## 2022-02-28 DIAGNOSIS — Z23 Encounter for immunization: Secondary | ICD-10-CM | POA: Diagnosis not present

## 2022-03-13 DIAGNOSIS — N132 Hydronephrosis with renal and ureteral calculous obstruction: Secondary | ICD-10-CM | POA: Diagnosis not present

## 2022-03-13 DIAGNOSIS — N201 Calculus of ureter: Secondary | ICD-10-CM | POA: Diagnosis not present

## 2022-03-13 DIAGNOSIS — N202 Calculus of kidney with calculus of ureter: Secondary | ICD-10-CM | POA: Diagnosis not present

## 2022-03-13 DIAGNOSIS — G8929 Other chronic pain: Secondary | ICD-10-CM | POA: Diagnosis not present

## 2022-03-13 DIAGNOSIS — Z801 Family history of malignant neoplasm of trachea, bronchus and lung: Secondary | ICD-10-CM | POA: Diagnosis not present

## 2022-03-13 DIAGNOSIS — I10 Essential (primary) hypertension: Secondary | ICD-10-CM | POA: Diagnosis not present

## 2022-03-13 DIAGNOSIS — Z79899 Other long term (current) drug therapy: Secondary | ICD-10-CM | POA: Diagnosis not present

## 2022-03-13 DIAGNOSIS — Z9889 Other specified postprocedural states: Secondary | ICD-10-CM | POA: Diagnosis not present

## 2022-03-13 DIAGNOSIS — N200 Calculus of kidney: Secondary | ICD-10-CM | POA: Diagnosis not present

## 2022-03-13 DIAGNOSIS — M4800 Spinal stenosis, site unspecified: Secondary | ICD-10-CM | POA: Diagnosis not present

## 2022-03-13 DIAGNOSIS — Z8249 Family history of ischemic heart disease and other diseases of the circulatory system: Secondary | ICD-10-CM | POA: Diagnosis not present

## 2022-03-18 DIAGNOSIS — N200 Calculus of kidney: Secondary | ICD-10-CM | POA: Diagnosis not present

## 2022-03-22 DIAGNOSIS — N200 Calculus of kidney: Secondary | ICD-10-CM | POA: Diagnosis not present

## 2022-03-22 DIAGNOSIS — R059 Cough, unspecified: Secondary | ICD-10-CM | POA: Diagnosis not present

## 2022-03-22 DIAGNOSIS — R058 Other specified cough: Secondary | ICD-10-CM | POA: Diagnosis not present

## 2022-03-22 DIAGNOSIS — R7881 Bacteremia: Secondary | ICD-10-CM | POA: Diagnosis present

## 2022-03-22 DIAGNOSIS — N2889 Other specified disorders of kidney and ureter: Secondary | ICD-10-CM | POA: Diagnosis not present

## 2022-03-22 DIAGNOSIS — R070 Pain in throat: Secondary | ICD-10-CM | POA: Diagnosis not present

## 2022-03-22 DIAGNOSIS — R5383 Other fatigue: Secondary | ICD-10-CM | POA: Diagnosis not present

## 2022-03-22 DIAGNOSIS — N136 Pyonephrosis: Secondary | ICD-10-CM | POA: Diagnosis present

## 2022-03-22 DIAGNOSIS — Z90710 Acquired absence of both cervix and uterus: Secondary | ICD-10-CM | POA: Diagnosis not present

## 2022-03-22 DIAGNOSIS — Z87442 Personal history of urinary calculi: Secondary | ICD-10-CM | POA: Diagnosis not present

## 2022-03-22 DIAGNOSIS — I10 Essential (primary) hypertension: Secondary | ICD-10-CM | POA: Diagnosis present

## 2022-03-22 DIAGNOSIS — N1330 Unspecified hydronephrosis: Secondary | ICD-10-CM | POA: Diagnosis not present

## 2022-03-22 DIAGNOSIS — R509 Fever, unspecified: Secondary | ICD-10-CM | POA: Diagnosis not present

## 2022-03-22 DIAGNOSIS — R319 Hematuria, unspecified: Secondary | ICD-10-CM | POA: Diagnosis not present

## 2022-03-22 DIAGNOSIS — B961 Klebsiella pneumoniae [K. pneumoniae] as the cause of diseases classified elsewhere: Secondary | ICD-10-CM | POA: Diagnosis present

## 2022-03-22 DIAGNOSIS — R9349 Abnormal radiologic findings on diagnostic imaging of other urinary organs: Secondary | ICD-10-CM | POA: Diagnosis not present

## 2022-03-23 DIAGNOSIS — Z87442 Personal history of urinary calculi: Secondary | ICD-10-CM | POA: Diagnosis not present

## 2022-03-23 DIAGNOSIS — R7881 Bacteremia: Secondary | ICD-10-CM | POA: Diagnosis present

## 2022-03-23 DIAGNOSIS — R509 Fever, unspecified: Secondary | ICD-10-CM | POA: Diagnosis not present

## 2022-03-23 DIAGNOSIS — R9349 Abnormal radiologic findings on diagnostic imaging of other urinary organs: Secondary | ICD-10-CM | POA: Diagnosis not present

## 2022-03-23 DIAGNOSIS — Z90710 Acquired absence of both cervix and uterus: Secondary | ICD-10-CM | POA: Diagnosis not present

## 2022-03-23 DIAGNOSIS — N1330 Unspecified hydronephrosis: Secondary | ICD-10-CM | POA: Diagnosis not present

## 2022-03-23 DIAGNOSIS — B961 Klebsiella pneumoniae [K. pneumoniae] as the cause of diseases classified elsewhere: Secondary | ICD-10-CM | POA: Diagnosis present

## 2022-03-23 DIAGNOSIS — N2889 Other specified disorders of kidney and ureter: Secondary | ICD-10-CM | POA: Diagnosis not present

## 2022-03-23 DIAGNOSIS — N200 Calculus of kidney: Secondary | ICD-10-CM | POA: Diagnosis not present

## 2022-03-23 DIAGNOSIS — N136 Pyonephrosis: Secondary | ICD-10-CM | POA: Diagnosis present

## 2022-03-23 DIAGNOSIS — I10 Essential (primary) hypertension: Secondary | ICD-10-CM | POA: Diagnosis present

## 2022-03-26 ENCOUNTER — Telehealth: Payer: Self-pay | Admitting: *Deleted

## 2022-03-26 NOTE — Patient Outreach (Signed)
  Care Coordination [facility] Note Transition Care Management Unsuccessful Follow-up Telephone Call  Date of discharge and from where:  [facility] [phone]   Attempts:  1st Attempt  Reason for unsuccessful TCM follow-up call:  Left voice message  [place] Management [phone]

## 2022-03-26 NOTE — Patient Outreach (Signed)
  Care Coordination [facility] Note Transition Care Management Follow-up Telephone Call Date of discharge and from where: [facility] 03/25/2022 How have you been since you were released from the hospital? It was a little more than I expected. I feel tired Any questions or concerns? Y  Items Reviewed: Did the pt receive and understand the discharge instructions provided? No  Medications obtained and verified? Yes  Patient had questions on medications for verification. RN went over for better understanding. Other? Yes  Instructed to hold losartan while completing bactrim course - can restart after bactrim course completed, discussed to follow up with PCP regarding anti-hypertensive medications. Any new allergies since your discharge? No  Dietary orders reviewed? No Do you have support at home? Yes   Home Care and Equipment/Supplies: Were home health services ordered? not applicable If so, what is the name of the agency? N   Has the agency set up a time to come to the patient's home? no Were any new equipment or medical supplies ordered?  N  What is the name of the medical supply agency? N/a Were you able to get the supplies/equipment? not applicable Do you have any questions related to the use of the equipment or supplies? No  Functional Questionnaire: (I = Independent and D = Dependent) ADLs: I  Bathing/Dressing- I  Meal Prep- I  Eating- I  Maintaining continence- I  Transferring/Ambulation- I  Managing Meds- I  Follow up appointments reviewed:  PCP Hospital f/u appt confirmed? No   Specialist Hospital f/u appt confirmed? Yes  . 05/14/2022 11:30 AM DMP [place] 2 DMPULTRA DMP 05/14/2022 1:30 PM [facility] 05/14/2022 2:00 PM Klepper, Lucille Passy, NP [facility] Are transportation arrangements needed? No  If their condition worsens, is the pt aware to call PCP or go to the Emergency Dept.? Yes Was the patient provided with contact information for the  PCP's office or ED? Yes Was to pt encouraged to call back with questions or concerns? Yes  SDOH assessments and interventions completed:   Yes  SDOH Screenings   Food Insecurity: No Food Insecurity (03/26/2022)  Housing: Low Risk  (03/26/2022)  Transportation Needs: No Transportation Needs (03/26/2022)  Utilities: Not At Risk (03/26/2022)  Depression (PHQ2-9): Low Risk  (04/01/2021)  Financial Resource Strain: Low Risk  (11/26/2020)  Tobacco Use: Low Risk  (12/13/2021)     Care Coordination Interventions:  PCP follow up appointment requested RN sent PCP notice to make aware that patient had been in [facility]    Encounter Outcome:  Pt. Visit Completed   [place] Management [phone]

## 2022-03-26 NOTE — Progress Notes (Signed)
  Care Coordination  Note  03/26/2022 Name: BYRON TIPPING MRN: [id number] DOB: [birth year]  Dory Horn is a 71 y.o. year old primary care patient of Letvak, Theophilus Kinds, MD. I reached out to Dory Horn by phone today to assist with scheduling a follow up appointment. Dory Horn verbally consented to my assistance.       Follow up plan: Hospital Follow Up appointment scheduled with Dr [name] on 04/08/2022 at 12pm.  Julian Hy, [place] Direct Dial: [phone]

## 2022-04-01 ENCOUNTER — Telehealth: Payer: Self-pay

## 2022-04-01 NOTE — Progress Notes (Addendum)
Chronic Care Management Pharmacy Assistant   Name: SHONTAY WALLNER  MRN: [id number] DOB: 09-04-1950  Reason for Encounter: CCM [facility] Follow-Up)  Medications: Outpatient Encounter Medications as of 04/01/2022  Medication Sig   Biotin 10000 MCG TABS Take 1 tablet by mouth daily.   buPROPion (WELLBUTRIN SR) 200 MG 12 hr tablet TAKE ONE TABLET BY MOUTH TWICE DAILY   cholecalciferol (VITAMIN D) 1000 units tablet Take 1,000 Units by mouth daily.   gabapentin (NEURONTIN) 100 MG capsule Take 100 mg by mouth at bedtime.   hydrochlorothiazide (HYDRODIURIL) 25 MG tablet TAKE ONE TABLET BY MOUTH ONCE DAILY   losartan (COZAAR) 100 MG tablet TAKE ONE TABLET BY MOUTH ONCE DAILY   Omega 3 1000 MG CAPS Take 2 capsules by mouth daily.   potassium citrate (UROCIT-K) 10 MEQ (1080 MG) SR tablet Take 20 mEq by mouth 2 (two) times daily.   traMADol (ULTRAM) 50 MG tablet Take 50 mg by mouth 2 (two) times daily as needed.   traZODone (DESYREL) 50 MG tablet TAKE 1 OR 2 TABLETS BY MOUTH EVERYDAY AT BEDTIME AS NEEDED   No facility-administered encounter medications on file as of 04/01/2022.     Reviewed hospital notes for details of recent visit. Has patient been contacted by Transitions of Care team? Yes Has patient seen PCP/specialist for hospital follow up (summarize OV if yes): No  Admitted to the hospital on 03/22/2022. Discharge date was 03/25/2022.  Discharged from [facility].   Discharge diagnosis (Principal Problem): Nephrolithiasis   Patient was discharged to Home  Brief summary of hospital course: She presented to [facility] on 03/22/2022 with presentation as above. Fever, chills malaise back pain cloudy urine and aches s/p ureteroscopy with laser lithotripsy on 03/13/22 with Dr. Cyd Silence with home stent removal on 03/18/22. Labs on admission notable for WBC 12.7*, Hgb 13.7, Cr 0.8 (baseline). UA is nitrite positive with 3+ leukocytes, >182 WBC/hpf, 22  RBC/hpf. CT with ureterectasis without ureteral stone.  Admitted to urology service for management of complicated UTI. She was started on ceftriaxone for broad spectrum empiric antibiosis. She remained hemodynamically stable on broad spectrum antibiotics, and her last fever was noted to be on 11/26. Blood cultures from 11/26 found to be positive for Klebsiella pneumonia. After sensitivities resulted on 11/28 she was transitioned to bactrim DS BID for antibiotics and instructed to complete a 11 day course for a total of 14 days of antibiotics. Surveillance blood cultures collected on 11/27 showed no growth at 24 hours. Urine culture from 11/27 with no growth.  She was ambulating the halls several times per day with minimal assistance. She had restored GI function and was tolerating a regular diet.  She was felt suitable for discharge to home on 03/25/2022 with the following recommendations:  1) Complete course of Bactrim 2 DS tablets BID for 11 days to complete a total 14 day course of antibiotics 2) Stone follow up on 05/14/2022 3) Instructed to hold losartan while completing bactrim course - can restart after bactrim course completed, discussed to follow up with PCP regarding anti-hypertensive medications.  New?Medications Started at [facility] Discharge:?? -Started Bactrim  Medication Changes at Hospital Discharge: -Changed sulfamethoxazole-trimethoprim (BACTRIM DS) 800-160 mg tablet   Medications Discontinued at Hospital Discharge: -Stopped sennosides-docusate (SENOKOT-S) 8.6-50 mg tablet   Medications that remain the same after Hospital Discharge:??  -All other medications will remain the same.    Next CCM appt: None scheduled  Other upcoming appts: PCP appointment on 04/08/2022  [name], PharmD notified and will determine if action is needed.  Marijean Niemann, [place] Pharmacy Assistant [phone]   Pharmacist addendum: Reviewed chart - no PharmD intervention  required.  [name], PharmD, BCACP 04/18/22 10:41 AM

## 2022-04-08 ENCOUNTER — Encounter: Payer: Self-pay | Admitting: Internal Medicine

## 2022-04-08 ENCOUNTER — Ambulatory Visit (INDEPENDENT_AMBULATORY_CARE_PROVIDER_SITE_OTHER): Payer: Medicare Other | Admitting: Internal Medicine

## 2022-04-08 VITALS — BP 108/64 | HR 76 | Temp 97.9°F | Ht 61.5 in | Wt 168.0 lb

## 2022-04-08 DIAGNOSIS — A414 Sepsis due to anaerobes: Secondary | ICD-10-CM | POA: Diagnosis not present

## 2022-04-08 MED ORDER — SULFAMETHOXAZOLE-TRIMETHOPRIM 800-160 MG PO TABS: 1.0000 | ORAL_TABLET | Freq: Two times a day (BID) | ORAL | 0 refills | Status: DC

## 2022-04-08 NOTE — Assessment & Plan Note (Signed)
Presumably from the stent removal and instrumentation Still has significant--though improved--stone burden and going back to urology Will give Rx for bactrim DS for milder urine symptoms (and then she needs OV as soon as possible) If chills/systemic symptoms again--needs emergency evaluation (call EMS)

## 2022-04-08 NOTE — Progress Notes (Signed)
Subjective:    Patient ID: Regina Chapman, female    DOB: 1951/04/03, 71 y.o.   MRN: [id number]  HPI Here for hospital follow up Has stone treatment and a stent not quite a month ago Went back to urologist and they pulled the stent  3 days later--felt bad and then really bad the next day High fever--hurting all over No specific change in urinary symptoms No SOB--just slight cough Noted fast heart rate Called son/DIL---she is paramedic so she brought her  Admitted for possible UTI Then grew Klebsiella pneumoniae in the blood also Fluids, IV antibiotics----records reviewed Sent home to finish 2 weeks of antibiotics (septra)--now done  Feels better--but still not 100% Going back to urologist Jan 17th  Current Outpatient Medications on File Prior to Visit  Medication Sig Dispense Refill   Biotin 10000 MCG TABS Take 1 tablet by mouth daily.     buPROPion (WELLBUTRIN SR) 200 MG 12 hr tablet TAKE ONE TABLET BY MOUTH TWICE DAILY 180 tablet 3   cholecalciferol (VITAMIN D) 1000 units tablet Take 1,000 Units by mouth daily.     gabapentin (NEURONTIN) 100 MG capsule Take 100 mg by mouth at bedtime.     hydrochlorothiazide (HYDRODIURIL) 25 MG tablet TAKE ONE TABLET BY MOUTH ONCE DAILY 90 tablet 3   losartan (COZAAR) 100 MG tablet TAKE ONE TABLET BY MOUTH ONCE DAILY 90 tablet 3   Omega 3 1000 MG CAPS Take 2 capsules by mouth daily.     potassium citrate (UROCIT-K) 10 MEQ (1080 MG) SR tablet Take 20 mEq by mouth 2 (two) times daily.     traMADol (ULTRAM) 50 MG tablet Take 50 mg by mouth 2 (two) times daily as needed.     traZODone (DESYREL) 50 MG tablet TAKE 1 OR 2 TABLETS BY MOUTH EVERYDAY AT BEDTIME AS NEEDED 180 tablet 3   No current facility-administered medications on file prior to visit.    Allergies  Allergen Reactions   Augmentin [Amoxicillin-Pot Clavulanate] Nausea And Vomiting    Able to tolerate Amoxil   Cortisone Other (See Comments)    Pt had depomedrol inj and next day  had red face,puffiness in face and slight swelling in face; no SOB or swelling in mouth,tongue or throat.  Pt tolerates topical steroid cream.    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   Depression    History of melanoma 1985   Hyperlipidemia    Hypertension    Kidney stones    Obesity    Obesity    Psoriasis     Past Surgical History:  Procedure Laterality Date   ABDOMINAL HYSTERECTOMY     CATARACT EXTRACTION W/ INTRAOCULAR LENS  IMPLANT, BILATERAL  2013   COLOSTOMY  2007   LAPAROSCOPIC ASSISTED VAGINAL HYSTERECTOMY  1987   FOR ENDOMETRIOSIS   LUNG REMOVAL, PARTIAL     MELANOMA EXCISION  1985   OOPHORECTOMY      Family History  Problem Relation Age of Onset   Cervical cancer Sister        CERVICAL    Heart disease Mother    Dementia Mother    Breast cancer Other    Diabetes Neg Hx    Hypertension Neg Hx    Colon cancer Neg Hx     Social History   Socioeconomic History   Marital status: Widowed    Spouse name: Not on file  Number of children: 1   Years of education: Not on file   Highest education level: Not on file  Occupational History   Occupation: Accounting and customer service    Comment: Retired 2013  Tobacco Use   Smoking status: Never    Passive exposure: Past   Smokeless tobacco: Never  Substance and Sexual Activity   Alcohol use: Yes    Alcohol/week: 3.0 standard drinks of alcohol    Types: 3 Glasses of wine per week    Comment: 3 PER WEEK   Drug use: No   Sexual activity: Never    Birth control/protection: Surgical  Other Topics Concern   Not on file  Social History Narrative   No living will or health care POA   Would want son to make decisions   Would accept resuscitation but no prolonged ventilation   No tube feeds if cognitively unaware   Social Determinants of Health   Financial Resource Strain: Low Risk  (11/26/2020)   Overall Financial Resource Strain (CARDIA)     Difficulty of Paying Living Expenses: Not very hard  Food Insecurity: No Food Insecurity (03/26/2022)   Hunger Vital Sign    Worried About Running Out of Food in the Last Year: Never true    Ran Out of Food in the Last Year: Never true  Transportation Needs: No Transportation Needs (03/26/2022)   PRAPARE - Hydrologist (Medical): No    Lack of Transportation (Non-Medical): No  Physical Activity: Not on file  Stress: Not on file  Social Connections: Not on file  Intimate Partner Violence: Not on file   Review of Systems N/V have resolved Not eating well--lost about 10#--but eating better now Sleeps okay    Objective:   Physical Exam Constitutional:      Appearance: Normal appearance.  Abdominal:     Palpations: Abdomen is soft.     Tenderness: There is no abdominal tenderness. There is no right CVA tenderness or left CVA tenderness.  Neurological:     Mental Status: She is alert.            Assessment & Plan:

## 2022-05-06 ENCOUNTER — Telehealth: Payer: Self-pay

## 2022-05-06 NOTE — Progress Notes (Signed)
Care Management & Coordination Services Pharmacy Team  Reason for Encounter: Medication coordination and delivery  Contacted patient on 05/06/2022 to discuss medications   Recent office visits:  04/08/22 Viviana Simpler, MD Sepsis due to Klebsiella pneumoniae Start:sulfamethoxazole-trimethoprim (BACTRIM DS) 800-160 MG tablet    Recent consult visits:  None since last CCM contact  Hospital visits:  None in previous 6 months  Medications: Outpatient Encounter Medications as of 05/06/2022  Medication Sig   Biotin 10000 MCG TABS Take 1 tablet by mouth daily.   buPROPion (WELLBUTRIN SR) 200 MG 12 hr tablet TAKE ONE TABLET BY MOUTH TWICE DAILY   cholecalciferol (VITAMIN D) 1000 units tablet Take 1,000 Units by mouth daily.   gabapentin (NEURONTIN) 100 MG capsule Take 100 mg by mouth at bedtime.   hydrochlorothiazide (HYDRODIURIL) 25 MG tablet TAKE ONE TABLET BY MOUTH ONCE DAILY   losartan (COZAAR) 100 MG tablet TAKE ONE TABLET BY MOUTH ONCE DAILY   Omega 3 1000 MG CAPS Take 2 capsules by mouth daily.   potassium citrate (UROCIT-K) 10 MEQ (1080 MG) SR tablet Take 20 mEq by mouth 2 (two) times daily.   sulfamethoxazole-trimethoprim (BACTRIM DS) 800-160 MG tablet Take 1 tablet by mouth 2 (two) times daily.   traMADol (ULTRAM) 50 MG tablet Take 50 mg by mouth 2 (two) times daily as needed.   traZODone (DESYREL) 50 MG tablet TAKE 1 OR 2 TABLETS BY MOUTH EVERYDAY AT BEDTIME AS NEEDED   No facility-administered encounter medications on file as of 05/06/2022.   BP Readings from Last 3 Encounters:  04/08/22 108/64  12/13/21 138/82  04/01/21 136/82    Pulse Readings from Last 3 Encounters:  04/08/22 76  12/13/21 72  04/01/21 75    No results found for: "HGBA1C" Lab Results  Component Value Date   CREATININE 0.77 07/07/2017   BUN 16 07/07/2017   GFR 79.38 07/07/2017   NA 138 07/07/2017   K 4.1 07/07/2017   CALCIUM 10.4 07/07/2017   CO2 32 07/07/2017    Last adherence delivery date:  02/18/2022  Patient is due for next adherence delivery on: 05/16/2022  Spoke with patient on 05/06/2022 reviewed medications and coordinated delivery.  This delivery to include: Vials  90 Days  no safety caps   VIAL medications: Hydrochlorothiazide 25 mg tablet-Take one tablet by mouth daily Bupropion 200 mg 12 HR Tablet- Take one tablet by mouth two times daily Losartan potassium 100 mg - Take one tablet by mouth once daily Trazodone 50mg -  uses prn  Patient declined the following medications this month:  No refill request needed.  Confirmed delivery date of 05/16/2022, advised patient that pharmacy will contact them the morning of delivery.   Any concerns about your medications? No  How often do you forget or accidentally miss a dose? Never  Do you use a pillbox? No  Is patient in packaging No   Recent blood pressure readings are as follows: Patient has not kept a log, but stated her numbers have been running in the normal range and has no concerns.   Cycle dispensing form sent to [name], PharmD for review.  Marijean Niemann, CMA

## 2022-06-04 ENCOUNTER — Other Ambulatory Visit: Payer: Self-pay | Admitting: Internal Medicine

## 2022-06-05 ENCOUNTER — Telehealth: Payer: Self-pay

## 2022-06-05 NOTE — Progress Notes (Signed)
Care Management & Coordination Services Pharmacy Team  Reason for Encounter: Medication coordination and delivery  Contacted patient to discuss medications and coordinate delivery from [facility].  Unsuccessful outreach. Left voicemail for patient to return call.  Cycle dispensing form sent to [name], PharmD for review.   Last adherence delivery date: 05/16/2022      Patient is due for next adherence delivery on: 06/17/2022  This delivery to include: Vials  90 Days  no safety caps   VIAL medications: Trazodone 50mg -  uses prn Hydrochlorothiazide 25 mg tablet-Take one tablet by mouth daily Bupropion 200 mg 12 HR Tablet- Take one tablet by mouth two times daily Losartan potassium 100 mg - Take one tablet by mouth once daily   No refill request needed.  Delivery scheduled for 06/17/2022. Unable to speak with patient to confirm date.    Chart review: Recent office visits:  None since last contact  Recent consult visits:  Patient has seen Shane Crutch with Radiology at [facility], but there is no information on visits.   Hospital visits:  None since last contact  Medications: Outpatient Encounter Medications as of 06/05/2022  Medication Sig   Biotin 10000 MCG TABS Take 1 tablet by mouth daily.   buPROPion (WELLBUTRIN SR) 200 MG 12 hr tablet TAKE ONE TABLET BY MOUTH TWICE DAILY   cholecalciferol (VITAMIN D) 1000 units tablet Take 1,000 Units by mouth daily.   gabapentin (NEURONTIN) 100 MG capsule Take 100 mg by mouth at bedtime.   hydrochlorothiazide (HYDRODIURIL) 25 MG tablet TAKE ONE TABLET BY MOUTH ONCE DAILY   losartan (COZAAR) 100 MG tablet TAKE ONE TABLET BY MOUTH ONCE DAILY   Omega 3 1000 MG CAPS Take 2 capsules by mouth daily.   potassium citrate (UROCIT-K) 10 MEQ (1080 MG) SR tablet Take 20 mEq by mouth 2 (two) times daily.   sulfamethoxazole-trimethoprim (BACTRIM DS) 800-160 MG tablet Take 1 tablet by mouth 2 (two) times daily.   traMADol (ULTRAM) 50  MG tablet Take 50 mg by mouth 2 (two) times daily as needed.   traZODone (DESYREL) 50 MG tablet TAKE 1 TO 2 TABLETS BY MOUTH EVERYDAY AT BEDTIME AS NEEDED   No facility-administered encounter medications on file as of 06/05/2022.   BP Readings from Last 3 Encounters:  04/08/22 108/64  12/13/21 138/82  04/01/21 136/82    Pulse Readings from Last 3 Encounters:  04/08/22 76  12/13/21 72  04/01/21 75    No results found for: "HGBA1C" Lab Results  Component Value Date   CREATININE 0.77 07/07/2017   BUN 16 07/07/2017   GFR 79.38 07/07/2017   NA 138 07/07/2017   K 4.1 07/07/2017   CALCIUM 10.4 07/07/2017   CO2 32 07/07/2017   [name], PharmD notified  Marijean Niemann, [place] Clinical Pharmacy Assistant [phone]

## 2022-07-15 ENCOUNTER — Encounter: Payer: Self-pay | Admitting: Internal Medicine

## 2022-08-11 ENCOUNTER — Other Ambulatory Visit: Payer: Self-pay | Admitting: Internal Medicine

## 2022-09-24 ENCOUNTER — Encounter: Payer: Self-pay | Admitting: Gastroenterology

## 2022-10-22 ENCOUNTER — Telehealth: Payer: Self-pay | Admitting: Internal Medicine

## 2022-10-22 NOTE — Telephone Encounter (Signed)
Called pt to advise that she has a rx from 02-04-22 #180/3. She said she spoke to the pharmacy this morning and told her she did not have refills. Pt realized she was giving them information from an old bottle of 150 mg. She will call them back.

## 2022-10-22 NOTE — Telephone Encounter (Signed)
Prescription Request  10/22/2022  LOV: 04/08/2022  What is the name of the medication or equipment? buPROPion (WELLBUTRIN SR) 200 MG 12 hr tablet   Have you contacted your pharmacy to request a refill? Yes   Which pharmacy would you like this sent to?  [facility] - [place], [place] - [address] [place] [address] Phone: [phone] Fax: [phone]    Patient notified that their request is being sent to the clinical staff for review and that they should receive a response within 2 business days.   Please advise at Mobile [phone] (mobile)

## 2022-11-26 ENCOUNTER — Encounter (INDEPENDENT_AMBULATORY_CARE_PROVIDER_SITE_OTHER): Payer: Self-pay

## 2022-11-28 ENCOUNTER — Encounter: Payer: Self-pay | Admitting: Gastroenterology

## 2022-11-28 ENCOUNTER — Other Ambulatory Visit: Payer: Self-pay | Admitting: Internal Medicine

## 2022-11-28 DIAGNOSIS — Z1231 Encounter for screening mammogram for malignant neoplasm of breast: Secondary | ICD-10-CM

## 2022-12-11 ENCOUNTER — Ambulatory Visit
Admission: RE | Admit: 2022-12-11 | Discharge: 2022-12-11 | Disposition: A | Discharge status: Home / Self Care | Payer: Medicare Other | Source: Ambulatory Visit | Attending: Internal Medicine | Admitting: Internal Medicine

## 2022-12-11 DIAGNOSIS — Z1231 Encounter for screening mammogram for malignant neoplasm of breast: Secondary | ICD-10-CM | POA: Insufficient documentation

## 2022-12-16 ENCOUNTER — Encounter: Payer: Self-pay | Admitting: Internal Medicine

## 2022-12-16 ENCOUNTER — Ambulatory Visit (INDEPENDENT_AMBULATORY_CARE_PROVIDER_SITE_OTHER): Payer: Medicare Other | Admitting: Internal Medicine

## 2022-12-16 VITALS — BP 122/80 | HR 78 | Temp 97.6°F | Ht 60.75 in | Wt 166.0 lb

## 2022-12-16 DIAGNOSIS — C7B00 Secondary carcinoid tumors, unspecified site: Secondary | ICD-10-CM | POA: Diagnosis not present

## 2022-12-16 DIAGNOSIS — G629 Polyneuropathy, unspecified: Secondary | ICD-10-CM

## 2022-12-16 DIAGNOSIS — I10 Essential (primary) hypertension: Secondary | ICD-10-CM

## 2022-12-16 DIAGNOSIS — F3341 Major depressive disorder, recurrent, in partial remission: Secondary | ICD-10-CM | POA: Diagnosis not present

## 2022-12-16 DIAGNOSIS — Z0000 Encounter for general adult medical examination without abnormal findings: Secondary | ICD-10-CM | POA: Diagnosis not present

## 2022-12-16 DIAGNOSIS — N200 Calculus of kidney: Secondary | ICD-10-CM

## 2022-12-16 DIAGNOSIS — E785 Hyperlipidemia, unspecified: Secondary | ICD-10-CM

## 2022-12-16 LAB — LIPID PANEL
Cholesterol: 186 mg/dL (ref 0–200)
HDL: 48.7 mg/dL (ref >39.00)
LDL Cholesterol: 118 mg/dL — ABNORMAL HIGH (ref 0–99)
NonHDL: 137.42
Total CHOL/HDL Ratio: 4
Triglycerides: 98 mg/dL (ref 0.0–149.0)
VLDL: 19.6 mg/dL (ref 0.0–40.0)

## 2022-12-16 NOTE — Progress Notes (Signed)
Subjective:    Patient ID: Regina Chapman, female    DOB: 1950/12/04, 72 y.o.   MRN: [id number]  HPI Here for Medicare wellness visit and follow up of chronic health conditions Reviewed advanced directives Reviewed other doctors---Dr [name]--physiatry, Dr Claudine Mouton, Dr Morse--oncology, Dr Lenell Antu, Dr Emmie Niemann, Dr Denyse Amass One hospitalizations in the fall--Klebsiella sepsis from kidney stone Inconsistent with exercise---has home plan.  Vision is okay--has glasses for nighttime Hearing is fine Occasional beer or wine No tobacco No falls Independent with instrumental ADLs No sig memory issues--just typical recall  Continues with urologist for recurrent stones On potassium citrate  Known metastatic carcinoid Stable so no treatment Regular [facility] follow up  Depression comes on at times Feels bupropion is effective Still lots of time caring for mother in law (lives in [place]) Sleeps okay with trazodone---usually just 50mg    No chest pain or SOB No dizziness or syncope Rare headaches No palpitations No edema  Continues on gabapentin--helps the neuropathy  Current Outpatient Medications on File Prior to Visit  Medication Sig Dispense Refill   Biotin 95284 MCG TABS Take 1 tablet by mouth daily.     buPROPion (WELLBUTRIN SR) 200 MG 12 hr tablet TAKE ONE TABLET BY MOUTH TWICE DAILY 180 tablet 3   cholecalciferol (VITAMIN D) 1000 units tablet Take 1,000 Units by mouth daily.     gabapentin (NEURONTIN) 100 MG capsule Take 100 mg by mouth at bedtime.     hydrochlorothiazide (HYDRODIURIL) 25 MG tablet TAKE ONE TABLET BY MOUTH ONCE DAILY 90 tablet 3   losartan (COZAAR) 100 MG tablet TAKE ONE TABLET BY MOUTH ONCE DAILY 90 tablet 3   Omega 3 1000 MG CAPS Take 2 capsules by mouth daily.     potassium citrate (UROCIT-K) 10 MEQ (1080 MG) SR tablet Take 20 mEq by mouth 2 (two) times daily.     traMADol (ULTRAM) 50 MG tablet Take 50 mg by mouth 2 (two)  times daily as needed.     traZODone (DESYREL) 50 MG tablet TAKE 1 TO 2 TABLETS BY MOUTH EVERYDAY AT BEDTIME AS NEEDED 180 tablet 3   No current facility-administered medications on file prior to visit.    Allergies  Allergen Reactions   Augmentin [Amoxicillin-Pot Clavulanate] Nausea And Vomiting    Able to tolerate Amoxil   Cortisone Other (See Comments)    Pt had depomedrol inj and next day had red face,puffiness in face and slight swelling in face; no SOB or swelling in mouth,tongue or throat.  Pt tolerates topical steroid cream.    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   Depression    History of melanoma 1985   Hyperlipidemia    Hypertension    Kidney stones    Obesity    Obesity    Psoriasis     Past Surgical History:  Procedure Laterality Date   ABDOMINAL HYSTERECTOMY     CATARACT EXTRACTION W/ INTRAOCULAR LENS  IMPLANT, BILATERAL  2013   COLOSTOMY  2007   LAPAROSCOPIC ASSISTED VAGINAL HYSTERECTOMY  1987   FOR ENDOMETRIOSIS   LUNG REMOVAL, PARTIAL     MELANOMA EXCISION  1985   OOPHORECTOMY      Family History  Problem Relation Age of Onset   Cervical cancer Sister        CERVICAL    Heart disease Mother    Dementia Mother    Breast cancer  Other    Diabetes Neg Hx    Hypertension Neg Hx    Colon cancer Neg Hx     Social History   Socioeconomic History   Marital status: Widowed    Spouse name: Not on file   Number of children: 1   Years of education: Not on file   Highest education level: Not on file  Occupational History   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  Tobacco Use   Smoking status: Never    Passive exposure: Past   Smokeless tobacco: Never  Substance and Sexual Activity   Alcohol use: Yes    Alcohol/week: 3.0 standard drinks of alcohol    Types: 3 Glasses of wine per week    Comment: 3 PER WEEK   Drug use: No   Sexual activity: Never     Birth control/protection: Surgical  Other Topics Concern   Not on file  Social History Narrative   No living will or health care POA   Would want son to make decisions   Would accept resuscitation but no prolonged ventilation   No tube feeds if cognitively unaware   Social Determinants of Health   Financial Resource Strain: Low Risk  (03/26/2022)   Received from [facility], [facility]   Overall Financial Resource Strain (CARDIA)    Difficulty of Paying Living Expenses: Not hard at all  Food Insecurity: No Food Insecurity (03/26/2022)   Hunger Vital Sign    Worried About Running Out of Food in the Last Year: Never true    Ran Out of Food in the Last Year: Never true  Transportation Needs: No Transportation Needs (03/26/2022)   PRAPARE - Administrator, Civil Service (Medical): No    Lack of Transportation (Non-Medical): No  Physical Activity: Not on file  Stress: Not on file  Social Connections: Not on file  Intimate Partner Violence: Not on file   Review of Systems Appetite is good Weight is down some from last year Wears seat belt Teeth fine--keeps up with dentist Bowels are slow---uses calm (magnesium). No blood No dysuria or hematuria. Rare leakage Still with back pain from spinal stenosis---uses tramadol rarely. Recent injection by Dr Yves Dill    Objective:   Physical Exam Constitutional:      Appearance: Normal appearance.  HENT:     Mouth/Throat:     Pharynx: No oropharyngeal exudate or posterior oropharyngeal erythema.  Eyes:     Conjunctiva/sclera: Conjunctivae normal.     Pupils: Pupils are equal, round, and reactive to light.  Cardiovascular:     Rate and Rhythm: Normal rate and regular rhythm.     Pulses: Normal pulses.     Heart sounds: No murmur heard.    No gallop.  Pulmonary:     Effort: Pulmonary effort is normal.     Breath sounds: Normal breath sounds. No wheezing or rales.  Abdominal:      Palpations: Abdomen is soft.     Tenderness: There is no abdominal tenderness.  Musculoskeletal:     Cervical back: Neck supple.     Right lower leg: No edema.     Left lower leg: No edema.  Lymphadenopathy:     Cervical: No cervical adenopathy.  Skin:    Findings: No rash.  Neurological:     General: No focal deficit present.     Mental Status: She is alert and oriented to person, place, and time.  Comments: Word naming 12/1 minute Recall 3/3  Psychiatric:        Mood and Affect: Mood normal.        Behavior: Behavior normal.            Assessment & Plan:

## 2022-12-16 NOTE — Assessment & Plan Note (Signed)
Takes gabapentin 100 at bedtime

## 2022-12-16 NOTE — Assessment & Plan Note (Signed)
Gets regular checks at [facility] off on Rx for now

## 2022-12-16 NOTE — Assessment & Plan Note (Signed)
Long standing mild elevation ---last years ago was better Will recheck

## 2022-12-16 NOTE — Progress Notes (Signed)
 Hearing Screening - Comments:: Passed whisper test Vision Screening - Comments:: July 2024

## 2022-12-16 NOTE — Assessment & Plan Note (Signed)
Mostly quiet on potassium citrate bid

## 2022-12-16 NOTE — Assessment & Plan Note (Signed)
I have personally reviewed the Medicare Annual Wellness questionnaire and have noted 1. The patient's medical and social history 2. Their use of alcohol, tobacco or illicit drugs 3. Their current medications and supplements 4. The patient's functional ability including ADL's, fall risks, home safety risks and hearing or visual             impairment. 5. Diet and physical activities 6. Evidence for depression or mood disorders  The patients weight, height, BMI and visual acuity have been recorded in the chart I have made referrals, counseling and provided education to the patient based review of the above and I have provided the pt with a written personalized care plan for preventive services.  I have provided you with a copy of your personalized plan for preventive services. Please take the time to review along with your updated medication list.  Colonoscopy scheduled next month Recent mammogram--yearly till at least 74 Discussed increasing exercise Update COVID and flu vaccines this fall Td/RSV at pharmacy No pap due to age

## 2022-12-16 NOTE — Assessment & Plan Note (Signed)
BP Readings from Last 3 Encounters:  12/16/22 122/80  04/08/22 108/64  12/13/21 138/82   Good control on hydrochlorothiazide 25 and losartan 100 Recent labs at [facility] normal

## 2022-12-16 NOTE — Assessment & Plan Note (Signed)
Does fairly well with the bupropion 200mg  bid Trazodone 50 for sleep

## 2022-12-24 ENCOUNTER — Telehealth: Payer: Self-pay | Admitting: Internal Medicine

## 2022-12-24 NOTE — Telephone Encounter (Signed)
Patient called in and stated that she spoke with Carollee Herter regarding a new pharmacy. She was wanting to know if Carollee Herter could call her with the pharmacy name. Thank you!

## 2022-12-24 NOTE — Telephone Encounter (Signed)
Left message for pt to call back  °

## 2022-12-25 NOTE — Telephone Encounter (Signed)
Spoke to pt. Advised her it was [facility].

## 2022-12-31 NOTE — Telephone Encounter (Signed)
Patient called in and updated her pharmacy to [facility] and would like all her medications to be sent over to them. Thank you!

## 2023-01-01 NOTE — Telephone Encounter (Signed)
Left another message for pt asking her to provide me with exactly which medications she is needing sent to [facility]. We do not do a blanket ALL refills.

## 2023-01-02 ENCOUNTER — Ambulatory Visit (AMBULATORY_SURGERY_CENTER): Payer: Medicare Other | Admitting: *Deleted

## 2023-01-02 VITALS — Ht 60.1 in | Wt 164.0 lb

## 2023-01-02 DIAGNOSIS — Z86010 Personal history of colonic polyps: Secondary | ICD-10-CM

## 2023-01-02 MED ORDER — LOSARTAN POTASSIUM 100 MG PO TABS: ORAL_TABLET | ORAL | 3 refills | Status: DC

## 2023-01-02 MED ORDER — NA SULFATE-K SULFATE-MG SULF 17.5-3.13-1.6 GM/177ML PO SOLN
1.0000 | Freq: Once | ORAL | 0 refills | Status: AC
Start: 2023-01-02 — End: 2023-01-02

## 2023-01-02 MED ORDER — HYDROCHLOROTHIAZIDE 25 MG PO TABS: ORAL_TABLET | ORAL | 3 refills | Status: DC

## 2023-01-02 NOTE — Addendum Note (Signed)
Addended by: Eual Fines on: 01/02/2023 09:27 AM   Modules accepted: Orders

## 2023-01-02 NOTE — Progress Notes (Signed)
Pt's name and DOB verified at the beginning of the pre-visit.  Pt denies any difficulty with ambulating,sitting, laying down or rolling side to side Gave both [facility] main # and MD on call # prior to instructions.  No egg or soy allergy known to patient  No issues known to pt with past sedation with any surgeries or procedures Pt denies having issues being intubated Pt has no issues moving head neck or swallowing No FH of Malignant Hyperthermia Pt is not on diet pills Pt is not on home 02  Pt is not on blood thinners  Pt has frequent issues with constipation RN instructed pt to use Miralax per bottles instructions a week before prep days. Pt states they will Pt is not on dialysis Pt denise any abnormal heart rhythms  Pt denies any upcoming cardiac testing Pt encouraged to use to use Singlecare or Goodrx to reduce cost  Patient's chart reviewed by Cathlyn Parsons CNRA prior to pre-visit and patient appropriate for the [facility].  Pre-visit completed and red dot placed by patient's name on their procedure day (on provider's schedule).  . Visit by phone Pt states weight is 164 lb Instructed pt why it is important to and  to call if they have any changes in health or new medications. Directed them to the # given and on instructions.   Pt states they will.  Instructions reviewed with pt and pt states understanding. Instructed to review again prior to procedure. Pt states they will.  Instructions sent by mail with coupon and by my chart

## 2023-01-02 NOTE — Telephone Encounter (Signed)
Spoke to pt. She told me what she needed right now. Sent losartan and hydrochlorothiazide to [facility].

## 2023-01-20 ENCOUNTER — Ambulatory Visit (AMBULATORY_SURGERY_CENTER): Payer: Medicare Other | Admitting: Gastroenterology

## 2023-01-20 ENCOUNTER — Encounter: Payer: Self-pay | Admitting: Gastroenterology

## 2023-01-20 VITALS — BP 115/62 | HR 72 | Temp 97.3°F | Resp 13 | Ht 61.0 in | Wt 164.0 lb

## 2023-01-20 DIAGNOSIS — Z86010 Personal history of colonic polyps: Secondary | ICD-10-CM | POA: Diagnosis not present

## 2023-01-20 DIAGNOSIS — Z09 Encounter for follow-up examination after completed treatment for conditions other than malignant neoplasm: Secondary | ICD-10-CM | POA: Diagnosis not present

## 2023-01-20 DIAGNOSIS — D123 Benign neoplasm of transverse colon: Secondary | ICD-10-CM | POA: Diagnosis not present

## 2023-01-20 MED ORDER — SODIUM CHLORIDE 0.9 % IV SOLN: 500.0000 mL | Freq: Once | INTRAVENOUS | Status: DC

## 2023-01-20 NOTE — Progress Notes (Signed)
Called to room to assist during endoscopic procedure.  Patient ID and intended procedure confirmed with present staff. Received instructions for my participation in the procedure from the performing physician.  

## 2023-01-20 NOTE — Progress Notes (Signed)
Vss nad trans to pacu 

## 2023-01-20 NOTE — Patient Instructions (Signed)
Resume all of your previous medications as ordered.  Read all of the handouts given to you  by your recovery room nurse.  YOU HAD AN ENDOSCOPIC PROCEDURE TODAY AT THE [facility]:   Refer to the procedure report that was given to you for any specific questions about what was found during the examination.  If the procedure report does not answer your questions, please call your gastroenterologist to clarify.  If you requested that your care partner not be given the details of your procedure findings, then the procedure report has been included in a sealed envelope for you to review at your convenience later.  YOU SHOULD EXPECT: Some feelings of bloating in the abdomen. Passage of more gas than usual.  Walking can help get rid of the air that was put into your GI tract during the procedure and reduce the bloating. If you had a lower endoscopy (such as a colonoscopy or flexible sigmoidoscopy) you may notice spotting of blood in your stool or on the toilet paper. If you underwent a bowel prep for your procedure, you may not have a normal bowel movement for a few days.  Please Note:  You might notice some irritation and congestion in your nose or some drainage.  This is from the oxygen used during your procedure.  There is no need for concern and it should clear up in a day or so.  SYMPTOMS TO REPORT IMMEDIATELY:  Following lower endoscopy (colonoscopy or flexible sigmoidoscopy):  Excessive amounts of blood in the stool  Significant tenderness or worsening of abdominal pains  Swelling of the abdomen that is new, acute  Fever of 100F or higher   For urgent or emergent issues, a gastroenterologist can be reached at any hour by calling [phone]. Do not use MyChart messaging for urgent concerns.    DIET:  We do recommend a small meal at first, but then you may proceed to your regular diet.  Drink plenty of fluids but you should avoid alcoholic beverages for 24 hours. Try to eat a  high fiber diet, and drink plenty of water.  ACTIVITY:  You should plan to take it easy for the rest of today and you should NOT DRIVE or use heavy machinery until tomorrow (because of the sedation medicines used during the test).    FOLLOW UP: Our staff will call the number listed on your records the next business day following your procedure.  We will call around 7:15- 8:00 am to check on you and address any questions or concerns that you may have regarding the information given to you following your procedure. If we do not reach you, we will leave a message.     If any biopsies were taken you will be contacted by phone or by letter within the next 1-3 weeks.  Please call us at [phone] if you have not heard about the biopsies in 3 weeks.    SIGNATURES/CONFIDENTIALITY: You and/or your care partner have signed paperwork which will be entered into your electronic medical record.  These signatures attest to the fact that that the information above on your After Visit Summary has been reviewed and is understood.  Full responsibility of the confidentiality of this discharge information lies with you and/or your care-partner.

## 2023-01-20 NOTE — Progress Notes (Signed)
Pt's states no medical or surgical changes since previsit or office visit. 

## 2023-01-20 NOTE — Op Note (Signed)
[facility] Patient Name: Regina Chapman Procedure Date: 01/20/2023 10:22 AM MRN: [id number] Endoscopist: Sherilyn Cooter L. Myrtie Neither , MD, [id number] Age: 72 Referring MD:  Date of Birth: Jan 21, 1951 Gender: Female Account #: [id number] Procedure:                Colonoscopy Indications:              Surveillance: Personal history of adenomatous                            polyps on last colonoscopy > 5 years ago Medicines:                Monitored Anesthesia Care Procedure:                Pre-Anesthesia Assessment:                           - Prior to the procedure, a History and Physical                            was performed, and patient medications and                            allergies were reviewed. The patient's tolerance of                            previous anesthesia was also reviewed. The risks                            and benefits of the procedure and the sedation                            options and risks were discussed with the patient.                            All questions were answered, and informed consent                            was obtained. Prior Anticoagulants: The patient has                            taken no anticoagulant or antiplatelet agents. ASA                            Grade Assessment: II - A patient with mild systemic                            disease. After reviewing the risks and benefits,                            the patient was deemed in satisfactory condition to                            undergo the procedure.  After obtaining informed consent, the colonoscope                            was passed under direct vision. Throughout the                            procedure, the patient's blood pressure, pulse, and                            oxygen saturations were monitored continuously. The                            CF [id number] #[id number] was introduced through the anus                            and advanced to  the the cecum, identified by                            appendiceal orifice and ileocecal valve. The                            colonoscopy was performed without difficulty. The                            patient tolerated the procedure well. The quality                            of the bowel preparation was good. The ileocecal                            valve, appendiceal orifice, and rectum were                            photographed. The bowel preparation used was SUPREP. Scope In: 10:32:39 AM Scope Out: 10:48:56 AM Scope Withdrawal Time: 0 hours 12 minutes 28 seconds  Total Procedure Duration: 0 hours 16 minutes 17 seconds  Findings:                 The perianal and digital rectal examinations were                            normal.                           Repeat examination of right colon under NBI                            performed.                           A diminutive polyp was found in the proximal                            transverse colon. The polyp was sessile. The polyp  was removed with a cold snare. Resection and                            retrieval were complete.                           A few diverticula were found in the descending                            colon.                           The exam was otherwise without abnormality on                            direct and retroflexion views. Complications:            No immediate complications. Estimated Blood Loss:     Estimated blood loss was minimal. Impression:               - One diminutive polyp in the proximal transverse                            colon, removed with a cold snare. Resected and                            retrieved.                           - Diverticulosis in the descending colon.                           - The examination was otherwise normal on direct                            and retroflexion views. Recommendation:           - Patient has a contact  number available for                            emergencies. The signs and symptoms of potential                            delayed complications were discussed with the                            patient. Return to normal activities tomorrow.                            Written discharge instructions were provided to the                            patient.                           - Resume previous diet.                           -  Continue present medications.                           - Await pathology results.                           - No repeat colonoscopy due to age, current                            guidelines and low risk findings today. Carolan Avedisian L. Myrtie Neither, MD 01/20/2023 10:56:07 AM This report has been signed electronically.

## 2023-01-20 NOTE — Progress Notes (Signed)
History and Physical:  This patient presents for endoscopic testing for: Encounter Diagnosis  Name Primary?   Personal history of colonic polyps Yes    Surveillance colonoscopy today Diminutive TA Aug 2017 Patient denies chronic abdominal pain or rectal bleeding.  Tends toward constipation, treats with diet and OTC meds.   Patient is otherwise without complaints or active issues today.   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   Cataract    Depression    History of melanoma 1985   Hyperlipidemia    Hypertension    Kidney stones    Litho does not work on her per pt   Neuromuscular disorder (HCC)    Neuropathy   Obesity    Obesity    Psoriasis    Sciatica of left side 2020   Tumors    In lungs small being monitored     Past Surgical History: Past Surgical History:  Procedure Laterality Date   ABDOMINAL HYSTERECTOMY     CATARACT EXTRACTION W/ INTRAOCULAR LENS  IMPLANT, BILATERAL  2013   LAPAROSCOPIC ASSISTED VAGINAL HYSTERECTOMY  1987   FOR ENDOMETRIOSIS   LUNG REMOVAL, PARTIAL     MELANOMA EXCISION  1985   OOPHORECTOMY      Allergies: Allergies  Allergen Reactions   Augmentin [Amoxicillin-Pot Clavulanate] Nausea And Vomiting    Able to tolerate Amoxil   Cortisone Other (See Comments)    Pt had depomedrol inj and next day had red face,puffiness in face and slight swelling in face; no SOB or swelling in mouth,tongue or throat.  Pt tolerates topical steroid cream.    Outpatient Meds: Current Outpatient Medications  Medication Sig Dispense Refill   b complex vitamins capsule Take 1 capsule by mouth daily.     Biotin 16109 MCG TABS Take 1 tablet by mouth daily.     buPROPion (WELLBUTRIN SR) 200 MG 12 hr tablet TAKE ONE TABLET BY MOUTH TWICE DAILY 180 tablet 3   cholecalciferol (VITAMIN D) 1000 units tablet Take 1,000 Units by mouth daily.     gabapentin  (NEURONTIN) 100 MG capsule Take 100 mg by mouth at bedtime.     hydrochlorothiazide (HYDRODIURIL) 25 MG tablet TAKE ONE TABLET BY MOUTH ONCE DAILY 90 tablet 3   losartan (COZAAR) 100 MG tablet TAKE ONE TABLET BY MOUTH ONCE DAILY 90 tablet 3   Omega 3 1000 MG CAPS Take 2 capsules by mouth daily.     potassium citrate (UROCIT-K) 10 MEQ (1080 MG) SR tablet Take 20 mEq by mouth 2 (two) times daily.     traMADol (ULTRAM) 50 MG tablet Take 50 mg by mouth 2 (two) times daily as needed.     traZODone (DESYREL) 50 MG tablet TAKE 1 TO 2 TABLETS BY MOUTH EVERYDAY AT BEDTIME AS NEEDED 180 tablet 3   Current Facility-Administered Medications  Medication Dose Route Frequency Provider Last Rate Last Admin   0.9 %  sodium chloride infusion  500 mL Intravenous Once Danis, Andreas Blower, MD          ___________________________________________________________________ Objective   Exam:  BP (!) 155/78   Pulse 77   Temp (!) 97.3 F (36.3 C) (Temporal)   Ht 5\' 1"  (1.549 m)   Wt 164 lb (74.4 kg)   SpO2 95%   BMI 30.99 kg/m   CV: regular , S1/S2 Resp: clear to auscultation  bilaterally, normal RR and effort noted GI: soft, no tenderness, with active bowel sounds.   Assessment: Encounter Diagnosis  Name Primary?   Personal history of colonic polyps Yes     Plan: Colonoscopy   The benefits and risks of the planned procedure were described in detail with the patient or (when appropriate) their health care proxy.  Risks were outlined as including, but not limited to, bleeding, infection, perforation, adverse medication reaction leading to cardiac or pulmonary decompensation, pancreatitis (if ERCP).  The limitation of incomplete mucosal visualization was also discussed.  No guarantees or warranties were given.  The patient is appropriate for an endoscopic procedure in the ambulatory setting.   - Amada Jupiter, MD

## 2023-01-21 ENCOUNTER — Telehealth: Payer: Self-pay

## 2023-01-21 NOTE — Telephone Encounter (Signed)
Left message on follow up call. 

## 2023-01-22 ENCOUNTER — Encounter: Payer: Self-pay | Admitting: Gastroenterology

## 2023-01-22 LAB — SURGICAL PATHOLOGY

## 2023-02-16 ENCOUNTER — Other Ambulatory Visit: Payer: Self-pay

## 2023-02-16 ENCOUNTER — Encounter: Payer: Self-pay | Admitting: Internal Medicine

## 2023-02-16 MED ORDER — BUPROPION HCL ER (SR) 200 MG PO TB12: 200.0000 mg | ORAL_TABLET | Freq: Two times a day (BID) | ORAL | 3 refills | Status: DC

## 2023-04-07 ENCOUNTER — Other Ambulatory Visit (HOSPITAL_COMMUNITY): Payer: Self-pay

## 2023-04-07 ENCOUNTER — Other Ambulatory Visit: Payer: Self-pay | Admitting: Internal Medicine

## 2023-04-07 MED ORDER — GABAPENTIN 100 MG PO CAPS
100.0000 mg | ORAL_CAPSULE | Freq: Every day | ORAL | 3 refills | Status: DC
  Filled 2023-04-07: qty 90, 90d supply, fill #0

## 2023-06-04 ENCOUNTER — Other Ambulatory Visit: Payer: Self-pay | Admitting: Internal Medicine

## 2023-06-04 MED ORDER — TRAZODONE HCL 50 MG PO TABS: ORAL_TABLET | ORAL | 3 refills | Status: AC

## 2023-06-04 NOTE — Telephone Encounter (Signed)
 Copied from CRM [id number]. Topic: Clinical - Medication Refill >> Jun 04, 2023  3:01 PM Viola FALCON wrote: Most Recent Primary Care Visit:  Provider: JIMMY ADE I  Department: [facility]  Visit Type: PHYSICAL  Date: 12/16/2022  Medication: TraZODone    Has the patient contacted their pharmacy? Yes (Agent: If no, request that the patient contact the pharmacy for the refill. If patient does not wish to contact the pharmacy document the reason why and proceed with request.) (Agent: If yes, when and what did the pharmacy advise?)  Is this the correct pharmacy for this prescription? Yes If no, delete pharmacy and type the correct one.  This is the patient's preferred pharmacy:   [id number] - [place], [place] - [address] AT [facility] OF [address] [place] [address] [place] [address] Phone: [phone] Fax: [phone]   Has the prescription been filled recently? Yes  Is the patient out of the medication? Yes  Has the patient been seen for an appointment in the last year OR does the patient have an upcoming appointment? Yes  Can we respond through MyChart? No  Agent: Please be advised that Rx refills may take up to 3 business days. We ask that you follow-up with your pharmacy.

## 2023-12-17 ENCOUNTER — Ambulatory Visit

## 2023-12-17 VITALS — Ht 62.0 in | Wt 164.0 lb

## 2023-12-17 DIAGNOSIS — Z0000 Encounter for general adult medical examination without abnormal findings: Secondary | ICD-10-CM

## 2023-12-17 NOTE — Progress Notes (Signed)
 Subjective:   Regina Chapman is a 73 y.o. who presents for a Medicare Wellness preventive visit.  As a reminder, Annual Wellness Visits don't include a physical exam, and some assessments may be limited, especially if this visit is performed virtually. We may recommend an in-person follow-up visit with your provider if needed.  Visit Complete: Virtual I connected with  Regina Chapman on 12/17/23 by a audio enabled telemedicine application and verified that I am speaking with the correct person using two identifiers.  Patient Location: Home  Provider Location: Office/Clinic  I discussed the limitations of evaluation and management by telemedicine. The patient expressed understanding and agreed to proceed.  Vital Signs: Because this visit was a virtual/telehealth visit, some criteria may be missing or patient reported. Any vitals not documented were not able to be obtained and vitals that have been documented are patient reported.  VideoDeclined- This patient declined Librarian, academic. Therefore the visit was completed with audio only.  Persons Participating in Visit: Patient.  AWV Questionnaire: Yes: Patient Medicare AWV questionnaire was completed by the patient on 12/16/23; I have confirmed that all information answered by patient is correct and no changes since this date.  Cardiac Risk Factors include: advanced age (>6men, >52 women);obesity (BMI >30kg/m2);hypertension;dyslipidemia;sedentary lifestyle     Objective:    Today's Vitals   12/17/23 1130  Weight: 164 lb (74.4 kg)  Height: 5' 2 (1.575 m)   Body mass index is 30 kg/m.     12/17/2023   11:36 AM 09/26/2016    9:46 AM 12/26/2015    7:41 AM 12/12/2015    8:51 AM  Advanced Directives  Does Patient Have a Medical Advance Directive? Yes No  No  No   Type of Estate agent of [place];Living will     Copy of Healthcare Power of Attorney in Chart? No - copy requested      Would patient like information on creating a medical advance directive?  Yes (MAU/Ambulatory/Procedural Areas - Information given)        Data saved with a previous flowsheet row definition    Current Medications (verified) Outpatient Encounter Medications as of 12/17/2023  Medication Sig   b complex vitamins capsule Take 1 capsule by mouth daily.   Biotin 89999 MCG TABS Take 1 tablet by mouth daily.   buPROPion  (WELLBUTRIN  SR) 200 MG 12 hr tablet Take 1 tablet (200 mg total) by mouth 2 (two) times daily.   cholecalciferol (VITAMIN D) 1000 units tablet Take 1,000 Units by mouth daily.   gabapentin  (NEURONTIN ) 100 MG capsule Take 1 capsule (100 mg total) by mouth at bedtime.   hydrochlorothiazide  (HYDRODIURIL ) 25 MG tablet TAKE ONE TABLET BY MOUTH ONCE DAILY   losartan  (COZAAR ) 100 MG tablet TAKE ONE TABLET BY MOUTH ONCE DAILY   Omega 3 1000 MG CAPS Take 2 capsules by mouth daily.   potassium citrate (UROCIT-K) 10 MEQ (1080 MG) SR tablet Take 20 mEq by mouth 2 (two) times daily.   traZODone  (DESYREL ) 50 MG tablet TAKE 1 TO 2 TABLETS BY MOUTH EVERYDAY AT BEDTIME AS NEEDED   traMADol (ULTRAM) 50 MG tablet Take 50 mg by mouth 2 (two) times daily as needed.   No facility-administered encounter medications on file as of 12/17/2023.    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   Cataract    Depression    History of melanoma 1985   Hyperlipidemia    Hypertension    Kidney stones    Litho does not work on her per pt   Neuromuscular disorder (HCC)    Neuropathy   Obesity    Obesity    Psoriasis    Sciatica of left side 2020   Tumors    In lungs small being monitored   Past Surgical History:  Procedure Laterality Date   ABDOMINAL HYSTERECTOMY     APPENDECTOMY     CATARACT EXTRACTION W/ INTRAOCULAR LENS  IMPLANT,  BILATERAL  2013   LAPAROSCOPIC ASSISTED VAGINAL HYSTERECTOMY  1987   FOR ENDOMETRIOSIS   LUNG REMOVAL, PARTIAL     MELANOMA EXCISION  1985   OOPHORECTOMY     Family History  Problem Relation Age of Onset   Heart disease Mother    Dementia Mother    Cervical cancer Sister        CERVICAL    Breast cancer Other    Diabetes Neg Hx    Hypertension Neg Hx    Colon cancer Neg Hx    Colon polyps Neg Hx    Esophageal cancer Neg Hx    Stomach cancer Neg Hx    Rectal cancer Neg Hx    Social History   Socioeconomic History   Marital status: Widowed    Spouse name: Not on file   Number of children: 1   Years of education: Not on file   Highest education level: Not on file  Occupational History   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  Tobacco Use   Smoking status: Never    Passive exposure: Past   Smokeless tobacco: Never  Substance and Sexual Activity   Alcohol use: Yes    Alcohol/week: 2.0 standard drinks of alcohol    Types: 1 Glasses of wine, 1 Cans of beer per week    Comment: 1 PER WEEK   Drug use: Never   Sexual activity: Not Currently    Birth control/protection: Surgical  Other Topics Concern   Not on file  Social History Narrative   No living will or health care POA   Would want son to make decisions   Would accept resuscitation but no prolonged ventilation   No tube feeds if cognitively unaware   Social Drivers of Health   Financial Resource Strain: Low Risk  (12/16/2023)   Overall Financial Resource Strain (CARDIA)    Difficulty of Paying Living Expenses: Not very hard  Food Insecurity: Food Insecurity Present (12/16/2023)   Hunger Vital Sign    Worried About Running Out of Food in the Last Year: Sometimes true    Ran Out of Food in the Last Year: Never true  Transportation Needs: Unmet Transportation Needs (12/16/2023)   PRAPARE - Administrator, Civil Service (Medical): Yes    Lack of Transportation  (Non-Medical): Not on file  Physical Activity: Insufficiently Active (12/16/2023)   Exercise Vital Sign    Days of Exercise per Week: 2 days    Minutes of Exercise per Session: 20 min  Stress: Stress Concern Present (12/16/2023)   [employer] of Occupational Health - Occupational Stress Questionnaire    Feeling of Stress: To some extent  Social Connections: Moderately Isolated (12/16/2023)   Social Connection and Isolation Panel    Frequency of Communication with Friends and Family: Twice a week  Frequency of Social Gatherings with Friends and Family: Once a week    Attends Religious Services: 1 to 4 times per year    Active Member of [employer] or Organizations: No    Attends Banker Meetings: Not on file    Marital Status: Widowed    Tobacco Counseling Counseling given: Not Answered   Clinical Intake:  Pre-visit preparation completed: Yes  Pain : No/denies pain    BMI - recorded: 30 Nutritional Status: BMI > 30  Obese Nutritional Risks: None Diabetes: No  No results found for: HGBA1C   How often do you need to have someone help you when you read instructions, pamphlets, or other written materials from your doctor or pharmacy?: 1 - Never  Interpreter Needed?: No  Comments: lives alone Information entered by :: B.Tiffony Kite,LPN   Activities of Daily Living     12/16/2023    2:41 PM  In your present state of health, do you have any difficulty performing the following activities:  Hearing? 0  Vision? 0  Difficulty concentrating or making decisions? 0  Walking or climbing stairs? 0  Dressing or bathing? 0  Doing errands, shopping? 0  Preparing Food and eating ? N  Using the Toilet? N  In the past six months, have you accidently leaked urine? N  Do you have problems with loss of bowel control? N  Managing your Medications? N  Managing your Finances? N  Housekeeping or managing your Housekeeping? N    Patient Care Team: Jimmy Charlie FERNS, MD as  PCP - General Myra Rosaline FALCON, [facility] (Inactive) as Pharmacist (Pharmacist) Pll[facility], [facility]  I have updated your Care Teams any recent Medical Services you may have received from other providers in the past year.     Assessment:   This is a routine wellness examination for Regina Chapman.  Hearing/Vision screen Hearing Screening - Comments:: Patient denies any hearing difficulties.   Vision Screening - Comments:: Pt says their vision is good with glasses (for night driving) Dr  Mevelyn   Goals Addressed             This Visit's Progress    Patient Stated       Pt says she would like to lose 20lb by decrease food       Depression Screen     12/17/2023   11:35 AM 12/16/2022   10:32 AM 12/16/2022   10:07 AM 12/13/2021   10:21 AM 04/01/2021    9:39 AM 12/12/2020   11:22 AM 12/09/2019   10:16 AM  PHQ 2/9 Scores  PHQ - 2 Score 0 1 0  0    PHQ- 9 Score   0  0    Exception Documentation    Medical reason  Medical reason Medical reason    Fall Risk     12/16/2023    2:41 PM 12/16/2022   10:32 AM 12/16/2022   10:07 AM 12/13/2021   10:20 AM 12/12/2020   11:22 AM  Fall Risk   Falls in the past year? 0 0 0 0 0  Number falls in past yr:   0    Injury with Fall?   0    Risk for fall due to : No Fall Risks  No Fall Risks    Follow up Education provided;Falls prevention discussed  Falls evaluation completed      MEDICARE RISK AT HOME:  Medicare Risk at [facility] free of loose throw rugs in walkways, pet beds,  electrical cords, etc?: (Patient-Rptd) Yes Adequate lighting in your home to reduce risk of falls?: (Patient-Rptd) Yes Life alert?: (Patient-Rptd) No Use of a cane, walker or w/c?: (Patient-Rptd) No Grab bars in the bathroom?: (Patient-Rptd) Yes Shower chair or bench in shower?: (Patient-Rptd) No Elevated toilet seat or a handicapped toilet?: (Patient-Rptd) Yes  TIMED UP AND GO:  Was the test performed?  No  Cognitive Function: 6CIT completed        12/17/2023    11:38 AM  6CIT Screen  What Year? 0 points  What month? 0 points  What time? 0 points  Count back from 20 0 points  Months in reverse 0 points  Repeat phrase 0 points  Total Score 0 points    Immunizations Immunization History  Administered Date(s) Administered   Influenza, High Dose Seasonal PF 04/29/2016, 03/03/2017, 01/06/2019   Influenza, Seasonal, Injecte, Preservative Fre 01/23/2015   Influenza,inj,Quad PF,6+ Mos 02/09/2013, 01/20/2014   Influenza-Unspecified 02/11/2012, 02/22/2018, 02/07/2020   PFIZER(Purple Top)SARS-COV-2 Vaccination 06/22/2019, 07/19/2019, 07/07/2020   Pneumococcal Conjugate-13 07/20/2015   Pneumococcal Polysaccharide-23 09/26/2016   Tdap 02/09/2013    Screening Tests Health Maintenance  Topic Date Due   Zoster Vaccines- Shingrix (1 of 2) Never done   DTaP/Tdap/Td (2 - Td or Tdap) 02/10/2023   INFLUENZA VACCINE  11/27/2023   MAMMOGRAM  12/10/2024   Medicare Annual Wellness (AWV)  12/16/2024   Pneumococcal Vaccine: 50+ Years  Completed   DEXA SCAN  Completed   HPV VACCINES  Aged Out   Meningococcal B Vaccine  Aged Out   Colonoscopy  Discontinued   COVID-19 Vaccine  Discontinued   Hepatitis C Screening  Discontinued    Health Maintenance  Health Maintenance Due  Topic Date Due   Zoster Vaccines- Shingrix (1 of 2) Never done   DTaP/Tdap/Td (2 - Td or Tdap) 02/10/2023   INFLUENZA VACCINE  11/27/2023   Health Maintenance Items Addressed:   Additional Screening:  Vision Screening: Recommended annual ophthalmology exams for early detection of glaucoma and other disorders of the eye. Would you like a referral to an eye doctor? No    Dental Screening: Recommended annual dental exams for proper oral hygiene  Community Resource Referral / Chronic Care Management: CRR required this visit?  No   CCM required this visit?  No   Plan:    I have personally reviewed and noted the following in the patient's chart:   Medical and social  history Use of alcohol, tobacco or illicit drugs  Current medications and supplements including opioid prescriptions. Patient is not currently taking opioid prescriptions. Functional ability and status Nutritional status Physical activity Advanced directives List of other physicians Hospitalizations, surgeries, and ER visits in previous 12 months Vitals Screenings to include cognitive, depression, and falls Referrals and appointments  In addition, I have reviewed and discussed with patient certain preventive protocols, quality metrics, and best practice recommendations. A written personalized care plan for preventive services as well as general preventive health recommendations were provided to patient.   Erminio LITTIE Saris, LPN   1/78/7974   After Visit Summary: (MyChart) Due to this being a telephonic visit, the after visit summary with patients personalized plan was offered to patient via MyChart   Notes: Nothing significant to report at this time.

## 2023-12-17 NOTE — Patient Instructions (Addendum)
 Ms. Regina Chapman , Thank you for taking time out of your busy schedule to complete your Annual Wellness Visit with me. I enjoyed our conversation and look forward to speaking with you again next year. I, as well as your care team,  appreciate your ongoing commitment to your health goals. Please review the following plan we discussed and let me know if I can assist you in the future. Your Game plan/ To Do List    Referrals: If you haven't heard from the office you've been referred to, please reach out to them at the phone provided.   Follow up Visits: We will see or speak with you next year for your Next Medicare AWV with our clinical staff-12/20/24 @ 10:50am Have you seen your provider in the last 6 months (3 months if uncontrolled diabetes)?   Clinician Recommendations:  Aim for 30 minutes of exercise or brisk walking, 6-8 glasses of water, and 5 servings of fruits and vegetables each day.       This is a list of the screenings recommended for you:  Health Maintenance  Topic Date Due   Zoster (Shingles) Vaccine (1 of 2) Never done   DTaP/Tdap/Td vaccine (2 - Td or Tdap) 02/10/2023   Flu Shot  11/27/2023   Mammogram  12/10/2024   Medicare Annual Wellness Visit  12/16/2024   Pneumococcal Vaccine for age over 50  Completed   DEXA scan (bone density measurement)  Completed   HPV Vaccine  Aged Out   Meningitis B Vaccine  Aged Out   Colon Cancer Screening  Discontinued   COVID-19 Vaccine  Discontinued   Hepatitis C Screening  Discontinued    Advanced directives: (Copy Requested) Please bring a copy of your health care power of attorney and living will to the office to be added to your chart at your convenience. You can mail to [facility] [address] [place], [place] [address] or email to [email or web address] Advance Care Planning is important because it:  [x]  Makes sure you receive the medical care that is consistent with your values, goals, and preferences  [x]  It  provides guidance to your family and loved ones and reduces their decisional burden about whether or not they are making the right decisions based on your wishes.  Follow the link provided in your after visit summary or read over the paperwork we have mailed to you to help you started getting your Advance Directives in place. If you need assistance in completing these, please reach out to us  so that we can help you!

## 2023-12-18 ENCOUNTER — Ambulatory Visit: Payer: Medicare Other | Admitting: Internal Medicine

## 2023-12-18 ENCOUNTER — Telehealth: Payer: Self-pay

## 2023-12-18 ENCOUNTER — Encounter: Payer: Self-pay | Admitting: Internal Medicine

## 2023-12-18 VITALS — BP 130/84 | HR 88 | Temp 99.5°F | Ht 62.0 in | Wt 176.0 lb

## 2023-12-18 DIAGNOSIS — I10 Essential (primary) hypertension: Secondary | ICD-10-CM

## 2023-12-18 DIAGNOSIS — F3341 Major depressive disorder, recurrent, in partial remission: Secondary | ICD-10-CM

## 2023-12-18 DIAGNOSIS — G629 Polyneuropathy, unspecified: Secondary | ICD-10-CM | POA: Diagnosis not present

## 2023-12-18 DIAGNOSIS — C7B00 Secondary carcinoid tumors, unspecified site: Secondary | ICD-10-CM

## 2023-12-18 NOTE — Progress Notes (Signed)
 Can you please call and set the patient up in 6 months for transfer of care appointment

## 2023-12-18 NOTE — Assessment & Plan Note (Signed)
 Mostly now having reactive anxiety with caring for mother in law Depression mostly controlled with the bupropion  200 bid Sleeps okay with trazodone 

## 2023-12-18 NOTE — Telephone Encounter (Signed)
 Per Angeline,    Can you please call and set the patient up in 6 months for transfer of care appointment    LM for patient to call back. Okay to schedule if patient calls back.

## 2023-12-18 NOTE — Assessment & Plan Note (Signed)
 Continues her follow up at [facility]

## 2023-12-18 NOTE — Progress Notes (Signed)
   Subjective:    Patient ID: Regina Chapman, female    DOB: [birth year], 73 y.o.   MRN: [id number]  HPI Here for follow up of chronic medical conditions  Having a lot of sinus drainage Starts in evening and goes through the night Slight cough--just to bring the stuff up Tried claritin  and a couple others Hasn't tried nasal spray Has old pillow Has replaced filters --every other month  Has oncology follow up at [facility] in [place] No treatment now Keeps up with urology as well  Having trouble with sciatica also Has restarted exercise---chair exercise program on Facebook  Has noted anxiety lately--still stress caring for mother in law in [place] Has to be down there even more often Stress using the caption phone with her--since she can't hear Depression not as noticeable  Review of Systems Appetite is okay Weight stable Sleeps okay with trazodone  Bowels move okay Back/sciatic pain on left---no other sig issues No suspicious skin lesions     Objective:   Physical Exam Constitutional:      Appearance: Normal appearance.  HENT:     Mouth/Throat:     Pharynx: No oropharyngeal exudate or posterior oropharyngeal erythema.  Eyes:     Conjunctiva/sclera: Conjunctivae normal.     Pupils: Pupils are equal, round, and reactive to light.  Cardiovascular:     Rate and Rhythm: Normal rate and regular rhythm.     Pulses: Normal pulses.     Heart sounds: No murmur heard.    No gallop.  Pulmonary:     Effort: Pulmonary effort is normal.     Breath sounds: Normal breath sounds. No wheezing or rales.  Abdominal:     Palpations: Abdomen is soft.     Tenderness: There is no abdominal tenderness.  Musculoskeletal:     Cervical back: Neck supple.     Right lower leg: No edema.     Left lower leg: No edema.  Lymphadenopathy:     Cervical: No cervical adenopathy.  Skin:    Findings: No rash.  Neurological:     General: No focal deficit present.     Mental Status: She is alert  and oriented to person, place, and time.  Psychiatric:        Mood and Affect: Mood normal.        Behavior: Behavior normal.            Assessment & Plan:

## 2023-12-18 NOTE — Assessment & Plan Note (Signed)
 BP Readings from Last 3 Encounters:  12/18/23 130/84  01/20/23 115/62  12/16/22 122/80   Controlled with hydrochlorothiazide  25mg  and losartan  100 Blood work okay at [facility]

## 2023-12-18 NOTE — Assessment & Plan Note (Signed)
 Uses low dose gabapentin 

## 2023-12-21 ENCOUNTER — Encounter: Payer: Self-pay | Admitting: Internal Medicine

## 2023-12-21 MED ORDER — HYDROCHLOROTHIAZIDE 25 MG PO TABS: ORAL_TABLET | ORAL | 1 refills | Status: AC

## 2023-12-21 MED ORDER — LOSARTAN POTASSIUM 100 MG PO TABS: ORAL_TABLET | ORAL | 1 refills | Status: AC

## 2023-12-21 MED ORDER — GABAPENTIN 100 MG PO CAPS: 100.0000 mg | ORAL_CAPSULE | Freq: Every day | ORAL | 0 refills | Status: AC

## 2023-12-21 MED ORDER — BUPROPION HCL ER (SR) 200 MG PO TB12: 200.0000 mg | ORAL_TABLET | Freq: Two times a day (BID) | ORAL | 3 refills | Status: AC

## 2023-12-21 NOTE — Telephone Encounter (Signed)
 Copied from CRM #[id number]. Topic: Clinical - Medication Question >> Dec 21, 2023 10:10 AM Revonda D wrote: Reason for CRM: Pt is requesting to speak with Nurse Clotilda in regards to medications being refilled before Dr.Letvak leaves the office. Pt stated that Nurse Clotilda informed her to reach out to discuss if she needed anything refilled, pt is not sure what is needing to be refilled so she would like a callback today if possible. Pt stated that her new pt appt with the new provider is not until February.

## 2024-01-12 ENCOUNTER — Encounter: Payer: Self-pay | Admitting: Internal Medicine

## 2024-01-12 ENCOUNTER — Telehealth: Payer: Self-pay

## 2024-01-12 NOTE — Telephone Encounter (Signed)
 Patient is going to call them back to clarify what they need. She will give us  a call back with that information. Okay to get information from patient when she calls back.

## 2024-01-12 NOTE — Telephone Encounter (Signed)
 Do they need an actual order or just a verbal okay?

## 2024-01-12 NOTE — Telephone Encounter (Signed)
 Copied from CRM [id number]. Topic: Clinical - Request for Lab/Test Order >> Jan 12, 2024  2:28 PM [name] wrote: Reason for CRM: Patient will be having a mammogram soon but the mobile clinic stated that they need the authorization from the PCP she will have. Patient is set for a new office visit on 06/16/2024 with [facility]

## 2024-01-13 ENCOUNTER — Telehealth: Payer: Self-pay

## 2024-01-13 DIAGNOSIS — Z1231 Encounter for screening mammogram for malignant neoplasm of breast: Secondary | ICD-10-CM

## 2024-01-13 NOTE — Telephone Encounter (Signed)
 Copied from CRM [id number]. Topic: Clinical - Medical Advice >> Jan 13, 2024 11:19 AM Gustabo D wrote: Pt needs her yearly mammogram- orders sent [facility] in [facility]

## 2024-01-13 NOTE — Telephone Encounter (Signed)
 Patient advised.

## 2024-01-13 NOTE — Telephone Encounter (Signed)
 Mammogram ordered signed, pt can now call and schedule mammogram

## 2024-02-12 ENCOUNTER — Ambulatory Visit
Admission: RE | Admit: 2024-02-12 | Discharge: 2024-02-12 | Disposition: A | Discharge status: Home / Self Care | Source: Ambulatory Visit | Attending: Internal Medicine | Admitting: Internal Medicine

## 2024-02-12 DIAGNOSIS — Z1231 Encounter for screening mammogram for malignant neoplasm of breast: Secondary | ICD-10-CM | POA: Insufficient documentation

## 2024-06-16 ENCOUNTER — Encounter: Admitting: Internal Medicine

## 2024-12-20 ENCOUNTER — Ambulatory Visit
